# Patient Record
Sex: Female | Born: 1985 | Race: White | Hispanic: No | Marital: Married | Smoking: Former smoker
Health system: Southern US, Community
[De-identification: ages and names within clinical notes are randomized; demographics above are authoritative.]

## PROBLEM LIST (undated history)

## (undated) DIAGNOSIS — J0301 Acute recurrent streptococcal tonsillitis: Secondary | ICD-10-CM

## (undated) DIAGNOSIS — M1990 Unspecified osteoarthritis, unspecified site: Secondary | ICD-10-CM

## (undated) DIAGNOSIS — R102 Pelvic and perineal pain: Secondary | ICD-10-CM

## (undated) DIAGNOSIS — F419 Anxiety disorder, unspecified: Secondary | ICD-10-CM

## (undated) DIAGNOSIS — N200 Calculus of kidney: Secondary | ICD-10-CM

## (undated) HISTORY — DX: Calculus of kidney: N20.0

## (undated) HISTORY — DX: Anxiety disorder, unspecified: F41.9

## (undated) HISTORY — PX: WISDOM TOOTH EXTRACTION: SHX21

## (undated) HISTORY — DX: Acute recurrent streptococcal tonsillitis: J03.01

## (undated) HISTORY — DX: Unspecified osteoarthritis, unspecified site: M19.90

## (undated) HISTORY — DX: Pelvic and perineal pain: R10.2

---

## 2009-06-27 ENCOUNTER — Emergency Department (HOSPITAL_COMMUNITY): Admission: EM | Admit: 2009-06-27 | Discharge: 2009-06-27 | Payer: Self-pay | Admitting: Emergency Medicine

## 2009-08-28 ENCOUNTER — Emergency Department (HOSPITAL_COMMUNITY): Admission: EM | Admit: 2009-08-28 | Discharge: 2009-08-28 | Payer: Self-pay | Admitting: Family Medicine

## 2013-05-01 ENCOUNTER — Ambulatory Visit (INDEPENDENT_AMBULATORY_CARE_PROVIDER_SITE_OTHER): Payer: 59 | Admitting: Internal Medicine

## 2013-05-01 ENCOUNTER — Encounter: Payer: Self-pay | Admitting: Internal Medicine

## 2013-05-01 VITALS — BP 112/72 | HR 72 | Temp 99.0°F | Ht 66.0 in | Wt 146.0 lb

## 2013-05-01 DIAGNOSIS — Z0000 Encounter for general adult medical examination without abnormal findings: Secondary | ICD-10-CM

## 2013-05-01 DIAGNOSIS — N949 Unspecified condition associated with female genital organs and menstrual cycle: Secondary | ICD-10-CM

## 2013-05-01 DIAGNOSIS — R102 Pelvic and perineal pain unspecified side: Secondary | ICD-10-CM | POA: Insufficient documentation

## 2013-05-01 DIAGNOSIS — J020 Streptococcal pharyngitis: Secondary | ICD-10-CM | POA: Insufficient documentation

## 2013-05-01 MED ORDER — PENICILLIN V POTASSIUM 500 MG PO TABS: 500.0000 mg | ORAL_TABLET | Freq: Three times a day (TID) | ORAL | Status: DC

## 2013-05-01 MED ORDER — LEVONORGEST-ETH ESTRAD 91-DAY 0.15-0.03 &0.01 MG PO TABS: 1.0000 | ORAL_TABLET | Freq: Every day | ORAL | Status: DC

## 2013-05-01 MED ORDER — FLUCONAZOLE 150 MG PO TABS: 150.0000 mg | ORAL_TABLET | Freq: Every day | ORAL | Status: DC

## 2013-05-01 NOTE — Assessment & Plan Note (Addendum)
Symptoms and exam are consistent with recurrent strep pharyngitis. Will treat with penicillin. Patient reports frequent vaginal yeast infection after use of penicillin. Will treat with Diflucan as needed. Pt will call if symptoms are not improving.

## 2013-05-01 NOTE — Progress Notes (Signed)
Subjective:    Patient ID: Mackenzie Crawford, female    DOB: Nov 28, 1985, 27 y.o.   MRN: [id number]  HPI 27 year old female presents to establish care. She reports she is generally been healthy. She has had a long history of recurrent episodes of strep pharyngitis. Over the last couple of days she has developed sore throat, exudate, and tender lymph nodes in her neck. She denies any fever or chills. She denies nasal congestion, cough, headache. She has not taken any medication for this.  She also reports intermittent episodes of pelvic pain. Pelvic pain is described as intermittent shooting pain on both sides of her pelvis. It occurs right before her menses. It does not feel like menstrual cramps. She was evaluated by OB/GYN and reports having pelvic ultrasound which was normal. She was told her pain might be related to an ovarian cyst. She had been on oral contraceptives in the past but reports increased moodiness on this medication. She would like to try them again to see if any improvement in pelvic pain. She reports that her menstrual cycles are very heavy with large clots and heavy bleeding. Her periods are regular.  Outpatient Encounter Prescriptions as of 05/01/2013  Medication Sig Dispense Refill   No facility-administered encounter medications on file as of 05/01/2013.   BP 112/72  Pulse 72  Temp(Src) 99 F (37.2 C) (Oral)  Ht 5\' 6"  (1.676 m)  Wt 146 lb (66.225 kg)  BMI 23.58 kg/m2  SpO2 98%  LMP 04/26/2013  Review of Systems  Constitutional: Negative for fever, chills, appetite change, fatigue and unexpected weight change.  HENT: Positive for sore throat. Negative for ear pain, congestion, trouble swallowing, neck pain, voice change and sinus pressure.   Eyes: Negative for visual disturbance.  Respiratory: Negative for cough, shortness of breath, wheezing and stridor.   Cardiovascular: Negative for chest pain, palpitations and leg swelling.  Gastrointestinal: Negative for nausea,  vomiting, abdominal pain, diarrhea, constipation, blood in stool, abdominal distention and anal bleeding.  Genitourinary: Positive for menstrual problem and pelvic pain. Negative for dysuria, urgency, frequency, hematuria, flank pain, vaginal bleeding, vaginal discharge, genital sores and vaginal pain.  Musculoskeletal: Negative for myalgias, arthralgias and gait problem.  Skin: Negative for color change and rash.  Neurological: Negative for dizziness and headaches.  Hematological: Positive for adenopathy. Does not bruise/bleed easily.  Psychiatric/Behavioral: Negative for suicidal ideas, sleep disturbance and dysphoric mood. The patient is not nervous/anxious.        Objective:   Physical Exam  Constitutional: She is oriented to person, place, and time. She appears well-developed and well-nourished. No distress.  HENT:  Head: Normocephalic and atraumatic.  Right Ear: External ear normal.  Left Ear: External ear normal.  Nose: Nose normal.  Mouth/Throat: Mucous membranes are normal. Oropharyngeal exudate and posterior oropharyngeal erythema present. No tonsillar abscesses (tonsilar hypertrophy).  Eyes: Conjunctivae are normal. Pupils are equal, round, and reactive to light. Right eye exhibits no discharge. Left eye exhibits no discharge. No scleral icterus.  Neck: Normal range of motion. Neck supple. No tracheal deviation present. No thyromegaly present.  Cardiovascular: Normal rate, regular rhythm, normal heart sounds and intact distal pulses.  Exam reveals no gallop and no friction rub.   No murmur heard. Pulmonary/Chest: Effort normal and breath sounds normal. No accessory muscle usage. Not tachypneic. No respiratory distress. She has no decreased breath sounds. She has no wheezes. She has no rhonchi. She has no rales. She exhibits no tenderness.  Abdominal: Soft. Bowel sounds are normal. She  exhibits no distension and no mass. There is no tenderness. There is no rebound and no guarding.   Musculoskeletal: Normal range of motion. She exhibits no edema and no tenderness.  Lymphadenopathy:    She has cervical adenopathy (tender to palpation bilaterally).       Right cervical: Superficial cervical adenopathy present.       Left cervical: Superficial cervical adenopathy present.  Neurological: She is alert and oriented to person, place, and time. No cranial nerve deficit. She exhibits normal muscle tone. Coordination normal.  Skin: Skin is warm and dry. No rash noted. She is not diaphoretic. No erythema. No pallor.  Psychiatric: She has a normal mood and affect. Her behavior is normal. Judgment and thought content normal.          Assessment & Plan:   Outpatient Encounter Prescriptions as of 05/01/2013  Medication Sig Dispense Refill  . fluconazole (DIFLUCAN) 150 MG tablet Take 1 tablet (150 mg total) by mouth daily.  3 tablet  0  . Levonorgestrel-Ethinyl Estradiol (AMETHIA,CAMRESE) 0.15-0.03 &0.01 MG tablet Take 1 tablet by mouth daily.  1 Package  4  . penicillin v potassium (VEETID) 500 MG tablet Take 1 tablet (500 mg total) by mouth 3 (three) times daily.  30 tablet  0   No facility-administered encounter medications on file as of 05/01/2013.

## 2013-05-01 NOTE — Assessment & Plan Note (Signed)
Pelvic pain and heavy menstrual bleeding are concerning for endometriosis. Symptoms as described are not consistent with ovarian cyst. Will request records on previous pelvic ultrasound. Will start oral contraceptive. Patient will call if any recurrent pelvic pain. She will also call if persistent menorrhagia. Will check CBC with labs. Followup 4 weeks.

## 2013-05-05 ENCOUNTER — Other Ambulatory Visit (INDEPENDENT_AMBULATORY_CARE_PROVIDER_SITE_OTHER): Payer: 59

## 2013-05-05 ENCOUNTER — Encounter: Payer: Self-pay | Admitting: *Deleted

## 2013-05-05 DIAGNOSIS — Z0000 Encounter for general adult medical examination without abnormal findings: Secondary | ICD-10-CM

## 2013-05-05 LAB — CBC WITH DIFFERENTIAL/PLATELET
Eosinophils Relative: 5.1 % — ABNORMAL HIGH (ref 0.0–5.0)
HCT: 40.7 % (ref 36.0–46.0)
Hemoglobin: 13.6 g/dL (ref 12.0–15.0)
Lymphs Abs: 2.3 10*3/uL (ref 0.7–4.0)
MCV: 88.9 fl (ref 78.0–100.0)
Monocytes Absolute: 0.5 10*3/uL (ref 0.1–1.0)
Neutro Abs: 3.1 10*3/uL (ref 1.4–7.7)
Platelets: 213 10*3/uL (ref 150.0–400.0)
RDW: 13.8 % (ref 11.5–14.6)
WBC: 6.3 10*3/uL (ref 4.5–10.5)

## 2013-05-05 LAB — COMPREHENSIVE METABOLIC PANEL
Alkaline Phosphatase: 49 U/L (ref 39–117)
BUN: 16 mg/dL (ref 6–23)
Glucose, Bld: 86 mg/dL (ref 70–99)
Total Bilirubin: 0.7 mg/dL (ref 0.3–1.2)

## 2013-05-05 LAB — LIPID PANEL
HDL: 79.9 mg/dL (ref >39.00)
Triglycerides: 69 mg/dL (ref 0.0–149.0)

## 2013-05-05 LAB — TSH: TSH: 2.91 u[IU]/mL (ref 0.35–5.50)

## 2013-05-06 LAB — VITAMIN D 25 HYDROXY (VIT D DEFICIENCY, FRACTURES): Vit D, 25-Hydroxy: 70 ng/mL (ref 30–89)

## 2013-06-26 ENCOUNTER — Encounter: Payer: Self-pay | Admitting: Internal Medicine

## 2013-07-08 ENCOUNTER — Encounter: Payer: Self-pay | Admitting: Internal Medicine

## 2013-07-08 MED ORDER — ETONOGESTREL-ETHINYL ESTRADIOL 0.12-0.015 MG/24HR VA RING: VAGINAL_RING | VAGINAL | Status: DC

## 2013-08-28 ENCOUNTER — Other Ambulatory Visit: Payer: Self-pay

## 2013-09-03 ENCOUNTER — Other Ambulatory Visit (HOSPITAL_COMMUNITY)
Admission: RE | Admit: 2013-09-03 | Discharge: 2013-09-03 | Disposition: A | Discharge status: Home / Self Care | Payer: 59 | Source: Ambulatory Visit | Attending: Obstetrics and Gynecology | Admitting: Obstetrics and Gynecology

## 2013-09-03 ENCOUNTER — Other Ambulatory Visit: Payer: Self-pay | Admitting: Obstetrics and Gynecology

## 2013-09-03 DIAGNOSIS — Z01419 Encounter for gynecological examination (general) (routine) without abnormal findings: Secondary | ICD-10-CM | POA: Insufficient documentation

## 2013-09-03 DIAGNOSIS — Z113 Encounter for screening for infections with a predominantly sexual mode of transmission: Secondary | ICD-10-CM | POA: Insufficient documentation

## 2013-10-23 HISTORY — PX: TONSILLECTOMY AND ADENOIDECTOMY: SUR1326

## 2013-11-06 ENCOUNTER — Other Ambulatory Visit: Payer: Self-pay | Admitting: Otolaryngology

## 2014-06-19 ENCOUNTER — Encounter: Payer: Self-pay | Admitting: Internal Medicine

## 2014-06-19 ENCOUNTER — Ambulatory Visit (INDEPENDENT_AMBULATORY_CARE_PROVIDER_SITE_OTHER): Payer: 59 | Admitting: Internal Medicine

## 2014-06-19 VITALS — BP 122/66 | HR 72 | Temp 98.5°F | Ht 66.0 in | Wt 146.8 lb

## 2014-06-19 DIAGNOSIS — F410 Panic disorder [episodic paroxysmal anxiety] without agoraphobia: Secondary | ICD-10-CM

## 2014-06-19 MED ORDER — IBUPROFEN 800 MG PO TABS
800.0000 mg | ORAL_TABLET | Freq: Three times a day (TID) | ORAL | Status: DC | PRN
Start: 2014-06-19 — End: 2016-03-27

## 2014-06-19 MED ORDER — ALPRAZOLAM 0.25 MG PO TABS: 0.2500 mg | ORAL_TABLET | Freq: Three times a day (TID) | ORAL | Status: DC | PRN

## 2014-06-19 NOTE — Progress Notes (Signed)
   Subjective:    Patient ID: Mackenzie Crawford, female    DOB: 11-23-1985, 28 y.o.   MRN: [id number]  HPI 28YO female presents for acute visit.  Anxiety - Over last 1 month or so. Leads to shortness of breath at times with heart racing. No previous history of anxiety or panic. Her mother has history of panic attacks. She has never taken medications for this.  Review of Systems  Constitutional: Negative for fever, chills, appetite change, fatigue and unexpected weight change.  Eyes: Negative for visual disturbance.  Respiratory: Positive for shortness of breath.   Cardiovascular: Positive for chest pain and palpitations. Negative for leg swelling.  Gastrointestinal: Negative for abdominal pain.  Skin: Negative for color change and rash.  Hematological: Negative for adenopathy. Does not bruise/bleed easily.  Psychiatric/Behavioral: Negative for suicidal ideas, sleep disturbance and dysphoric mood. The patient is nervous/anxious.        Objective:    BP 122/66  Pulse 72  Temp(Src) 98.5 F (36.9 C) (Oral)  Ht 5\' 6"  (1.676 m)  Wt 146 lb 12 oz (66.565 kg)  BMI 23.70 kg/m2  SpO2 98%  LMP 06/03/2014 Physical Exam  Constitutional: She is oriented to person, place, and time. She appears well-developed and well-nourished. No distress.  HENT:  Head: Normocephalic and atraumatic.  Right Ear: External ear normal.  Left Ear: External ear normal.  Nose: Nose normal.  Mouth/Throat: Oropharynx is clear and moist. No oropharyngeal exudate.  Eyes: Conjunctivae are normal. Pupils are equal, round, and reactive to light. Right eye exhibits no discharge. Left eye exhibits no discharge. No scleral icterus.  Neck: Normal range of motion. Neck supple. No tracheal deviation present. No thyromegaly present.  Cardiovascular: Normal rate, regular rhythm, normal heart sounds and intact distal pulses.  Exam reveals no gallop and no friction rub.   No murmur heard. Pulmonary/Chest: Effort normal and breath  sounds normal. No accessory muscle usage. Not tachypneic. No respiratory distress. She has no decreased breath sounds. She has no wheezes. She has no rhonchi. She has no rales. She exhibits no tenderness.  Musculoskeletal: Normal range of motion. She exhibits no edema and no tenderness.  Lymphadenopathy:    She has no cervical adenopathy.  Neurological: She is alert and oriented to person, place, and time. No cranial nerve deficit. She exhibits normal muscle tone. Coordination normal.  Skin: Skin is warm and dry. No rash noted. She is not diaphoretic. No erythema. No pallor.  Psychiatric: Her behavior is normal. Judgment and thought content normal. Her mood appears anxious.          Assessment & Plan:   Problem List Items Addressed This Visit     Unprioritized   Panic disorder - Primary     Symptoms are most consistent with panic disorder. We discussed options including SSRIs and benzodiazepines. We discussed risks and benefits of both. She does not feel that she can take a daily medication, would rather take a prn med. Will start prn Alprazolam. She will stay home from work today.  Follow up by email this afternoon and next week. Follow up visit in 4 weeks.  Over 9min of which >50% spent in face-to-face contact with patient discussing plan of care     Relevant Medications      ALPRAZolam  Duanne Moron) tablet   Other Relevant Orders      EKG 12-Lead       Return in about 4 weeks (around 07/17/2014) for Recheck.

## 2014-06-19 NOTE — Patient Instructions (Addendum)
Email later today or tomorrow with update on medication.  Panic Attacks Panic attacks are sudden, short-livedsurges of severe anxiety, fear, or discomfort. They may occur for no reason when you are relaxed, when you are anxious, or when you are sleeping. Panic attacks may occur for a number of reasons:   Healthy people occasionally have panic attacks in extreme, life-threatening situations, such as war or natural disasters. Normal anxiety is a protective mechanism of the body that helps [place] react to danger (fight or flight response).  Panic attacks are often seen with anxiety disorders, such as panic disorder, social anxiety disorder, generalized anxiety disorder, and phobias. Anxiety disorders cause excessive or uncontrollable anxiety. They may interfere with your relationships or other life activities.  Panic attacks are sometimes seen with other mental illnesses, such as depression and posttraumatic stress disorder.  Certain medical conditions, prescription medicines, and drugs of abuse can cause panic attacks. SYMPTOMS  Panic attacks start suddenly, peak within 20 minutes, and are accompanied by four or more of the following symptoms:  Pounding heart or fast heart rate (palpitations).  Sweating.  Trembling or shaking.  Shortness of breath or feeling smothered.  Feeling choked.  Chest pain or discomfort.  Nausea or strange feeling in your stomach.  Dizziness, light-headedness, or feeling like you will faint.  Chills or hot flushes.  Numbness or tingling in your lips or hands and feet.  Feeling that things are not real or feeling that you are not yourself.  Fear of losing control or going crazy.  Fear of dying. Some of these symptoms can mimic serious medical conditions. For example, you may think you are having a heart attack. Although panic attacks can be very scary, they are not life threatening. DIAGNOSIS  Panic attacks are diagnosed through an assessment by your  health care provider. Your health care provider will ask questions about your symptoms, such as where and when they occurred. Your health care provider will also ask about your medical history and use of alcohol and drugs, including prescription medicines. Your health care provider may order blood tests or other studies to rule out a serious medical condition. Your health care provider may refer you to a mental health professional for further evaluation. TREATMENT   Most healthy people who have one or two panic attacks in an extreme, life-threatening situation will not require treatment.  The treatment for panic attacks associated with anxiety disorders or other mental illness typically involves counseling with a mental health professional, medicine, or a combination of both. Your health care provider will help determine what treatment is best for you.  Panic attacks due to physical illness usually go away with treatment of the illness. If prescription medicine is causing panic attacks, talk with your health care provider about stopping the medicine, decreasing the dose, or substituting another medicine.  Panic attacks due to alcohol or drug abuse go away with abstinence. Some adults need professional help in order to stop drinking or using drugs. HOME CARE INSTRUCTIONS   Take all medicines as directed by your health care provider.   Schedule and attend follow-up visits as directed by your health care provider. It is important to keep all your appointments. SEEK MEDICAL CARE IF:  You are not able to take your medicines as prescribed.  Your symptoms do not improve or get worse. SEEK IMMEDIATE MEDICAL CARE IF:   You experience panic attack symptoms that are different than your usual symptoms.  You have serious thoughts about hurting yourself or  others.  You are taking medicine for panic attacks and have a serious side effect. MAKE SURE YOU:  Understand these instructions.  Will watch  your condition.  Will get help right away if you are not doing well or get worse. Document Released: 10/09/2005 Document Revised: 10/14/2013 Document Reviewed: 05/23/2013 [facility] Patient Information 2015 [place], [place]. This information is not intended to replace advice given to you by your health care provider. Make sure you discuss any questions you have with your health care provider.

## 2014-06-19 NOTE — Progress Notes (Signed)
Pre visit review using our clinic review tool, if applicable. No additional management support is needed unless otherwise documented below in the visit note. 

## 2014-06-19 NOTE — Assessment & Plan Note (Signed)
Symptoms are most consistent with panic disorder. We discussed options including SSRIs and benzodiazepines. We discussed risks and benefits of both. She does not feel that she can take a daily medication, would rather take a prn med. Will start prn Alprazolam. She will stay home from work today.  Follow up by email this afternoon and next week. Follow up visit in 4 weeks.  Over 57min of which >50% spent in face-to-face contact with patient discussing plan of care

## 2014-06-22 ENCOUNTER — Encounter: Payer: Self-pay | Admitting: Internal Medicine

## 2014-06-22 MED ORDER — SERTRALINE HCL 25 MG PO TABS: 25.0000 mg | ORAL_TABLET | Freq: Every day | ORAL | Status: DC

## 2014-06-22 NOTE — Telephone Encounter (Signed)
Follow up.

## 2014-07-01 ENCOUNTER — Encounter: Payer: Self-pay | Admitting: Internal Medicine

## 2014-07-22 ENCOUNTER — Ambulatory Visit (INDEPENDENT_AMBULATORY_CARE_PROVIDER_SITE_OTHER): Payer: 59 | Admitting: Internal Medicine

## 2014-07-22 ENCOUNTER — Encounter: Payer: Self-pay | Admitting: Internal Medicine

## 2014-07-22 VITALS — BP 120/80 | HR 77 | Temp 98.4°F | Ht 66.0 in | Wt 143.8 lb

## 2014-07-22 DIAGNOSIS — F410 Panic disorder [episodic paroxysmal anxiety] without agoraphobia: Secondary | ICD-10-CM

## 2014-07-22 NOTE — Progress Notes (Signed)
Pre visit review using our clinic review tool, if applicable. No additional management support is needed unless otherwise documented below in the visit note. 

## 2014-07-22 NOTE — Progress Notes (Signed)
   Subjective:    Patient ID: Mackenzie Crawford, female    DOB: Mar 10, 1986, 28 y.o.   MRN: [id number]  HPI 28YO female presents for follow up.  Recently started on prn Alprazolam and daily Sertraline 25mg  for anxiety. No improvement noted with the Sertraline, however does note improvement with use of Alprazolam. Taking prn alprazolam during day for panic attacks and at bedtime to help with sleep. Notes improvement in sleep. No daytime drowsiness. No new concerns today.  Review of Systems  Constitutional: Negative for fever, chills, appetite change, fatigue and unexpected weight change.  Eyes: Negative for visual disturbance.  Respiratory: Negative for shortness of breath.   Cardiovascular: Negative for chest pain and leg swelling.  Gastrointestinal: Negative for abdominal pain.  Skin: Negative for color change and rash.  Hematological: Negative for adenopathy. Does not bruise/bleed easily.  Psychiatric/Behavioral: Negative for sleep disturbance and dysphoric mood. The patient is nervous/anxious.        Objective:    BP 120/80  Pulse 77  Temp(Src) 98.4 F (36.9 C) (Oral)  Ht 5\' 6"  (1.676 m)  Wt 143 lb 12 oz (65.205 kg)  BMI 23.21 kg/m2  SpO2 97%  LMP 06/29/2014 Physical Exam  Constitutional: She is oriented to person, place, and time. She appears well-developed and well-nourished. No distress.  HENT:  Head: Normocephalic and atraumatic.  Right Ear: External ear normal.  Left Ear: External ear normal.  Nose: Nose normal.  Mouth/Throat: Oropharynx is clear and moist. No oropharyngeal exudate.  Eyes: Conjunctivae are normal. Pupils are equal, round, and reactive to light. Right eye exhibits no discharge. Left eye exhibits no discharge. No scleral icterus.  Neck: Normal range of motion. Neck supple. No tracheal deviation present. No thyromegaly present.  Cardiovascular: Normal rate, regular rhythm, normal heart sounds and intact distal pulses.  Exam reveals no gallop and no friction  rub.   No murmur heard. Pulmonary/Chest: Effort normal and breath sounds normal. No accessory muscle usage. Not tachypneic. No respiratory distress. She has no decreased breath sounds. She has no wheezes. She has no rhonchi. She has no rales. She exhibits no tenderness.  Musculoskeletal: Normal range of motion. She exhibits no edema and no tenderness.  Lymphadenopathy:    She has no cervical adenopathy.  Neurological: She is alert and oriented to person, place, and time. No cranial nerve deficit. She exhibits normal muscle tone. Coordination normal.  Skin: Skin is warm and dry. No rash noted. She is not diaphoretic. No erythema. No pallor.  Psychiatric: She has a normal mood and affect. Her speech is normal and behavior is normal. Judgment and thought content normal. Her mood appears not anxious. She does not exhibit a depressed mood.          Assessment & Plan:   Problem List Items Addressed This Visit     Unprioritized   Panic disorder - Primary     Symptoms improved with prn Alprazolam. She prefers not to take a daily medication, so will stop Sertraline. Follow up in 87months or sooner as needed.        Return in about 3 months (around 10/21/2014) for Recheck.

## 2014-07-22 NOTE — Patient Instructions (Signed)
Continue Alprazolam as needed.  Follow up in 3 months or sooner as needed.

## 2014-07-22 NOTE — Assessment & Plan Note (Signed)
Symptoms improved with prn Alprazolam. She prefers not to take a daily medication, so will stop Sertraline. Follow up in 40months or sooner as needed.

## 2014-08-03 ENCOUNTER — Encounter: Payer: Self-pay | Admitting: Internal Medicine

## 2014-08-03 MED ORDER — ETONOGESTREL-ETHINYL ESTRADIOL 0.12-0.015 MG/24HR VA RING: VAGINAL_RING | VAGINAL | Status: DC

## 2014-09-02 ENCOUNTER — Other Ambulatory Visit: Payer: Self-pay | Admitting: Obstetrics and Gynecology

## 2014-09-02 ENCOUNTER — Other Ambulatory Visit (HOSPITAL_COMMUNITY)
Admission: RE | Admit: 2014-09-02 | Discharge: 2014-09-02 | Disposition: A | Discharge status: Home / Self Care | Payer: 59 | Source: Ambulatory Visit | Attending: Obstetrics and Gynecology | Admitting: Obstetrics and Gynecology

## 2014-09-02 DIAGNOSIS — Z01419 Encounter for gynecological examination (general) (routine) without abnormal findings: Secondary | ICD-10-CM | POA: Insufficient documentation

## 2014-09-03 LAB — CYTOLOGY - PAP

## 2014-09-14 ENCOUNTER — Encounter: Payer: Self-pay | Admitting: Internal Medicine

## 2014-10-07 ENCOUNTER — Ambulatory Visit: Payer: Self-pay | Admitting: Internal Medicine

## 2014-10-27 ENCOUNTER — Encounter: Payer: Self-pay | Admitting: Internal Medicine

## 2015-07-27 ENCOUNTER — Encounter: Payer: Self-pay | Admitting: Internal Medicine

## 2015-07-28 ENCOUNTER — Telehealth: Payer: Self-pay | Admitting: Internal Medicine

## 2015-07-28 NOTE — Telephone Encounter (Signed)
Good morning! Dr Gilford Rile. I received your message to schedule Ms Mooty, I do not see anything available to schedule. Can you please let me know where I can schedule her? Pt did not answer the phone I left a vm for her to call me. Thank You!

## 2015-07-28 NOTE — Telephone Encounter (Signed)
OK. Thank You! Pt scheduled.

## 2015-07-28 NOTE — Telephone Encounter (Signed)
Friday at 1pm for 32min

## 2015-07-30 ENCOUNTER — Ambulatory Visit: Payer: Self-pay | Admitting: Internal Medicine

## 2015-09-09 ENCOUNTER — Other Ambulatory Visit: Payer: Self-pay | Admitting: Nurse Practitioner

## 2015-09-09 ENCOUNTER — Other Ambulatory Visit (HOSPITAL_COMMUNITY)
Admission: RE | Admit: 2015-09-09 | Discharge: 2015-09-09 | Disposition: A | Discharge status: Home / Self Care | Payer: 59 | Source: Ambulatory Visit | Attending: Nurse Practitioner | Admitting: Nurse Practitioner

## 2015-09-09 DIAGNOSIS — Z113 Encounter for screening for infections with a predominantly sexual mode of transmission: Secondary | ICD-10-CM | POA: Insufficient documentation

## 2015-09-09 DIAGNOSIS — Z01419 Encounter for gynecological examination (general) (routine) without abnormal findings: Secondary | ICD-10-CM | POA: Insufficient documentation

## 2015-09-13 LAB — CYTOLOGY - PAP

## 2015-10-18 ENCOUNTER — Encounter: Payer: Self-pay | Admitting: Internal Medicine

## 2015-10-19 ENCOUNTER — Other Ambulatory Visit: Payer: Self-pay | Admitting: Internal Medicine

## 2015-10-19 NOTE — Telephone Encounter (Signed)
Please advise? The last time patient was seen was in September 2015.

## 2016-03-27 ENCOUNTER — Encounter: Payer: Self-pay | Admitting: Internal Medicine

## 2016-03-27 ENCOUNTER — Ambulatory Visit: Payer: Self-pay | Admitting: Internal Medicine

## 2016-03-27 ENCOUNTER — Ambulatory Visit (INDEPENDENT_AMBULATORY_CARE_PROVIDER_SITE_OTHER)
Admission: RE | Admit: 2016-03-27 | Discharge: 2016-03-27 | Disposition: A | Discharge status: Home / Self Care | Payer: Managed Care, Other (non HMO) | Source: Ambulatory Visit | Attending: Internal Medicine | Admitting: Internal Medicine

## 2016-03-27 ENCOUNTER — Ambulatory Visit (INDEPENDENT_AMBULATORY_CARE_PROVIDER_SITE_OTHER): Payer: Managed Care, Other (non HMO) | Admitting: Internal Medicine

## 2016-03-27 VITALS — BP 122/78 | HR 66 | Ht 65.5 in | Wt 150.4 lb

## 2016-03-27 DIAGNOSIS — Z0001 Encounter for general adult medical examination with abnormal findings: Secondary | ICD-10-CM

## 2016-03-27 DIAGNOSIS — M25552 Pain in left hip: Secondary | ICD-10-CM | POA: Insufficient documentation

## 2016-03-27 DIAGNOSIS — Z0000 Encounter for general adult medical examination without abnormal findings: Secondary | ICD-10-CM | POA: Diagnosis not present

## 2016-03-27 DIAGNOSIS — S43005S Unspecified dislocation of left shoulder joint, sequela: Secondary | ICD-10-CM | POA: Diagnosis not present

## 2016-03-27 DIAGNOSIS — S43006A Unspecified dislocation of unspecified shoulder joint, initial encounter: Secondary | ICD-10-CM | POA: Insufficient documentation

## 2016-03-27 DIAGNOSIS — Z3200 Encounter for pregnancy test, result unknown: Secondary | ICD-10-CM

## 2016-03-27 LAB — COMPREHENSIVE METABOLIC PANEL
ALBUMIN: 4.5 g/dL (ref 3.5–5.2)
ALK PHOS: 58 U/L (ref 39–117)
ALT: 16 U/L (ref 0–35)
AST: 22 U/L (ref 0–37)
BUN: 14 mg/dL (ref 6–23)
CALCIUM: 9.4 mg/dL (ref 8.4–10.5)
CO2: 28 mEq/L (ref 19–32)
CREATININE: 0.79 mg/dL (ref 0.40–1.20)
Chloride: 103 mEq/L (ref 96–112)
GFR: 92.16 mL/min (ref >60.00)
Glucose, Bld: 95 mg/dL (ref 70–99)
POTASSIUM: 4.3 meq/L (ref 3.5–5.1)
SODIUM: 138 meq/L (ref 135–145)
TOTAL PROTEIN: 7 g/dL (ref 6.0–8.3)
Total Bilirubin: 0.5 mg/dL (ref 0.2–1.2)

## 2016-03-27 LAB — VITAMIN D 25 HYDROXY (VIT D DEFICIENCY, FRACTURES): VITD: 50.16 ng/mL (ref 30.00–100.00)

## 2016-03-27 LAB — LIPID PANEL
CHOLESTEROL: 166 mg/dL (ref 0–200)
HDL: 78.6 mg/dL (ref >39.00)
LDL Cholesterol: 76 mg/dL (ref 0–99)
NonHDL: 86.97
TRIGLYCERIDES: 57 mg/dL (ref 0.0–149.0)
Total CHOL/HDL Ratio: 2
VLDL: 11.4 mg/dL (ref 0.0–40.0)

## 2016-03-27 LAB — CBC WITH DIFFERENTIAL/PLATELET
BASOS PCT: 0.7 % (ref 0.0–3.0)
Basophils Absolute: 0 10*3/uL (ref 0.0–0.1)
EOS PCT: 4.3 % (ref 0.0–5.0)
Eosinophils Absolute: 0.3 10*3/uL (ref 0.0–0.7)
HEMATOCRIT: 38.8 % (ref 36.0–46.0)
HEMOGLOBIN: 12.8 g/dL (ref 12.0–15.0)
LYMPHS PCT: 36.5 % (ref 12.0–46.0)
Lymphs Abs: 2.3 10*3/uL (ref 0.7–4.0)
MCHC: 33 g/dL (ref 30.0–36.0)
MCV: 85.6 fl (ref 78.0–100.0)
MONO ABS: 0.6 10*3/uL (ref 0.1–1.0)
Monocytes Relative: 8.9 % (ref 3.0–12.0)
Neutro Abs: 3.1 10*3/uL (ref 1.4–7.7)
Neutrophils Relative %: 49.6 % (ref 43.0–77.0)
Platelets: 239 10*3/uL (ref 150.0–400.0)
RBC: 4.53 Mil/uL (ref 3.87–5.11)
RDW: 14.1 % (ref 11.5–15.5)
WBC: 6.2 10*3/uL (ref 4.0–10.5)

## 2016-03-27 LAB — TSH: TSH: 1.29 u[IU]/mL (ref 0.35–4.50)

## 2016-03-27 LAB — POCT URINE PREGNANCY: Preg Test, Ur: NEGATIVE

## 2016-03-27 MED ORDER — IBUPROFEN 800 MG PO TABS: 800.0000 mg | ORAL_TABLET | Freq: Three times a day (TID) | ORAL | Status: DC | PRN

## 2016-03-27 NOTE — Assessment & Plan Note (Signed)
Intermittent dislocation of left shoulder. Will set up ortho evaluation.

## 2016-03-27 NOTE — Progress Notes (Signed)
Pre visit review using our clinic review tool, if applicable. No additional management support is needed unless otherwise documented below in the visit note. 

## 2016-03-27 NOTE — Assessment & Plan Note (Signed)
Posterior left hip pain after accident several years ago. Will get plain xray of left hip. Consider ortho evaluation if persistent pain.
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
## 2016-03-27 NOTE — Patient Instructions (Addendum)
Xray today at [facility].  We will set up ortho evaluation.  Health Maintenance, Female Adopting a healthy lifestyle and getting preventive care can go a long way to promote health and wellness. Talk with your health care provider about what schedule of regular examinations is right for you. This is a good chance for you to check in with your provider about disease prevention and staying healthy. In between checkups, there are plenty of things you can do on your own. Experts have done a lot of research about which lifestyle changes and preventive measures are most likely to keep you healthy. Ask your health care provider for more information. WEIGHT AND DIET  Eat a healthy diet  Be sure to include plenty of vegetables, fruits, low-fat dairy products, and lean protein.  Do not eat a lot of foods high in solid fats, added sugars, or salt.  Get regular exercise. This is one of the most important things you can do for your health.  Most adults should exercise for at least 150 minutes each week. The exercise should increase your heart rate and make you sweat (moderate-intensity exercise).  Most adults should also do strengthening exercises at least twice a week. This is in addition to the moderate-intensity exercise.  Maintain a healthy weight  Body mass index (BMI) is a measurement that can be used to identify possible weight problems. It estimates body fat based on height and weight. Your health care provider can help determine your BMI and help you achieve or maintain a healthy weight.  For females 30 years of age and older:   A BMI below 18.5 is considered underweight.  A BMI of 18.5 to 24.9 is normal.  A BMI of 25 to 29.9 is considered overweight.  A BMI of 30 and above is considered obese.  Watch levels of cholesterol and blood lipids  You should start having your blood tested for lipids and cholesterol at 28 years of age, then have this test every 5 years.  You may need to  have your cholesterol levels checked more often if:  Your lipid or cholesterol levels are high.  You are older than 28 years of age.  You are at high risk for heart disease.  CANCER SCREENING   Lung Cancer  Lung cancer screening is recommended for adults 49-31 years old who are at high risk for lung cancer because of a history of smoking.  A yearly low-dose CT scan of the lungs is recommended for people who:  Currently smoke.  Have quit within the past 15 years.  Have at least a 30-pack-year history of smoking. A pack year is smoking an average of one pack of cigarettes a day for 1 year.  Yearly screening should continue until it has been 15 years since you quit.  Yearly screening should stop if you develop a health problem that would prevent you from having lung cancer treatment.  Breast Cancer  Practice breast self-awareness. This means understanding how your breasts normally appear and feel.  It also means doing regular breast self-exams. Let your health care provider know about any changes, no matter how small.  If you are in your 20s or 30s, you should have a clinical breast exam (CBE) by a health care provider every 1-3 years as part of a regular health exam.  If you are 74 or older, have a CBE every year. Also consider having a breast X-ray (mammogram) every year.  If you have a family history of breast  cancer, talk to your health care provider about genetic screening.  If you are at high risk for breast cancer, talk to your health care provider about having an MRI and a mammogram every year.  Breast cancer gene (BRCA) assessment is recommended for women who have family members with BRCA-related cancers. BRCA-related cancers include:  Breast.  Ovarian.  Tubal.  Peritoneal cancers.  Results of the assessment will determine the need for genetic counseling and BRCA1 and BRCA2 testing. Cervical Cancer Your health care provider may recommend that you be screened  regularly for cancer of the pelvic organs (ovaries, uterus, and vagina). This screening involves a pelvic examination, including checking for microscopic changes to the surface of your cervix (Pap test). You may be encouraged to have this screening done every 3 years, beginning at age 62.  For women ages 67-65, health care providers may recommend pelvic exams and Pap testing every 3 years, or they may recommend the Pap and pelvic exam, combined with testing for human papilloma virus (HPV), every 5 years. Some types of HPV increase your risk of cervical cancer. Testing for HPV may also be done on women of any age with unclear Pap test results.  Other health care providers may not recommend any screening for nonpregnant women who are considered low risk for pelvic cancer and who do not have symptoms. Ask your health care provider if a screening pelvic exam is right for you.  If you have had past treatment for cervical cancer or a condition that could lead to cancer, you need Pap tests and screening for cancer for at least 20 years after your treatment. If Pap tests have been discontinued, your risk factors (such as having a new sexual partner) need to be reassessed to determine if screening should resume. Some women have medical problems that increase the chance of getting cervical cancer. In these cases, your health care provider may recommend more frequent screening and Pap tests. Colorectal Cancer  This type of cancer can be detected and often prevented.  Routine colorectal cancer screening usually begins at 28 years of age and continues through 28 years of age.  Your health care provider may recommend screening at an earlier age if you have risk factors for colon cancer.  Your health care provider may also recommend using home test kits to check for hidden blood in the stool.  A small camera at the end of a tube can be used to examine your colon directly (sigmoidoscopy or colonoscopy). This is  done to check for the earliest forms of colorectal cancer.  Routine screening usually begins at age 53.  Direct examination of the colon should be repeated every 5-10 years through 28 years of age. However, you may need to be screened more often if early forms of precancerous polyps or small growths are found. Skin Cancer  Check your skin from head to toe regularly.  Tell your health care provider about any new moles or changes in moles, especially if there is a change in a mole's shape or color.  Also tell your health care provider if you have a mole that is larger than the size of a pencil eraser.  Always use sunscreen. Apply sunscreen liberally and repeatedly throughout the day.  Protect yourself by wearing long sleeves, pants, a wide-brimmed hat, and sunglasses whenever you are outside. HEART DISEASE, DIABETES, AND HIGH BLOOD PRESSURE   High blood pressure causes heart disease and increases the risk of stroke. High blood pressure is more likely  to develop in:  People who have blood pressure in the high end of the normal range (130-139/85-89 mm Hg).  People who are overweight or obese.  People who are African American.  If you are 61-74 years of age, have your blood pressure checked every 3-5 years. If you are 31 years of age or older, have your blood pressure checked every year. You should have your blood pressure measured twice--once when you are at a hospital or clinic, and once when you are not at a hospital or clinic. Record the average of the two measurements. To check your blood pressure when you are not at a hospital or clinic, you can use:  An automated blood pressure machine at a pharmacy.  A home blood pressure monitor.  If you are between 57 years and 38 years old, ask your health care provider if you should take aspirin to prevent strokes.  Have regular diabetes screenings. This involves taking a blood sample to check your fasting blood sugar level.  If you are at  a normal weight and have a low risk for diabetes, have this test once every three years after 28 years of age.  If you are overweight and have a high risk for diabetes, consider being tested at a younger age or more often. PREVENTING INFECTION  Hepatitis B  If you have a higher risk for hepatitis B, you should be screened for this virus. You are considered at high risk for hepatitis B if:  You were born in a country where hepatitis B is common. Ask your health care provider which countries are considered high risk.  Your parents were born in a high-risk country, and you have not been immunized against hepatitis B (hepatitis B vaccine).  You have HIV or AIDS.  You use needles to inject street drugs.  You live with someone who has hepatitis B.  You have had sex with someone who has hepatitis B.  You get hemodialysis treatment.  You take certain medicines for conditions, including cancer, organ transplantation, and autoimmune conditions. Hepatitis C  Blood testing is recommended for:  Everyone born from [birth year] through 1963.  Anyone with known risk factors for hepatitis C. Sexually transmitted infections (STIs)  You should be screened for sexually transmitted infections (STIs) including gonorrhea and chlamydia if:  You are sexually active and are younger than 28 years of age.  You are older than 28 years of age and your health care provider tells you that you are at risk for this type of infection.  Your sexual activity has changed since you were last screened and you are at an increased risk for chlamydia or gonorrhea. Ask your health care provider if you are at risk.  If you do not have HIV, but are at risk, it may be recommended that you take a prescription medicine daily to prevent HIV infection. This is called pre-exposure prophylaxis (PrEP). You are considered at risk if:  You are sexually active and do not regularly use condoms or know the HIV status of your  partner(s).  You take drugs by injection.  You are sexually active with a partner who has HIV. Talk with your health care provider about whether you are at high risk of being infected with HIV. If you choose to begin PrEP, you should first be tested for HIV. You should then be tested every 3 months for as long as you are taking PrEP.  PREGNANCY   If you are premenopausal and you may become  pregnant, ask your health care provider about preconception counseling.  If you may become pregnant, take 400 to 800 micrograms (mcg) of folic acid every day.  If you want to prevent pregnancy, talk to your health care provider about birth control (contraception). OSTEOPOROSIS AND MENOPAUSE   Osteoporosis is a disease in which the bones lose minerals and strength with aging. This can result in serious bone fractures. Your risk for osteoporosis can be identified using a bone density scan.  If you are 27 years of age or older, or if you are at risk for osteoporosis and fractures, ask your health care provider if you should be screened.  Ask your health care provider whether you should take a calcium or vitamin D supplement to lower your risk for osteoporosis.  Menopause may have certain physical symptoms and risks.  Hormone replacement therapy may reduce some of these symptoms and risks. Talk to your health care provider about whether hormone replacement therapy is right for you.  HOME CARE INSTRUCTIONS   Schedule regular health, dental, and eye exams.  Stay current with your immunizations.   Do not use any tobacco products including cigarettes, chewing tobacco, or electronic cigarettes.  If you are pregnant, do not drink alcohol.  If you are breastfeeding, limit how much and how often you drink alcohol.  Limit alcohol intake to no more than 1 drink per day for nonpregnant women. One drink equals 12 ounces of beer, 5 ounces of wine, or 1 ounces of hard liquor.  Do not use street drugs.  Do  not share needles.  Ask your health care provider for help if you need support or information about quitting drugs.  Tell your health care provider if you often feel depressed.  Tell your health care provider if you have ever been abused or do not feel safe at home.   This information is not intended to replace advice given to you by your health care provider. Make sure you discuss any questions you have with your health care provider.   Document Released: 04/24/2011 Document Revised: 10/30/2014 Document Reviewed: 09/10/2013 Elsevier Interactive Patient Education [employer].

## 2016-03-27 NOTE — Assessment & Plan Note (Signed)
General medical exam normal today. Breast and pelvic exam deferred as recently completed by OB. PAP reviewed and was normal. Labs today. Encouraged healthy diet and exercise.

## 2016-06-28 ENCOUNTER — Ambulatory Visit: Payer: Managed Care, Other (non HMO) | Admitting: Internal Medicine

## 2016-11-16 ENCOUNTER — Ambulatory Visit (INDEPENDENT_AMBULATORY_CARE_PROVIDER_SITE_OTHER): Payer: Managed Care, Other (non HMO) | Admitting: Family Medicine

## 2016-11-16 ENCOUNTER — Encounter: Payer: Self-pay | Admitting: Family Medicine

## 2016-11-16 VITALS — BP 129/81 | HR 73 | Temp 98.2°F | Wt 150.6 lb

## 2016-11-16 DIAGNOSIS — D229 Melanocytic nevi, unspecified: Secondary | ICD-10-CM | POA: Insufficient documentation

## 2016-11-16 DIAGNOSIS — F410 Panic disorder [episodic paroxysmal anxiety] without agoraphobia: Secondary | ICD-10-CM | POA: Diagnosis not present

## 2016-11-16 MED ORDER — CITALOPRAM HYDROBROMIDE 20 MG PO TABS: 20.0000 mg | ORAL_TABLET | Freq: Every day | ORAL | 3 refills | Status: DC

## 2016-11-16 NOTE — Assessment & Plan Note (Signed)
Establish from, worsening. Discussed medication options as well as seeing a counselor/psychologist.  Starting Celexa. Follow-up in 6 weeks.

## 2016-11-16 NOTE — Progress Notes (Signed)
Subjective:  Patient ID: Mackenzie Crawford, female    DOB: Sep 26, 1986  Age: 31 y.o. MRN: [id number]  CC: Anxiety, mole  HPI:  31 year old female with a history of anxiety/panic disorder presents with complaints of worsening anxiety. She also has a mole that she wants examined.  Anxiety  Patient states that her anxiety has been worsening as of late.  She states that she has a lot of her plate.  She is working full-time and going to school and is also planning a wedding.  She states that she is overwhelmed. This leads to increasing anxiety and inability to focus.  She has previously been on Zoloft and had side effects per her report. She currently takes Xanax as needed but states that this is not ideal as it makes her sedated.  She like to discuss treatment options today.  Mole  Patient reports a mole on her left hand (palm - ulnar side).  She states that it has been enlarging over the past 2 years.  No color change.  She is concerned that this may be cancer and wants it examined today.  Social Hx   Social History   Social History  . Marital status: Single    Spouse name: N/A  . Number of children: N/A  . Years of education: N/A   Social History Main Topics  . Smoking status: Former Smoker    Quit date: 11/01/2009  . Smokeless tobacco: Never Used  . Alcohol use 1.0 oz/week    2 drink(s) per week  . Drug use: Yes  . Sexual activity: Not Currently   Other Topics Concern  . None   Social History Narrative   Lives in [place].   From [place].      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   Psychiatric/Behavioral: The patient is nervous/anxious.    Objective:  BP 129/81   Pulse 73   Temp 98.2 F (36.8 C) (Oral)   Wt 150 lb 9.6 oz (68.3 kg)   SpO2 100%   BMI 24.68 kg/m   BP/Weight 11/16/2016 03/27/2016 [id number]  Systolic  BP [id number]  Diastolic BP 81 78 80  Wt. (Lbs) 150.6 150.4 143.75  BMI 24.68 24.64 23.21    Physical Exam  Constitutional: She is oriented to person, place, and time. She appears well-developed. No distress.  Pulmonary/Chest: Effort normal.  Neurological: She is alert and oriented to person, place, and time.  Skin:  Left hand - palmar aspect; ulnar aspect just below the 5th MCP; small brown macule. No color change. Normal border. Symmetrical.  Psychiatric:  Very anxious and perseverative.   Vitals reviewed.  Lab Results  Component Value Date   WBC 6.2 03/27/2016   HGB 12.8 03/27/2016   HCT 38.8 03/27/2016   PLT 239.0 03/27/2016   GLUCOSE 95 03/27/2016   CHOL 166 03/27/2016   TRIG 57.0 03/27/2016   HDL 78.60 03/27/2016   LDLCALC 76 03/27/2016   ALT 16 03/27/2016   AST 22 03/27/2016   NA 138 03/27/2016   K 4.3 03/27/2016   CL 103 03/27/2016   CREATININE 0.79 03/27/2016   BUN 14 03/27/2016   CO2 28 03/27/2016   TSH 1.29 03/27/2016    Assessment & Plan:   Problem List Items Addressed  This Visit    Panic disorder - Primary    Establish from, worsening. Discussed medication options as well as seeing a counselor/psychologist.  Starting Celexa. Follow-up in 6 weeks.      Relevant Medications   citalopram (CELEXA) 20 MG tablet   Benign nevus    New problem. Exam consistent with a benign nevus. Reassurance provided.         Meds ordered this encounter  Medications  . citalopram (CELEXA) 20 MG tablet    Sig: Take 1 tablet (20 mg total) by mouth daily.    Dispense:  30 tablet    Refill:  3    Follow-up: Return in about 6 weeks (around 12/28/2016).  [place]

## 2016-11-16 NOTE — Progress Notes (Signed)
Pre visit review using our clinic review tool, if applicable. No additional management support is needed unless otherwise documented below in the visit note. 

## 2016-11-16 NOTE — Patient Instructions (Signed)
Start the celexa.  Consider seeing a counselor.  Follow up in 6 weeks.  Take care  Dr. Lacinda Axon

## 2016-11-16 NOTE — Assessment & Plan Note (Signed)
New problem. Exam consistent with a benign nevus. Reassurance provided.

## 2017-01-03 ENCOUNTER — Ambulatory Visit: Payer: Managed Care, Other (non HMO) | Admitting: Family Medicine
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
## 2017-12-14 ENCOUNTER — Encounter: Payer: Self-pay | Admitting: Internal Medicine

## 2017-12-14 ENCOUNTER — Ambulatory Visit: Payer: Managed Care, Other (non HMO) | Admitting: Internal Medicine

## 2017-12-14 VITALS — BP 120/70 | HR 69 | Temp 98.3°F

## 2017-12-14 DIAGNOSIS — H6690 Otitis media, unspecified, unspecified ear: Secondary | ICD-10-CM

## 2017-12-14 DIAGNOSIS — J029 Acute pharyngitis, unspecified: Secondary | ICD-10-CM

## 2017-12-14 MED ORDER — CIPROFLOXACIN-DEXAMETHASONE 0.3-0.1 % OT SUSP: 4.0000 [drp] | Freq: Two times a day (BID) | OTIC | 0 refills | Status: DC

## 2017-12-14 MED ORDER — FLUCONAZOLE 150 MG PO TABS: 150.0000 mg | ORAL_TABLET | Freq: Once | ORAL | 0 refills | Status: AC

## 2017-12-14 MED ORDER — LEVOFLOXACIN 750 MG PO TABS: 750.0000 mg | ORAL_TABLET | Freq: Every day | ORAL | 0 refills | Status: DC

## 2017-12-14 MED ORDER — IBUPROFEN 800 MG PO TABS: 800.0000 mg | ORAL_TABLET | Freq: Three times a day (TID) | ORAL | 3 refills | Status: DC | PRN

## 2017-12-14 NOTE — Patient Instructions (Signed)
Try Ibuprofen, heat and warm rice pads to throat/neck  F/u as scheduled   Otitis Media, Adult Otitis media occurs when there is inflammation and fluid in the middle ear. Your middle ear is a part of the ear that contains bones for hearing as well as air that helps send sounds to your brain. What are the causes? This condition is caused by a blockage in the eustachian tube. This tube drains fluid from the ear to the back of the nose (nasopharynx). A blockage in this tube can be caused by an object or by swelling (edema) in the tube. Problems that can cause a blockage include:  A cold or other upper respiratory infection.  Allergies.  An irritant, such as tobacco smoke.  Enlarged adenoids. The adenoids are areas of soft tissue located high in the back of the throat, behind the nose and the roof of the mouth.  A mass in the nasopharynx.  Damage to the ear caused by pressure changes (barotrauma).  What are the signs or symptoms? Symptoms of this condition include:  Ear pain.  A fever.  Decreased hearing.  A headache.  Tiredness (lethargy).  Fluid leaking from the ear.  Ringing in the ear.  How is this diagnosed? This condition is diagnosed with a physical exam. During the exam your health care provider will use an instrument called an otoscope to look into your ear and check for redness, swelling, and fluid. He or she will also ask about your symptoms. Your health care provider may also order tests, such as:  A test to check the movement of the eardrum (pneumatic otoscopy). This test is done by squeezing a small amount of air into the ear.  A test that changes air pressure in the middle ear to check how well the eardrum moves and whether the eustachian tube is working (tympanogram).  How is this treated? This condition usually goes away on its own within 3-5 days. But if the condition is caused by a bacteria infection and does not go away own its own, or keeps coming back,  your health care provider may:  Prescribe antibiotic medicines to treat the infection.  Prescribe or recommend medicines to control pain.  Follow these instructions at home:  Take over-the-counter and prescription medicines only as told by your health care provider.  If you were prescribed an antibiotic medicine, take it as told by your health care provider. Do not stop taking the antibiotic even if you start to feel better.  Keep all follow-up visits as told by your health care provider. This is important. Contact a health care provider if:  You have bleeding from your nose.  There is a lump on your neck.  You are not getting better in 5 days.  You feel worse instead of better. Get help right away if:  You have severe pain that is not controlled with medicine.  You have swelling, redness, or pain around your ear.  You have stiffness in your neck.  A part of your face is paralyzed.  The bone behind your ear (mastoid) is tender when you touch it.  You develop a severe headache. Summary  Otitis media is redness, soreness, and swelling of the middle ear.  This condition usually goes away on its own within 3-5 days.  If the problem does not go away in 3-5 days, your health care provider may prescribe or recommend medicines to treat your symptoms.  If you were prescribed an antibiotic medicine, take it as  told by your health care provider. This information is not intended to replace advice given to you by your health care provider. Make sure you discuss any questions you have with your health care provider. Document Released: 07/14/2004 Document Revised: 09/29/2016 Document Reviewed: 09/29/2016 Elsevier Interactive Patient Education  [employer].

## 2017-12-14 NOTE — Progress Notes (Signed)
Pre visit review using our clinic review tool, if applicable. No additional management support is needed unless otherwise documented below in the visit note. 

## 2017-12-17 ENCOUNTER — Encounter: Payer: Self-pay | Admitting: Internal Medicine

## 2017-12-17 NOTE — Progress Notes (Signed)
Chief Complaint  Patient presents with  . Sore Throat  . Ear Pain   C/o sore throat and right ear pain. [facility] and [facility]nisone. But recently w/in last 3-4 days had sore throat and ear pain. She did have her tonsils removed 4-5 years ago in [place]. Denies fever. Nothing tried this time. Did not get flu shot declines.    Review of Systems  Constitutional: Negative for fever.  HENT: Positive for ear pain and sore throat.   Eyes: Negative for blurred vision.  Respiratory: Negative for shortness of breath.   Cardiovascular: Negative for chest pain.  Gastrointestinal: Negative for abdominal pain.  Skin: Negative for rash.   Past Medical History:  Diagnosis Date  . 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 Kidney stones   . Pelvic pain   . Recurrent streptococcal tonsillitis    Past Surgical History:  Procedure Laterality Date  . TONSILLECTOMY AND ADENOIDECTOMY  2015  . WISDOM TOOTH EXTRACTION     Family History  Problem Relation Age of Onset  . Kidney Stones Father   . Kidney Stones Brother   . Cancer Maternal Grandmother        pancreatic  . Heart disease Maternal Grandfather 43  . Arthritis Paternal Grandmother   . Diabetes Paternal Grandmother    Social History   Socioeconomic History  . Marital status: Single    Spouse name: Not on file  . Number of children: Not on file  . Years of education: Not on file  . Highest education level: Not on file  Social Needs  . Financial resource strain: Not on file  . Food insecurity - worry: Not on file  . Food insecurity - inability: Not on file  . Transportation needs - medical: Not on file  . Transportation needs - non-medical: Not on file  Occupational History  . Not on file  Tobacco Use  . Smoking status: Former Smoker    Last attempt to quit: 11/01/2009    Years since quitting: 8.1  . Smokeless tobacco: Never Used  Substance and Sexual Activity  . Alcohol use: Yes   Alcohol/week: 1.0 oz    Types: 2 drink(s) per week  . Drug use: Yes  . Sexual activity: Not Currently  Other Topics Concern  . Not on file  Social History Narrative   Lives in [place].   From [place].      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 ALPRAZolam (XANAX) 0.25 MG tablet Take 0.25 mg by mouth at bedtime as needed for anxiety.  . [DISCONTINUED] citalopram (CELEXA) 20 MG tablet Take 1 tablet (20 mg total) by mouth daily.   No Known Allergies No results found for this or any previous visit (from the past 2160 hour(s)). Objective  There is no height or weight on file to calculate BMI. Wt Readings from Last 3 Encounters:  11/16/16 150 lb 9.6 oz (68.3 kg)  03/27/16 150 lb 6.4 oz (68.2 kg)  07/22/14 143 lb 12 oz (65.2 kg)   Temp Readings from Last 3 Encounters:  12/14/17 98.3 F (36.8 C) (Oral)  11/16/16 98.2 F (36.8 C) (Oral)  07/22/14 98.4 F (36.9 C) (Oral)   BP Readings from Last 3 Encounters:  12/14/17 120/70  11/16/16 129/81  03/27/16 122/78   Pulse Readings from Last 3 Encounters:  12/14/17 69  11/16/16 73  03/27/16 66   O2 sat room air 99% Physical Exam  Constitutional: She is oriented to person, place, and time and well-developed, well-nourished, and in no distress. Vital signs are normal.  HENT:  Head: Normocephalic and atraumatic.  Right Ear: Hearing and external ear normal.  Mouth/Throat: Mucous membranes are normal. Posterior oropharyngeal erythema present.  Pain with otoscope right ear,  Redness in left ear   Eyes: Conjunctivae are normal. Pupils are equal, round, and reactive to light.  Cardiovascular: Normal rate, regular rhythm and normal heart sounds.  Pulmonary/Chest: Effort normal and breath sounds normal.  Neurological: She is alert and oriented to person, place, and time. Gait normal. Gait normal.  Skin:  Skin is warm, dry and intact.  Psychiatric: Mood, memory, affect and judgment normal.  Nursing note and vitals reviewed.   Assessment   1. Sore throat and ear pain with redness could be OM causing sore throat  Plan  1.  Rx levaquin qd x 1 week with diflucan ciprodex ear drops  If not better at f/u with PCP refer to ENT  Prn Ibuprofen, heat to neck likely reactive lyphadenodenopathy   Of note declines flu shot  Provider: Dr. Olivia Mackie McLean-Scocuzza-Internal Medicine

## 2017-12-21 ENCOUNTER — Ambulatory Visit: Payer: Managed Care, Other (non HMO) | Admitting: Family

## 2017-12-21 ENCOUNTER — Encounter: Payer: Self-pay | Admitting: Family

## 2017-12-21 VITALS — BP 110/82 | HR 99 | Temp 98.4°F | Resp 16 | Wt 153.5 lb

## 2017-12-21 DIAGNOSIS — F410 Panic disorder [episodic paroxysmal anxiety] without agoraphobia: Secondary | ICD-10-CM | POA: Diagnosis not present

## 2017-12-21 DIAGNOSIS — H6690 Otitis media, unspecified, unspecified ear: Secondary | ICD-10-CM

## 2017-12-21 DIAGNOSIS — N946 Dysmenorrhea, unspecified: Secondary | ICD-10-CM

## 2017-12-21 LAB — COMPREHENSIVE METABOLIC PANEL
ALBUMIN: 4.3 g/dL (ref 3.5–5.2)
ALT: 13 U/L (ref 0–35)
AST: 19 U/L (ref 0–37)
Alkaline Phosphatase: 47 U/L (ref 39–117)
BUN: 17 mg/dL (ref 6–23)
CHLORIDE: 102 meq/L (ref 96–112)
CO2: 30 meq/L (ref 19–32)
Calcium: 10 mg/dL (ref 8.4–10.5)
Creatinine, Ser: 0.86 mg/dL (ref 0.40–1.20)
GFR: 82.55 mL/min (ref >60.00)
Glucose, Bld: 86 mg/dL (ref 70–99)
POTASSIUM: 4.2 meq/L (ref 3.5–5.1)
SODIUM: 139 meq/L (ref 135–145)
Total Bilirubin: 0.5 mg/dL (ref 0.2–1.2)
Total Protein: 7.8 g/dL (ref 6.0–8.3)

## 2017-12-21 LAB — LIPID PANEL
Cholesterol: 181 mg/dL (ref 0–200)
HDL: 80.3 mg/dL (ref >39.00)
LDL CALC: 91 mg/dL (ref 0–99)
NONHDL: 101.1
Total CHOL/HDL Ratio: 2
Triglycerides: 53 mg/dL (ref 0.0–149.0)
VLDL: 10.6 mg/dL (ref 0.0–40.0)

## 2017-12-21 LAB — CBC WITH DIFFERENTIAL/PLATELET
BASOS PCT: 0.5 % (ref 0.0–3.0)
Basophils Absolute: 0 10*3/uL (ref 0.0–0.1)
EOS ABS: 0.1 10*3/uL (ref 0.0–0.7)
Eosinophils Relative: 1.7 % (ref 0.0–5.0)
HCT: 41.1 % (ref 36.0–46.0)
HEMOGLOBIN: 13.9 g/dL (ref 12.0–15.0)
LYMPHS ABS: 2.9 10*3/uL (ref 0.7–4.0)
Lymphocytes Relative: 33.5 % (ref 12.0–46.0)
MCHC: 33.8 g/dL (ref 30.0–36.0)
MCV: 87.1 fl (ref 78.0–100.0)
MONO ABS: 0.5 10*3/uL (ref 0.1–1.0)
Monocytes Relative: 5.9 % (ref 3.0–12.0)
NEUTROS ABS: 5 10*3/uL (ref 1.4–7.7)
NEUTROS PCT: 58.4 % (ref 43.0–77.0)
PLATELETS: 285 10*3/uL (ref 150.0–400.0)
RBC: 4.71 Mil/uL (ref 3.87–5.11)
RDW: 13.4 % (ref 11.5–15.5)
WBC: 8.5 10*3/uL (ref 4.0–10.5)

## 2017-12-21 LAB — TSH: TSH: 1.18 u[IU]/mL (ref 0.35–4.50)

## 2017-12-21 LAB — HEMOGLOBIN A1C: HEMOGLOBIN A1C: 5.3 % (ref 4.6–6.5)

## 2017-12-21 MED ORDER — IBUPROFEN 800 MG PO TABS: 800.0000 mg | ORAL_TABLET | Freq: Three times a day (TID) | ORAL | 3 refills | Status: DC | PRN

## 2017-12-21 NOTE — Assessment & Plan Note (Signed)
Improving.  Advised Flonase for any residual inflammation.  Long discussion regarding suspected reactive lymphadenopathy, patient understands to stay vigilant and she will let me know if not completely return to normal size in the next month to 6 weeks.

## 2017-12-21 NOTE — Patient Instructions (Addendum)
When you become pregnant, as discussed, do not take ibuprofen or xanax due to potential harm to baby.   GOOD luck and pleasure meeting you!  Labs

## 2017-12-21 NOTE — Assessment & Plan Note (Signed)
Doing very well. Not currently using. Advised not to use if became pregnant.

## 2017-12-21 NOTE — Progress Notes (Signed)
Subjective:    Patient ID: Mackenzie Crawford, female    DOB: [birth year], 32 y.o.   MRN: [id number]  CC: Mackenzie Crawford is a 32 y.o. female who presents today for follow up.   HPI: Here to establish care.  Trying to conceive for 3 months. Taking PNV. Non smoker.   Notes she has regular periods.  Occasionally she has heavy cramping with periods and she will take ibuprofen 800 mg.  She does not have it is often.  Last pap 2016 with GYN in [place].   Takes xanax prn for anxiety. Occasional glass of wine is helpful. Xanax has expired.  She tends to use situationally however has not needed in some time. Declines refill for today.   Right ear pain, is better. Finished levaquin, ciprodex. Lymphnodes have improved. No fever, chills, cough, congestion.        HISTORY:  Past Medical History:  Diagnosis Date  . 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 Kidney stones   . Pelvic pain   . Recurrent streptococcal tonsillitis    Past Surgical History:  Procedure Laterality Date  . TONSILLECTOMY AND ADENOIDECTOMY  2015  . WISDOM TOOTH EXTRACTION     Family History  Problem Relation Age of Onset  . Kidney Stones Father   . Kidney Stones Brother   . Cancer Maternal Grandmother        pancreatic  . Heart disease Maternal Grandfather 62  . Arthritis Paternal Grandmother   . Diabetes Paternal Grandmother     Allergies: Patient has no known allergies. Current Outpatient Medications on File Prior to Visit  Medication Sig Dispense Refill  . ALPRAZolam (XANAX) 0.25 MG tablet Take 0.25 mg by mouth at bedtime as needed for anxiety.    . ciprofloxacin-dexamethasone (CIPRODEX) OTIC suspension Place 4 drops into both ears 2 (two) times daily. X 5-7 days 7.5 mL 0   No current facility-administered medications on file prior to visit.     Social History   Tobacco Use  . Smoking status: Former Smoker    Last attempt to quit: 11/01/2009    Years since quitting: 8.1  . Smokeless tobacco: Never Used    Substance Use Topics  . Alcohol use: Yes    Alcohol/week: 1.0 oz    Types: 2 drink(s) per week  . Drug use: Yes    Review of Systems  Constitutional: Negative for chills and fever.  HENT: Negative for congestion, ear discharge, ear pain (resolved), sinus pressure and sinus pain.   Respiratory: Negative for cough.   Cardiovascular: Negative for chest pain and palpitations.  Gastrointestinal: Negative for nausea and vomiting.  Genitourinary: Negative for dysuria and pelvic pain.      Objective:    BP 110/82 (BP Location: Left Arm, Patient Position: Sitting, Cuff Size: Normal)   Pulse 99   Temp 98.4 F (36.9 C) (Oral)   Resp 16   Wt 153 lb 8 oz (69.6 kg)   LMP 12/07/2017   SpO2 99%   BMI 25.16 kg/m  BP Readings from Last 3 Encounters:  12/21/17 110/82  12/14/17 120/70  11/16/16 129/81   Wt Readings from Last 3 Encounters:  12/21/17 153 lb 8 oz (69.6 kg)  11/16/16 150 lb 9.6 oz (68.3 kg)  03/27/16 150 lb 6.4 oz (68.2 kg)    Physical Exam  Constitutional: She appears well-developed and well-nourished.  HENT:  Head: Normocephalic and atraumatic.  Right Ear: Hearing, tympanic membrane, external ear and ear canal normal.  No drainage, swelling or tenderness. No foreign bodies. Tympanic membrane is not erythematous and not bulging. No middle ear effusion. No decreased hearing is noted.  Left Ear: Hearing, tympanic membrane, external ear and ear canal normal. No drainage, swelling or tenderness. No foreign bodies. Tympanic membrane is not erythematous and not bulging.  No middle ear effusion. No decreased hearing is noted.  Nose: Nose normal. No rhinorrhea. Right sinus exhibits no maxillary sinus tenderness and no frontal sinus tenderness. Left sinus exhibits no maxillary sinus tenderness and no frontal sinus tenderness.  Mouth/Throat: Uvula is midline, oropharynx is clear and moist and mucous membranes are normal. No oropharyngeal exudate, posterior oropharyngeal edema,  posterior oropharyngeal erythema or tonsillar abscesses.  Eyes: Conjunctivae are normal.  Cardiovascular: Regular rhythm, normal heart sounds and normal pulses.  Pulmonary/Chest: Effort normal and breath sounds normal. She has no wheezes. She has no rhonchi. She has no rales.  Lymphadenopathy:       Head (right side): No submental, no submandibular, no tonsillar, no preauricular, no posterior auricular and no occipital adenopathy present.       Head (left side): No submental, no submandibular, no tonsillar, no preauricular, no posterior auricular and no occipital adenopathy present.    She has no cervical adenopathy.  Neurological: She is alert.  Skin: Skin is warm and dry.  Psychiatric: She has a normal mood and affect. Her speech is normal and behavior is normal. Thought content normal.  Vitals reviewed.      Assessment & Plan:   Problem List Items Addressed This Visit      Nervous and Auditory   Acute otitis media    Improving.  Advised Flonase for any residual inflammation.  Long discussion regarding suspected reactive lymphadenopathy, patient understands to stay vigilant and she will let me know if not completely return to normal size in the next month to 6 weeks.        Genitourinary   Dysmenorrhea    Ibuprofen PRN. Education on NOT to use NSAIDs during pregnancy.       Relevant Medications   ibuprofen (ADVIL,MOTRIN) 800 MG tablet     Other   Panic disorder - Primary    Doing very well. Not currently using. Advised not to use if became pregnant.       Relevant Orders   CBC with Differential/Platelet   Comprehensive metabolic panel   Hemoglobin A1c   Lipid panel   TSH   HIV antibody       I have discontinued Mackenzie Crawford's levofloxacin. I am also having her maintain her ciprofloxacin-dexamethasone, ALPRAZolam, and ibuprofen.   Meds ordered this encounter  Medications  . ibuprofen (ADVIL,MOTRIN) 800 MG tablet    Sig: Take 1 tablet (800 mg total) by mouth  every 8 (eight) hours as needed.    Dispense:  90 tablet    Refill:  3    Order Specific Question:   Supervising Provider    Answer:   Crecencio Mc [[id number]]    Return precautions given.   Risks, benefits, and alternatives of the medications and treatment plan prescribed today were discussed, and patient expressed understanding.   Education regarding symptom management and diagnosis given to patient on AVS.  Continue to follow with Burnard Hawthorne, FNP for routine health maintenance.   Tonye Pearson and I agreed with plan.   Mable Paris, FNP

## 2017-12-21 NOTE — Assessment & Plan Note (Addendum)
Ibuprofen PRN. Education on NOT to use NSAIDs during pregnancy.  Of note, to continue using prenatal vitamins.  Screening labs ordered today.  Patient will follow-up for Pap smear.

## 2017-12-22 LAB — HIV ANTIBODY (ROUTINE TESTING W REFLEX): HIV 1&2 Ab, 4th Generation: NONREACTIVE

## 2018-01-02 ENCOUNTER — Encounter: Payer: Self-pay | Admitting: Family

## 2018-02-19 ENCOUNTER — Encounter: Payer: Self-pay | Admitting: Family

## 2018-02-20 ENCOUNTER — Other Ambulatory Visit (HOSPITAL_COMMUNITY)
Admission: RE | Admit: 2018-02-20 | Discharge: 2018-02-20 | Disposition: A | Discharge status: Home / Self Care | Payer: Managed Care, Other (non HMO) | Source: Ambulatory Visit | Attending: Family | Admitting: Family

## 2018-02-20 ENCOUNTER — Ambulatory Visit: Payer: Managed Care, Other (non HMO) | Admitting: Family

## 2018-02-20 ENCOUNTER — Encounter: Payer: Self-pay | Admitting: Family

## 2018-02-20 VITALS — BP 128/84 | HR 72 | Temp 98.3°F | Wt 153.4 lb

## 2018-02-20 DIAGNOSIS — Z202 Contact with and (suspected) exposure to infections with a predominantly sexual mode of transmission: Secondary | ICD-10-CM

## 2018-02-20 DIAGNOSIS — J40 Bronchitis, not specified as acute or chronic: Secondary | ICD-10-CM

## 2018-02-20 DIAGNOSIS — Z23 Encounter for immunization: Secondary | ICD-10-CM | POA: Diagnosis not present

## 2018-02-20 LAB — POCT URINE PREGNANCY: PREG TEST UR: NEGATIVE

## 2018-02-20 MED ORDER — DOXYCYCLINE HYCLATE 100 MG PO TABS: 100.0000 mg | ORAL_TABLET | Freq: Two times a day (BID) | ORAL | 0 refills | Status: DC

## 2018-02-20 MED ORDER — HYDROCODONE-HOMATROPINE 5-1.5 MG/5ML PO SYRP: 5.0000 mL | ORAL_SOLUTION | Freq: Every evening | ORAL | 0 refills | Status: DC | PRN

## 2018-02-20 MED ORDER — AZITHROMYCIN 1 G PO PACK: 1.0000 g | PACK | Freq: Once | ORAL | 0 refills | Status: AC

## 2018-02-20 MED ORDER — CEFTRIAXONE SODIUM 500 MG IJ SOLR
500.0000 mg | Freq: Once | INTRAMUSCULAR | Status: AC
  Administered 2018-02-20: 500 mg via INTRAMUSCULAR

## 2018-02-20 MED ORDER — CEFTRIAXONE SODIUM 250 MG IJ SOLR: 250.0000 mg | Freq: Once | INTRAMUSCULAR | Status: DC

## 2018-02-20 NOTE — Assessment & Plan Note (Addendum)
Pending urine, serum studies.  Long discussion with patient about potential long-standing gonococcal infection, and risk of PID.  Patient has an appointment with her OB next month for her Pap , we agreed to defer pelvic exam; however we discussed the importance of having a pelvic exam possibly ultrasound.  Patient verbalized understanding of this.  Her preference today was to go ahead and be treated empirically for gonorrhea.  Based on guidelines will treat with ceftriaxone 250 IM, 1 g azithromycin.  To cover for PID (C. trachomatis ) , will also treat with doxycycline.  Long discussion regarding probiotics and risk of diarrheal infections with this regimen.  Patient verbalized understanding; she was agreement with all. will follow.

## 2018-02-20 NOTE — Assessment & Plan Note (Signed)
Well-appearing, no acute respiratory distress rapid strep and strep culture negative per patient.  Cough is primary symptom.  Suspect treatment with ceftriaxone, azithromycin, doxycycline today will be effective coverage.  Hycodan cough syrup at bedtime.  Patient let me know if not better.

## 2018-02-20 NOTE — Patient Instructions (Addendum)
Empirically treated you with 250mg  IM ceftriaxone and also 1g azthromycin once.   Treating you for pelvic inflammatory disease with doxycycline as discussed - PROBIOTICS are so important as all the antibiotics likely will offset your normal GI Flora  Important for you to keep your appointment with you OB in June.  Let me know if cough is not better  Please take cough medication at night only as needed. As we discussed, I do not recommend dosing throughout the day as coughing is a protective mechanism . It also helps to break up thick mucous.  Do not take cough suppressants with alcohol as can lead to trouble breathing. Advise caution if taking cough suppressant and operating machinery ( i.e driving a car) as you may feel very tired.

## 2018-02-20 NOTE — Progress Notes (Signed)
Subjective:    Patient ID: Mackenzie Crawford, female    DOB: 07/14/1986, 32 y.o.   MRN: [id number]  CC: Mackenzie Crawford is a 32 y.o. female who presents today for an acute visit.    HPI: Concerns for STD exposure  Husband was never tested for STDs. Tested positive for gonorrhea. Has noticed some yellow discharge. Normal menstrual cramping. Endorses pelvic pain with intercourse  Follows with OB, Dr Landry Mellow. Seeing them April 04 2018 for pap.   One week ago had sore throat. [facility] to [facility], Negative strep x 2 including rapid strep and strep culture. Has beenon allergies. Had been exposed to 62 yo nephew. Started on lidocaine, magic mouthwash, benadryl. Endorses cough, hoarseness, left side of throat is sore. Painful swallowing has improved. Mostly cough bothering her, worse at night. No fever, wheezing, sob.  Started on robitussin with some relief.   Non smoker.   LMP 02/09/18  Currently trying to conceive.    HISTORY:  Past Medical History:  Diagnosis Date  . 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 Kidney stones   . Pelvic pain   . Recurrent streptococcal tonsillitis    Past Surgical History:  Procedure Laterality Date  . TONSILLECTOMY AND ADENOIDECTOMY  2015  . WISDOM TOOTH EXTRACTION     Family History  Problem Relation Age of Onset  . Kidney Stones Father   . Kidney Stones Brother   . Cancer Maternal Grandmother        pancreatic  . Heart disease Maternal Grandfather 28  . Arthritis Paternal Grandmother   . Diabetes Paternal Grandmother     Allergies: Patient has no known allergies. Current Outpatient Medications on File Prior to Visit  Medication Sig Dispense Refill  . ALPRAZolam (XANAX) 0.25 MG tablet Take 0.25 mg by mouth at bedtime as needed for anxiety.    . ciprofloxacin-dexamethasone (CIPRODEX) OTIC suspension Place 4 drops into both ears 2 (two) times daily. X 5-7 days 7.5 mL 0  . ibuprofen (ADVIL,MOTRIN) 800 MG tablet Take 1 tablet (800 mg total) by  mouth every 8 (eight) hours as needed. 90 tablet 3   No current facility-administered medications on file prior to visit.     Social History   Tobacco Use  . Smoking status: Former Smoker    Last attempt to quit: 11/01/2009    Years since quitting: 8.3  . Smokeless tobacco: Never Used  Substance Use Topics  . Alcohol use: Yes    Alcohol/week: 1.0 oz    Types: 2 drink(s) per week  . Drug use: Yes    Review of Systems  Constitutional: Negative for chills and fever.  HENT: Positive for congestion, sore throat and trouble swallowing. Negative for ear pain and sinus pain.   Respiratory: Positive for cough. Negative for shortness of breath and wheezing.   Cardiovascular: Negative for chest pain and palpitations.  Gastrointestinal: Negative for nausea and vomiting.  Genitourinary: Positive for pelvic pain and vaginal discharge. Negative for dyspareunia, dysuria, vaginal bleeding and vaginal pain.      Objective:    BP 128/84 (BP Location: Left Arm, Patient Position: Sitting, Cuff Size: Normal)   Pulse 72   Temp 98.3 F (36.8 C) (Oral)   Wt 153 lb 6 oz (69.6 kg)   SpO2 98%   BMI 25.13 kg/m    Physical Exam  Constitutional: She appears well-developed and well-nourished.  HENT:  Head: Normocephalic and atraumatic.  Right Ear: Hearing, tympanic membrane, external ear  and ear canal normal. No drainage, swelling or tenderness. No foreign bodies. Tympanic membrane is not erythematous and not bulging. No middle ear effusion. No decreased hearing is noted.  Left Ear: Hearing, tympanic membrane, external ear and ear canal normal. No drainage, swelling or tenderness. No foreign bodies. Tympanic membrane is not erythematous and not bulging.  No middle ear effusion. No decreased hearing is noted.  Nose: Nose normal. No rhinorrhea. Right sinus exhibits no maxillary sinus tenderness and no frontal sinus tenderness. Left sinus exhibits no maxillary sinus tenderness and no frontal sinus  tenderness.  Mouth/Throat: Uvula is midline, oropharynx is clear and moist and mucous membranes are normal. No oropharyngeal exudate, posterior oropharyngeal edema, posterior oropharyngeal erythema or tonsillar abscesses.  Eyes: Conjunctivae are normal.  Cardiovascular: Regular rhythm, normal heart sounds and normal pulses.  Pulmonary/Chest: Effort normal and breath sounds normal. She has no wheezes. She has no rhonchi. She has no rales.  Lymphadenopathy:       Head (right side): No submental, no submandibular, no tonsillar, no preauricular, no posterior auricular and no occipital adenopathy present.       Head (left side): No submental, no submandibular, no tonsillar, no preauricular, no posterior auricular and no occipital adenopathy present.    She has no cervical adenopathy.  Neurological: She is alert.  Skin: Skin is warm and dry.  Psychiatric: She has a normal mood and affect. Her speech is normal and behavior is normal. Thought content normal.  Vitals reviewed.      Assessment & Plan:   Problem List Items Addressed This Visit      Respiratory   Bronchitis    Well-appearing, no acute respiratory distress rapid strep and strep culture negative per patient.  Cough is primary symptom.  Suspect treatment with ceftriaxone, azithromycin, doxycycline today will be effective coverage.  Hycodan cough syrup at bedtime.  Patient let me know if not better.      Relevant Medications   doxycycline (VIBRA-TABS) 100 MG tablet   HYDROcodone-homatropine (HYCODAN) 5-1.5 MG/5ML syrup     Other   STD exposure - Primary    Pending urine, serum studies.  Long discussion with patient about potential long-standing gonococcal infection, and risk of PID.  Patient has an appointment with her OB next month for her Pap , we agreed to defer pelvic exam; however we discussed the importance of having a pelvic exam possibly ultrasound.  Patient verbalized understanding of this.  Her preference today was to go  ahead and be treated empirically for gonorrhea.  Based on guidelines will treat with ceftriaxone 250 IM, 1 g azithromycin.  To cover for PID (C. trachomatis ) , will also treat with doxycycline.  Long discussion regarding probiotics and risk of diarrheal infections with this regimen.  Patient verbalized understanding; she was agreement with all. will follow.      Relevant Medications   azithromycin (ZITHROMAX) 1 g powder   doxycycline (VIBRA-TABS) 100 MG tablet   HYDROcodone-homatropine (HYCODAN) 5-1.5 MG/5ML syrup   cefTRIAXone (ROCEPHIN) injection 500 mg (Completed) (Start on 02/20/2018 12:30 PM)   Other Relevant Orders   Urine cytology ancillary only   RPR   Acute Hep Panel & Hep B Surface Ab   POCT urine pregnancy (Completed)        I am having Tonye Pearson start on azithromycin, doxycycline, and HYDROcodone-homatropine. I am also having her maintain her ciprofloxacin-dexamethasone, ALPRAZolam, and ibuprofen. We administered cefTRIAXone.   Meds ordered this encounter  Medications  . azithromycin (ZITHROMAX) 1  g powder    Sig: Take 1 packet (1 g total) by mouth once for 1 dose.    Dispense:  1 packet    Refill:  0    Order Specific Question:   Supervising Provider    Answer:   Derrel Nip, TERESA L [[id number]]  . doxycycline (VIBRA-TABS) 100 MG tablet    Sig: Take 1 tablet (100 mg total) by mouth 2 (two) times daily.    Dispense:  10 tablet    Refill:  0    Order Specific Question:   Supervising Provider    Answer:   Deborra Medina L [[id number]]  . HYDROcodone-homatropine (HYCODAN) 5-1.5 MG/5ML syrup    Sig: Take 5 mLs by mouth at bedtime as needed for cough.    Dispense:  30 mL    Refill:  0    Order Specific Question:   Supervising Provider    Answer:   Derrel Nip, TERESA L [[id number]]  . DISCONTD: cefTRIAXone (ROCEPHIN) injection 250 mg  . cefTRIAXone (ROCEPHIN) injection 500 mg    Return precautions given.   Risks, benefits, and alternatives of the medications and treatment plan prescribed  today were discussed, and patient expressed understanding.   Education regarding symptom management and diagnosis given to patient on AVS.  Continue to follow with Burnard Hawthorne, FNP for routine health maintenance.   Tonye Pearson and I agreed with plan.   Mable Paris, FNP

## 2018-02-21 LAB — ACUTE HEP PANEL AND HEP B SURFACE AB
HEP A IGM: NONREACTIVE
HEPATITIS C ANTIBODY REFILL: NONREACTIVE
Hep B C IgM: NONREACTIVE
Hepatitis B Surface Ag: NONREACTIVE
SIGNAL TO CUT-OFF: 0.01 (ref <1.00)

## 2018-02-21 LAB — URINE CYTOLOGY ANCILLARY ONLY
CHLAMYDIA, DNA PROBE: NEGATIVE
Neisseria Gonorrhea: NEGATIVE
Trichomonas: NEGATIVE

## 2018-02-21 LAB — RPR: RPR: NONREACTIVE

## 2018-02-22 ENCOUNTER — Encounter: Payer: Self-pay | Admitting: Family

## 2018-02-22 LAB — REFLEX TIQ

## 2018-02-23 LAB — URINE CYTOLOGY ANCILLARY ONLY
Bacterial vaginitis: NEGATIVE
CANDIDA VAGINITIS: NEGATIVE

## 2018-03-04 NOTE — Telephone Encounter (Signed)
Spoke with patient Mackenzie Crawford and scheduled

## 2018-04-04 ENCOUNTER — Other Ambulatory Visit: Payer: Self-pay | Admitting: Obstetrics and Gynecology

## 2018-04-04 ENCOUNTER — Other Ambulatory Visit (HOSPITAL_COMMUNITY)
Admission: RE | Admit: 2018-04-04 | Discharge: 2018-04-04 | Disposition: A | Discharge status: Home / Self Care | Payer: Managed Care, Other (non HMO) | Source: Ambulatory Visit | Attending: Obstetrics and Gynecology | Admitting: Obstetrics and Gynecology

## 2018-04-04 DIAGNOSIS — Z01419 Encounter for gynecological examination (general) (routine) without abnormal findings: Secondary | ICD-10-CM | POA: Insufficient documentation

## 2018-04-05 LAB — CYTOLOGY - PAP: Diagnosis: NEGATIVE

## 2018-09-10 LAB — OB RESULTS CONSOLE HEPATITIS B SURFACE ANTIGEN: Hepatitis B Surface Ag: NEGATIVE

## 2018-09-10 LAB — OB RESULTS CONSOLE ANTIBODY SCREEN: Antibody Screen: NEGATIVE

## 2018-09-10 LAB — OB RESULTS CONSOLE ABO/RH: RH Type: NEGATIVE

## 2018-09-10 LAB — OB RESULTS CONSOLE HGB/HCT, BLOOD
HCT: 37 (ref 29–41)
Hemoglobin: 12.8

## 2018-09-10 LAB — OB RESULTS CONSOLE RUBELLA ANTIBODY, IGM: RUBELLA: IMMUNE

## 2018-09-10 LAB — OB RESULTS CONSOLE RPR: RPR: NONREACTIVE

## 2018-09-10 LAB — HIV ANTIBODY (ROUTINE TESTING W REFLEX): HIV Screen 4th Generation wRfx: NONREACTIVE

## 2018-09-10 LAB — OB RESULTS CONSOLE PLATELET COUNT: Platelets: 285

## 2018-10-23 NOTE — L&D Delivery Note (Signed)
Delivery Note   Mackenzie Crawford is a 33 y.o. G1P1001 at [redacted]w[redacted]d Estimated Date of Delivery: 05/03/19  PRE-OPERATIVE DIAGNOSIS:  1) [redacted]w[redacted]d pregnancy.   POST-OPERATIVE DIAGNOSIS:  1) [redacted]w[redacted]d pregnancy s/p Vaginal, Spontaneous   Delivery Type: Vaginal, Spontaneous    Delivery Anesthesia: Epidural   Labor Complications:  Post dates    ESTIMATED BLOOD LOSS: 425 ml    FINDINGS:   1) female infant, Apgar scores of 9   at 1 minute and 9   at 5 minutes and a birthweight of   ounces.    2) Nuchal cord:No  SPECIMENS:   PLACENTA:   Appearance: Intact , 3 vessel, cord blood sample collected    Removal: Spontaneous      Disposition:  Held per protocol then discarded   DISPOSITION:  Infant to left in stable condition in the delivery room, with L&D personnel and mother,  NARRATIVE SUMMARY: Labor course:  Ms. [name] is a G1P1001 at [redacted]w[redacted]d who presented for induction of labor.  She progressed well in labor with pitocin.  She received the appropriate epidural anesthesia and proceeded to complete dilation. She evidenced good maternal expulsive effort during the second stage. Baby delivered LOT, shoulders delivered with ease. She went on to deliver a viable infant. The placenta delivered without problems and was noted to be complete. A perineal and vaginal examination was performed. Lacerations:  1st degree. Laceration was repaired with 3-0 Vicryl Rapide suture.  The patient tolerated this well. Vaginal vault count correct.   Philip Aspen, CNM  05/09/2019 12:07 PM

## 2018-10-28 ENCOUNTER — Encounter: Payer: Self-pay | Admitting: Certified Nurse Midwife

## 2018-10-29 ENCOUNTER — Encounter: Payer: Self-pay | Admitting: Certified Nurse Midwife

## 2018-10-29 ENCOUNTER — Ambulatory Visit (INDEPENDENT_AMBULATORY_CARE_PROVIDER_SITE_OTHER): Payer: Commercial Managed Care - PPO | Admitting: Certified Nurse Midwife

## 2018-10-29 VITALS — BP 119/76 | HR 69 | Wt 162.5 lb

## 2018-10-29 DIAGNOSIS — Z3401 Encounter for supervision of normal first pregnancy, first trimester: Secondary | ICD-10-CM | POA: Diagnosis not present

## 2018-10-29 DIAGNOSIS — Z3A13 13 weeks gestation of pregnancy: Secondary | ICD-10-CM

## 2018-10-29 DIAGNOSIS — Z3402 Encounter for supervision of normal first pregnancy, second trimester: Secondary | ICD-10-CM

## 2018-10-29 LAB — POCT URINALYSIS DIPSTICK OB
Bilirubin, UA: NEGATIVE
Blood, UA: NEGATIVE
GLUCOSE, UA: NEGATIVE
Ketones, UA: NEGATIVE
LEUKOCYTES UA: NEGATIVE
NITRITE UA: NEGATIVE
PROTEIN: NEGATIVE
Spec Grav, UA: 1.02 (ref 1.010–1.025)
Urobilinogen, UA: 0.2 E.U./dL
pH, UA: 7 (ref 5.0–8.0)

## 2018-10-29 NOTE — Patient Instructions (Addendum)
Commonly Asked Questions During Pregnancy  Cats: A parasite can be excreted in cat feces.  To avoid exposure you need to have another person empty the little box.  If you must empty the litter box you will need to wear gloves.  Wash your hands after handling your cat.  This parasite can also be found in raw or undercooked meat so this should also be avoided.  Colds, Sore Throats, Flu: Please check your medication sheet to see what you can take for symptoms.  If your symptoms are unrelieved by these medications please call the office.  Dental Work: Most any dental work Investment banker, corporate recommends is permitted.  X-rays should only be taken during the first trimester if absolutely necessary.  Your abdomen should be shielded with a lead apron during all x-rays.  Please notify your provider prior to receiving any x-rays.  Novocaine is fine; gas is not recommended.  If your dentist requires a note from [place] prior to dental work please call the office and we will provide one for you.  Exercise: Exercise is an important part of staying healthy during your pregnancy.  You may continue most exercises you were accustomed to prior to pregnancy.  Later in your pregnancy you will most likely notice you have difficulty with activities requiring balance like riding a bicycle.  It is important that you listen to your body and avoid activities that put you at a higher risk of falling.  Adequate rest and staying well hydrated are a must!  If you have questions about the safety of specific activities ask your provider.    Exposure to Children with illness: Try to avoid obvious exposure; report any symptoms to [place] when noted,  If you have chicken pos, red measles or mumps, you should be immune to these diseases.   Please do not take any vaccines while pregnant unless you have checked with your OB provider.  Fetal Movement: After 28 weeks we recommend you do "kick counts" twice daily.  Lie or sit down in a calm quiet environment  and count your baby movements "kicks".  You should feel your baby at least 10 times per hour.  If you have not felt 10 kicks within the first hour get up, walk around and have something sweet to eat or drink then repeat for an additional hour.  If count remains less than 10 per hour notify your provider.  Fumigating: Follow your pest control agent's advice as to how long to stay out of your home.  Ventilate the area well before re-entering.  Hemorrhoids:   Most over-the-counter preparations can be used during pregnancy.  Check your medication to see what is safe to use.  It is important to use a stool softener or fiber in your diet and to drink lots of liquids.  If hemorrhoids seem to be getting worse please call the office.   Hot Tubs:  Hot tubs Jacuzzis and saunas are not recommended while pregnant.  These increase your internal body temperature and should be avoided.  Intercourse:  Sexual intercourse is safe during pregnancy as long as you are comfortable, unless otherwise advised by your provider.  Spotting may occur after intercourse; report any bright red bleeding that is heavier than spotting.  Labor:  If you know that you are in labor, please go to the hospital.  If you are unsure, please call the office and let us help you decide what to do.  Lifting, straining, etc:  If your job requires  heavy lifting or straining please check with your provider for any limitations.  Generally, you should not lift items heavier than that you can lift simply with your hands and arms (no back muscles)  Painting:  Paint fumes do not harm your pregnancy, but may make you ill and should be avoided if possible.  Latex or water based paints have less odor than oils.  Use adequate ventilation while painting.  Permanents & Hair Color:  Chemicals in hair dyes are not recommended as they cause increase hair dryness which can increase hair loss during pregnancy.  " Highlighting" and permanents are allowed.  Dye may be  absorbed differently and permanents may not hold as well during pregnancy.  Sunbathing:  Use a sunscreen, as skin burns easily during pregnancy.  Drink plenty of fluids; avoid over heating.  Tanning Beds:  Because their possible side effects are still unknown, tanning beds are not recommended.  Ultrasound Scans:  Routine ultrasounds are performed at approximately 20 weeks.  You will be able to see your baby's general anatomy an if you would like to know the gender this can usually be determined as well.  If it is questionable when you conceived you may also receive an ultrasound early in your pregnancy for dating purposes.  Otherwise ultrasound exams are not routinely performed unless there is a medical necessity.  Although you can request a scan we ask that you pay for it when conducted because insurance does not cover " patient request" scans.  Work: If your pregnancy proceeds without complications you may work until your due date, unless your physician or employer advises otherwise.  Round Ligament Pain/Pelvic Discomfort:  Sharp, shooting pains not associated with bleeding are fairly common, usually occurring in the second trimester of pregnancy.  They tend to be worse when standing up or when you remain standing for long periods of time.  These are the result of pressure of certain pelvic ligaments called "round ligaments".  Rest, Tylenol and heat seem to be the most effective relief.  As the womb and fetus grow, they rise out of the pelvis and the discomfort improves.  Please notify the office if your pain seems different than that described.  It may represent a more serious condition.    How a Baby Grows During Pregnancy  Pregnancy begins when a female's sperm enters a female's egg (fertilization). Fertilization usually happens in one of the tubes (fallopian tubes) that connect the ovaries to the womb (uterus). The fertilized egg moves down the fallopian tube to the uterus. Once it reaches the  uterus, it implants into the lining of the uterus and begins to grow. For the first 10 weeks, the fertilized egg is called an embryo. After 10 weeks, it is called a fetus. As the fetus continues to grow, it receives oxygen and nutrients through tissue (placenta) that grows to support the developing baby. The placenta is the life support system for the baby. It provides oxygen and nutrition and removes waste. Learning as much as you can about your pregnancy and how your baby is developing can help you enjoy the experience. It can also make you aware of when there might be a problem and when to ask questions. How long does a typical pregnancy last? A pregnancy usually lasts 280 days, or about 40 weeks. Pregnancy is divided into three periods of growth, also called trimesters:  First trimester: 0-12 weeks.  Second trimester: 13-27 weeks.  Third trimester: 28-40 weeks. The day when your baby  is ready to be born (full term) is your estimated date of delivery. How does my baby develop month by month? First month  The fertilized egg attaches to the inside of the uterus.  Some cells will form the placenta. Others will form the fetus.  The arms, legs, brain, spinal cord, lungs, and heart begin to develop.  At the end of the first month, the heart begins to beat. Second month  The bones, inner ear, eyelids, hands, and feet form.  The genitals develop.  By the end of 8 weeks, all major organs are developing. Third month  All of the internal organs are forming.  Teeth develop below the gums.  Bones and muscles begin to grow. The spine can flex.  The skin is transparent.  Fingernails and toenails begin to form.  Arms and legs continue to grow longer, and hands and feet develop.  The fetus is about 3 inches (7.6 cm) long. Fourth month  The placenta is completely formed.  The external sex organs, neck, outer ear, eyebrows, eyelids, and fingernails are formed.  The fetus can hear,  swallow, and move its arms and legs.  The kidneys begin to produce urine.  The skin is covered with a white, waxy coating (vernix) and very fine hair (lanugo). Fifth month  The fetus moves around more and can be felt for the first time (quickening).  The fetus starts to sleep and wake up and may begin to suck its finger.  The nails grow to the end of the fingers.  The organ in the digestive system that makes bile (gallbladder) functions and helps to digest nutrients.  If your baby is a girl, eggs are present in her ovaries. If your baby is a boy, testicles start to move down into his scrotum. Sixth month  The lungs are formed.  The eyes open. The brain continues to develop.  Your baby has fingerprints and toe prints. Your baby's hair grows thicker.  At the end of the second trimester, the fetus is about 9 inches (22.9 cm) long. Seventh month  The fetus kicks and stretches.  The eyes are developed enough to sense changes in light.  The hands can make a grasping motion.  The fetus responds to sound. Eighth month  All organs and body systems are fully developed and functioning.  Bones harden, and taste buds develop. The fetus may hiccup.  Certain areas of the brain are still developing. The skull remains soft. Ninth month  The fetus gains about  lb (0.23 kg) each week.  The lungs are fully developed.  Patterns of sleep develop.  The fetus's head typically moves into a head-down position (vertex) in the uterus to prepare for birth.  The fetus weighs 6-9 lb (2.72-4.08 kg) and is 19-20 inches (48.26-50.8 cm) long. What can I do to have a healthy pregnancy and help my baby develop? General instructions  Take prenatal vitamins as directed by your health care provider. These include vitamins such as folic acid, iron, calcium, and vitamin D. They are important for healthy development.  Take medicines only as directed by your health care provider. Read labels and ask a  pharmacist or your health care provider whether over-the-counter medicines, supplements, and prescription drugs are safe to take during pregnancy.  Keep all follow-up visits as directed by your health care provider. This is important. Follow-up visits include prenatal care and screening tests. How do I know if my baby is developing well? At each prenatal visit, your health  care provider will do several different tests to check on your health and keep track of your baby's development. These include:  Fundal height and position. ? Your health care provider will measure your growing belly from your pubic bone to the top of the uterus using a tape measure. ? Your health care provider will also feel your belly to determine your baby's position.  Heartbeat. ? An ultrasound in the first trimester can confirm pregnancy and show a heartbeat, depending on how far along you are. ? Your health care provider will check your baby's heart rate at every prenatal visit.  Second trimester ultrasound. ? This ultrasound checks your baby's development. It also may show your baby's gender. What should I do if I have concerns about my baby's development? Always talk with your health care provider about any concerns that you may have about your pregnancy and your baby. Summary  A pregnancy usually lasts 280 days, or about 40 weeks. Pregnancy is divided into three periods of growth, also called trimesters.  Your health care provider will monitor your baby's growth and development throughout your pregnancy.  Follow your health care provider's recommendations about taking prenatal vitamins and medicines during your pregnancy.  Talk with your health care provider if you have any concerns about your pregnancy or your developing baby. This information is not intended to replace advice given to you by your health care provider. Make sure you discuss any questions you have with your health care provider. Document  Released: 03/27/2008 Document Revised: 08/22/2017 Document Reviewed: 08/22/2017 Elsevier Interactive Patient Education  2019 [place] of Pregnancy  The second trimester is from week 14 through week 27 (month 4 through 6). This is often the time in pregnancy that you feel your best. Often times, morning sickness has lessened or quit. You may have more energy, and you may get hungry more often. Your unborn baby is growing rapidly. At the end of the sixth month, he or she is about 9 inches long and weighs about 1 pounds. You will likely feel the baby move between 18 and 20 weeks of pregnancy. Follow these instructions at home: Medicines  Take over-the-counter and prescription medicines only as told by your doctor. Some medicines are safe and some medicines are not safe during pregnancy.  Take a prenatal vitamin that contains at least 600 micrograms (mcg) of folic acid.  If you have trouble pooping (constipation), take medicine that will make your stool soft (stool softener) if your doctor approves. Eating and drinking   Eat regular, healthy meals.  Avoid raw meat and uncooked cheese.  If you get low calcium from the food you eat, talk to your doctor about taking a daily calcium supplement.  Avoid foods that are high in fat and sugars, such as fried and sweet foods.  If you feel sick to your stomach (nauseous) or throw up (vomit): ? Eat 4 or 5 small meals a day instead of 3 large meals. ? Try eating a few soda crackers. ? Drink liquids between meals instead of during meals.  To prevent constipation: ? Eat foods that are high in fiber, like fresh fruits and vegetables, whole grains, and beans. ? Drink enough fluids to keep your pee (urine) clear or pale yellow. Activity  Exercise only as told by your doctor. Stop exercising if you start to have cramps.  Do not exercise if it is too hot, too humid, or if you are in a place of great height (high  altitude).  Avoid heavy lifting.  Wear low-heeled shoes. Sit and stand up straight.  You can continue to have sex unless your doctor tells you not to. Relieving pain and discomfort  Wear a good support bra if your breasts are tender.  Take warm water baths (sitz baths) to soothe pain or discomfort caused by hemorrhoids. Use hemorrhoid cream if your doctor approves.  Rest with your legs raised if you have leg cramps or low back pain.  If you develop puffy, bulging veins (varicose veins) in your legs: ? Wear support hose or compression stockings as told by your doctor. ? Raise (elevate) your feet for 15 minutes, 3-4 times a day. ? Limit salt in your food. Prenatal care  Write down your questions. Take them to your prenatal visits.  Keep all your prenatal visits as told by your doctor. This is important. Safety  Wear your seat belt when driving.  Make a list of emergency phone numbers, including numbers for family, friends, the hospital, and police and fire departments. General instructions  Ask your doctor about the right foods to eat or for help finding a counselor, if you need these services.  Ask your doctor about local prenatal classes. Begin classes before month 6 of your pregnancy.  Do not use hot tubs, steam rooms, or saunas.  Do not douche or use tampons or scented sanitary pads.  Do not cross your legs for long periods of time.  Visit your dentist if you have not done so. Use a soft toothbrush to brush your teeth. Floss gently.  Avoid all smoking, herbs, and alcohol. Avoid drugs that are not approved by your doctor.  Do not use any products that contain nicotine or tobacco, such as cigarettes and e-cigarettes. If you need help quitting, ask your doctor.  Avoid cat litter boxes and soil used by cats. These carry germs that can cause birth defects in the baby and can cause a loss of your baby (miscarriage) or stillbirth. Contact a doctor if:  You have mild  cramps or pressure in your lower belly.  You have pain when you pee (urinate).  You have bad smelling fluid coming from your vagina.  You continue to feel sick to your stomach (nauseous), throw up (vomit), or have watery poop (diarrhea).  You have a nagging pain in your belly area.  You feel dizzy. Get help right away if:  You have a fever.  You are leaking fluid from your vagina.  You have spotting or bleeding from your vagina.  You have severe belly cramping or pain.  You lose or gain weight rapidly.  You have trouble catching your breath and have chest pain.  You notice sudden or extreme puffiness (swelling) of your face, hands, ankles, feet, or legs.  You have not felt the baby move in over an hour.  You have severe headaches that do not go away when you take medicine.  You have trouble seeing. Summary  The second trimester is from week 14 through week 27 (months 4 through 6). This is often the time in pregnancy that you feel your best.  To take care of yourself and your unborn baby, you will need to eat healthy meals, take medicines only if your doctor tells you to do so, and do activities that are safe for you and your baby.  Call your doctor if you get sick or if you notice anything unusual about your pregnancy. Also, call your doctor if you need help with the  right food to eat, or if you want to know what activities are safe for you. This information is not intended to replace advice given to you by your health care provider. Make sure you discuss any questions you have with your health care provider. Document Released: 01/03/2010 Document Revised: 11/14/2016 Document Reviewed: 11/14/2016 Elsevier Interactive Patient Education  2019 [employer].  Common Medications Safe in Pregnancy  Acne:      Constipation:  Benzoyl Peroxide     Colace  Clindamycin      Dulcolax Suppository  Topica Erythromycin     Fibercon  Salicylic  Acid      Metamucil         Miralax AVOID:        Senakot   Accutane    Cough:  Retin-A       Cough Drops  Tetracycline      Phenergan w/ Codeine if Rx  Minocycline      Robitussin (Plain & DM)  Antibiotics:     Crabs/Lice:  Ceclor       RID  Cephalosporins    AVOID:  E-Mycins      Kwell  Keflex  Macrobid/Macrodantin   Diarrhea:  Penicillin      Kao-Pectate  Zithromax      Imodium AD         PUSH FLUIDS AVOID:       Cipro     Fever:  Tetracycline      Tylenol (Regular or Extra  Minocycline       Strength)  Levaquin      Extra Strength-Do not          Exceed 8 tabs/24 hrs Caffeine:        <268m/day (equiv. To 1 cup of coffee or  approx. 3 12 oz sodas)         Gas: Cold/Hayfever:       Gas-X  Benadryl      Mylicon  Claritin       Phazyme  **Claritin-D        Chlor-Trimeton    Headaches:  Dimetapp      ASA-Free Excedrin  Drixoral-Non-Drowsy     Cold Compress  Mucinex (Guaifenasin)     Tylenol (Regular or Extra  Sudafed/Sudafed-12 Hour     Strength)  **Sudafed PE Pseudoephedrine   Tylenol Cold & Sinus     Vicks Vapor Rub  Zyrtec  **AVOID if Problems With Blood Pressure         Heartburn: Avoid lying down for at least 1 hour after meals  Aciphex      Maalox     Rash:  Milk of Magnesia     Benadryl    Mylanta       1% Hydrocortisone Cream  Pepcid  Pepcid Complete   Sleep Aids:  Prevacid      Ambien   Prilosec       Benadryl  Rolaids       Chamomile Tea  Tums (Limit 4/day)     Unisom  Zantac       Tylenol PM         Warm milk-add vanilla or  Hemorrhoids:       Sugar for taste  Anusol/Anusol H.C.  (RX: Analapram 2.5%)  Sugar Substitutes:  Hydrocortisone OTC     Ok in moderation  Preparation H      Tucks        Vaseline lotion applied to tissue with wiping  Herpes:     Throat:  Acyclovir      Oragel  Famvir  Valtrex     Vaccines:         Flu Shot Leg Cramps:       *Gardasil  Benadryl      Hepatitis A         Hepatitis B Nasal  Spray:       Pneumovax  Saline Nasal Spray     Polio Booster         Tetanus Nausea:       Tuberculosis test or PPD  Vitamin B6 25 mg TID   AVOID:    Dramamine      *Gardasil  Emetrol       Live Poliovirus  Ginger Root 250 mg QID    MMR (measles, mumps &  High Complex Carbs @ Bedtime    rebella)  Sea Bands-Accupressure    Varicella (Chickenpox)  Unisom 1/2 tab TID     *No known complications           If received before Pain:         Known pregnancy;   Darvocet       Resume series after  Lortab        Delivery  Percocet    Yeast:   Tramadol      Femstat  Tylenol 3      Gyne-lotrimin  Ultram       Monistat  Vicodin           MISC:         All Sunscreens           Hair Coloring/highlights          Insect Repellant's          (Including DEET)         Mystic Tans WHAT OB PATIENTS CAN EXPECT   Confirmation of pregnancy and ultrasound ordered if medically indicated-[redacted] weeks gestation  New OB (NOB) intake with nurse and New OB (NOB) labs- [redacted] weeks gestation  New OB (NOB) physical examination with provider- 11/[redacted] weeks gestation  Flu vaccine-[redacted] weeks gestation  Anatomy scan-[redacted] weeks gestation  Glucose tolerance test, blood work to test for anemia, T-dap vaccine-[redacted] weeks gestation  Vaginal swabs/cultures-STD/Group B strep-[redacted] weeks gestation  Appointments every 4 weeks until 28 weeks  Every 2 weeks from 28 weeks until 36 weeks  Weekly visits from 36 weeks until delivery   Eating Plan for Pregnant Women While you are pregnant, your body requires additional nutrition to help support your growing baby. You also have a higher need for some vitamins and minerals, such as folic acid, calcium, iron, and vitamin D. Eating a healthy, well-balanced diet is very important for your health and your baby's health. Your need for extra calories varies for the three 61-monthsegments of your pregnancy (trimesters). For most women, it is recommended to consume:  150 extra calories a day during  the first trimester.  300 extra calories a day during the second trimester.  300 extra calories a day during the third trimester. What are tips for following this plan?   Do not try to lose weight or go on a diet during pregnancy.  Limit your overall intake of foods that have "empty calories." These are foods that have little nutritional value, such as sweets, desserts, candies, and sugar-sweetened beverages.  Eat a variety of foods (especially fruits and vegetables) to get a full range of vitamins and minerals.  Take a prenatal vitamin to help meet your additional vitamin and mineral needs during pregnancy, specifically for folic acid, iron, calcium, and vitamin D.  Remember to stay active. Ask your health care provider what types of exercise and activities are safe for you.  Practice good food safety and cleanliness. Wash your hands before you eat and after you prepare raw meat. Wash all fruits and vegetables well before peeling or eating. Taking these actions can help to prevent food-borne illnesses that can be very dangerous to your baby, such as listeriosis. Ask your health care provider for more information about listeriosis. What does 150 extra calories look like? Healthy options that provide 150 extra calories each day could be any of the following:  6-8 oz (170-230 g) of plain low-fat yogurt with  cup of berries.  1 apple with 2 teaspoons (11 g) of peanut butter.  Cut-up vegetables with  cup (60 g) of hummus.  8 oz (230 mL) or 1 cup of low-fat chocolate milk.  1 stick of string cheese with 1 medium orange.  1 peanut butter and jelly sandwich that is made with one slice of whole-wheat bread and 1 tsp (5 g) of peanut butter. For 300 extra calories, you could eat two of those healthy options each day. What is a healthy amount of weight to gain? The right amount of weight gain for you is based on your BMI before you became pregnant. If your BMI:  Was less than 18  (underweight), you should gain 28-40 lb (13-18 kg).  Was 18-24.9 (normal), you should gain 25-35 lb (11-16 kg).  Was 25-29.9 (overweight), you should gain 15-25 lb (7-11 kg).  Was 30 or greater (obese), you should gain 11-20 lb (5-9 kg). What if I am having twins or multiples? Generally, if you are carrying twins or multiples:  You may need to eat 300-600 extra calories a day.  The recommended range for total weight gain is 25-54 lb (11-25 kg), depending on your BMI before pregnancy.  Talk with your health care provider to find out about nutritional needs, weight gain, and exercise that is right for you. What foods can I eat?  Grains All grains. Choose whole grains, such as whole-wheat bread, oatmeal, or brown rice. Vegetables All vegetables. Eat a variety of colors and types of vegetables. Remember to wash your vegetables well before peeling or eating. Fruits All fruits. Eat a variety of colors and types of fruit. Remember to wash your fruits well before peeling or eating. Meats and other protein foods Lean meats, including chicken, [place], fish, and lean cuts of beef, veal, or pork. If you eat fish or seafood, choose options that are higher in omega-3 fatty acids and lower in mercury, such as salmon, herring, mussels, trout, sardines, pollock, shrimp, crab, and lobster. Tofu. Tempeh. Beans. Eggs. Peanut butter and other nut butters. Make sure that all meats, poultry, and eggs are cooked to food-safe temperatures or "well-done." Two or more servings of fish are recommended each week in order to get the most benefits from omega-3 fatty acids that are found in seafood. Choose fish that are lower in mercury. You can find more information online:  GuamGaming.ch Dairy Pasteurized milk and milk alternatives (such as almond milk). Pasteurized yogurt and pasteurized cheese. Cottage cheese. Sour cream. Beverages Water. Juices that contain 100% fruit juice or vegetable juice. Caffeine-free teas  and decaffeinated coffee. Drinks that contain caffeine are okay to drink, but it is better to avoid caffeine. Keep your total  caffeine intake to less than 200 mg each day (which is 12 oz or 355 mL of coffee, tea, or soda) or the limit as told by your health care provider. Fats and oils Fats and oils are okay to include in moderation. Sweets and desserts Sweets and desserts are okay to include in moderation. Seasoning and other foods All pasteurized condiments. The items listed above may not be a complete list of recommended foods and beverages. Contact your dietitian for more options. What foods are not recommended? Vegetables Raw (unpasteurized) vegetable juices. Fruits Unpasteurized fruit juices. Meats and other protein foods Lunch meats, bologna, hot dogs, or other deli meats. (If you must eat those meats, reheat them until they are steaming hot.) Refrigerated pat, meat spreads from a meat counter, smoked seafood that is found in the refrigerated section of a store. Raw or undercooked meats, poultry, and eggs. Raw fish, such as sushi or sashimi. Fish that have high mercury content, such as tilefish, shark, swordfish, and king mackerel. To learn more about mercury in fish, talk with your health care provider or look for online resources, such as:  GuamGaming.ch Dairy Raw (unpasteurized) milk and any foods that have raw milk in them. Soft cheeses, such as feta, queso blanco, queso fresco, Brie, Camembert cheeses, blue-veined cheeses, and Panela cheese (unless it is made with pasteurized milk, which must be stated on the label). Beverages Alcohol. Sugar-sweetened beverages, such as sodas, teas, or energy drinks. Seasoning and other foods Homemade fermented foods and drinks, such as pickles, sauerkraut, or kombucha drinks. (Store-bought pasteurized versions of these are okay.) Salads that are made in a store or deli, such as ham salad, chicken salad, egg salad, tuna salad, and seafood  salad. The items listed above may not be a complete list of foods and beverages to avoid. Contact your dietitian for more information. Where to find more information To calculate the number of calories you need based on your height, weight, and activity level, you can use an online calculator such as:  MobileTransition.ch To calculate how much weight you should gain during pregnancy, you can use an online pregnancy weight gain calculator such as:  StreamingFood.com.cy Summary  While you are pregnant, your body requires additional nutrition to help support your growing baby.  Eat a variety of foods, especially fruits and vegetables to get a full range of vitamins and minerals.  Practice good food safety and cleanliness. Wash your hands before you eat and after you prepare raw meat. Wash all fruits and vegetables well before peeling or eating. Taking these actions can help to prevent food-borne illnesses, such as listeriosis, that can be very dangerous to your baby.  Do not eat raw meat or fish. Do not eat fish that have high mercury content, such as tilefish, shark, swordfish, and king mackerel. Do not eat unpasteurized (raw) dairy.  Take a prenatal vitamin to help meet your additional vitamin and mineral needs during pregnancy, specifically for folic acid, iron, calcium, and vitamin D. This information is not intended to replace advice given to you by your health care provider. Make sure you discuss any questions you have with your health care provider. Document Released: 07/24/2014 Document Revised: 07/06/2017 Document Reviewed: 07/06/2017 Elsevier Interactive Patient Education  2019 [employer].  Back Pain in Pregnancy Back pain during pregnancy is common. Back pain may be caused by several factors that are related to changes during your pregnancy. Follow these instructions at home: Managing pain, stiffness, and swelling  If  directed, for sudden (acute) back pain, put ice on the painful area. ? Put ice in a plastic bag. ? Place a towel between your skin and the bag. ? Leave the ice on for 20 minutes, 2-3 times per day.  If directed, apply heat to the affected area before you exercise. Use the heat source that your health care provider recommends, such as a moist heat pack or a heating pad. ? Place a towel between your skin and the heat source. ? Leave the heat on for 20-30 minutes. ? Remove the heat if your skin turns bright red. This is especially important if you are unable to feel pain, heat, or cold. You may have a greater risk of getting burned.  If directed, massage the affected area. Activity  Exercise as told by your health care provider. Gentle exercise is the best way to prevent or manage back pain.  Listen to your body when lifting. If lifting hurts, ask for help or bend your knees. This uses your leg muscles instead of your back muscles.  Squat down when picking up something from the floor. Do not bend over.  Only use bed rest for short periods as told by your health care provider. Bed rest should only be used for the most severe episodes of back pain. Standing, sitting, and lying down  Do not stand in one place for long periods of time.  Use good posture when sitting. Make sure your head rests over your shoulders and is not hanging forward. Use a pillow on your lower back if necessary.  Try sleeping on your side, preferably the left side, with a pregnancy support pillow or 1-2 regular pillows between your legs. ? If you have back pain after a night's rest, your bed may be too soft. ? A firm mattress may provide more support for your back during pregnancy. General instructions  Do not wear high heels.  Eat a healthy diet. Try to gain weight within your health care provider's recommendations.  Use a maternity girdle, elastic sling, or back brace as told by your health care provider.  Take  over-the-counter and prescription medicines only as told by your health care provider.  Work with a physical therapist or massage therapist to find ways to manage back pain. Acupuncture or massage therapy may be helpful.  Keep all follow-up visits as told by your health care provider. This is important. Contact a health care provider if:  Your back pain interferes with your daily activities.  You have increasing pain in other parts of your body. Get help right away if:  You develop numbness, tingling, weakness, or problems with the use of your arms or legs.  You develop severe back pain that is not controlled with medicine.  You have a change in bowel or bladder control.  You develop shortness of breath, dizziness, or you faint.  You develop nausea, vomiting, or sweating.  You have back pain that is a rhythmic, cramping pain similar to labor pains. Labor pain is usually 1-2 minutes apart, lasts for about 1 minute, and involves a bearing down feeling or pressure in your pelvis.  You have back pain and your water breaks or you have vaginal bleeding.  You have back pain or numbness that travels down your leg.  Your back pain developed after you fell.  You develop pain on one side of your back.  You see blood in your urine.  You develop skin blisters in the area of your  back pain. Summary  Back pain may be caused by several factors that are related to changes during your pregnancy.  Follow instructions as told by your health care provider for managing pain, stiffness, and swelling.  Exercise as told by your health care provider. Gentle exercise is the best way to prevent or manage back pain.  Take over-the-counter and prescription medicines only as told by your health care provider.  Keep all follow-up visits as told by your health care provider. This is important. This information is not intended to replace advice given to you by your health care provider. Make sure you  discuss any questions you have with your health care provider. Document Released: 01/17/2006 Document Revised: 03/27/2018 Document Reviewed: 03/27/2018 Elsevier Interactive Patient Education  2019 [employer].  Round Ligament Pain  The round ligament is a cord of muscle and tissue that helps support the uterus. It can become a source of pain during pregnancy if it becomes stretched or twisted as the baby grows. The pain usually begins in the second trimester (13-28 weeks) of pregnancy, and it can come and go until the baby is delivered. It is not a serious problem, and it does not cause harm to the baby. Round ligament pain is usually a short, sharp, and pinching pain, but it can also be a dull, lingering, and aching pain. The pain is felt in the lower side of the abdomen or in the groin. It usually starts deep in the groin and moves up to the outside of the hip area. The pain may occur when you:  Suddenly change position, such as quickly going from a sitting to standing position.  Roll over in bed.  Cough or sneeze.  Do physical activity. Follow these instructions at home:   Watch your condition for any changes.  When the pain starts, relax. Then try any of these methods to help with the pain: ? Sitting down. ? Flexing your knees up to your abdomen. ? Lying on your side with one pillow under your abdomen and another pillow between your legs. ? Sitting in a warm bath for 15-20 minutes or until the pain goes away.  Take over-the-counter and prescription medicines only as told by your health care provider.  Move slowly when you sit down or stand up.  Avoid long walks if they cause pain.  Stop or reduce your physical activities if they cause pain.  Keep all follow-up visits as told by your health care provider. This is important. Contact a health care provider if:  Your pain does not go away with treatment.  You feel pain in your back that you did not have before.  Your  medicine is not helping. Get help right away if:  You have a fever or chills.  You develop uterine contractions.  You have vaginal bleeding.  You have nausea or vomiting.  You have diarrhea.  You have pain when you urinate. Summary  Round ligament pain is felt in the lower abdomen or groin. It is usually a short, sharp, and pinching pain. It can also be a dull, lingering, and aching pain.  This pain usually begins in the second trimester (13-28 weeks). It occurs because the uterus is stretching with the growing baby, and it is not harmful to the baby.  You may notice the pain when you suddenly change position, when you cough or sneeze, or during physical activity.  Relaxing, flexing your knees to your abdomen, lying on one side, or taking a warm  bath may help to get rid of the pain.  Get help from your health care provider if the pain does not go away or if you have vaginal bleeding, nausea, vomiting, diarrhea, or painful urination. This information is not intended to replace advice given to you by your health care provider. Make sure you discuss any questions you have with your health care provider. Document Released: 07/18/2008 Document Revised: 03/27/2018 Document Reviewed: 03/27/2018 Elsevier Interactive Patient Education  2019 [employer].

## 2018-10-29 NOTE — Progress Notes (Signed)
Mackenzie Crawford presents for [place] nurse interview visit.  Pregnancy confirmation done at [facility].  G-1.  P-0.  LMP 07/27/2018.  EDD 05/03/2019. Dating scan not done.  Pregnancy education material explained and given. 1 cat in the home, husband changes litter box.  NOB labs not ordered.  TSH/HgA1C not ordered.  Body mass index is 26.63 kg/m.  Sickle cell lab not ordered. HIV Lab drawn at previous OBGYN.  PNV encouraged.  Genetic screening options discussed.  Genetic testing: Unsure.  FMLA consent signed.  Financial policy reviewed.

## 2018-10-30 ENCOUNTER — Other Ambulatory Visit: Payer: Self-pay | Admitting: Obstetrics and Gynecology

## 2018-10-30 DIAGNOSIS — Z3201 Encounter for pregnancy test, result positive: Secondary | ICD-10-CM

## 2018-10-30 LAB — VARICELLA ZOSTER ANTIBODY, IGG: Varicella zoster IgG: <135 index — ABNORMAL LOW (ref >165)

## 2018-10-31 ENCOUNTER — Ambulatory Visit (INDEPENDENT_AMBULATORY_CARE_PROVIDER_SITE_OTHER): Payer: Managed Care, Other (non HMO)

## 2018-10-31 DIAGNOSIS — Z3687 Encounter for antenatal screening for uncertain dates: Secondary | ICD-10-CM | POA: Diagnosis not present

## 2018-10-31 DIAGNOSIS — Z3201 Encounter for pregnancy test, result positive: Secondary | ICD-10-CM

## 2018-11-01 DIAGNOSIS — Z3402 Encounter for supervision of normal first pregnancy, second trimester: Secondary | ICD-10-CM | POA: Insufficient documentation

## 2018-11-01 NOTE — Progress Notes (Signed)
NEW OB HISTORY AND PHYSICAL  SUBJECTIVE:       Mackenzie Crawford is a 33 y.o. G1P0 female, Patient's last menstrual period was 07/27/2018., Estimated Date of Delivery: 05/03/19, [redacted]w[redacted]d, presents today for establishment of Prenatal Care.  Pregnancy was confirmed at [facility] in [place]. Endorses breast tenderness, constipation and insomnia.   Denies difficulty breathing or respiratory distress, chest pain, abdominal pain, vaginal bleeding, dysuria, and leg pain or swelling.   Desires dating/viability ultrasound and genetic screening.    Gynecologic History  Patient's last menstrual period was 07/27/2018.   Contraception: none   Last Pap: 2019. Results were: Negative  Obstetric History  OB History  Gravida Para Term Preterm AB Living  1            SAB TAB Ectopic Multiple Live Births               # Outcome Date GA Lbr Len/2nd Weight Sex Delivery Anes PTL Lv  1 Current             Past Medical History:  Diagnosis Date  . 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 Kidney stones   . Pelvic pain   . Recurrent streptococcal tonsillitis     Past Surgical History:  Procedure Laterality Date  . TONSILLECTOMY AND ADENOIDECTOMY  2015  . WISDOM TOOTH EXTRACTION      Current Outpatient Medications on File Prior to Visit  Medication Sig Dispense Refill  . Prenat-FeFmCb-DSS-FA-DHA w/o A (CITRANATAL HARMONY) 27-1-260 MG CAPS Take by mouth daily.     No current facility-administered medications on file prior to visit.     No Known Allergies  Social History   Socioeconomic History  . Marital status: Single    Spouse name: Not on file  . Number of children: Not on file  . Years of education: Not on file  . Highest education level: Not on file  Occupational History  . Not on file  Social Needs  . Financial resource strain: Not on file  . Food insecurity:    Worry: Not on file    Inability: Not on file  . Transportation needs:    Medical: Not on file     Non-medical: Not on file  Tobacco Use  . Smoking status: Former Smoker    Last attempt to quit: 11/01/2009    Years since quitting: 9.0  . Smokeless tobacco: Never Used  Substance and Sexual Activity  . Alcohol use: Yes    Alcohol/week: 2.0 standard drinks    Types: 2 drink(s) per week  . Drug use: Yes  . Sexual activity: Not Currently  Lifestyle  . Physical activity:    Days per week: Not on file    Minutes per session: Not on file  . Stress: Not on file  Relationships  . Social connections:    Talks on phone: Not on file    Gets together: Not on file    Attends religious service: Not on file    Active member of club or organization: Not on file    Attends meetings of clubs or organizations: Not on file    Relationship status: Not on file  . Intimate partner violence:    Fear of current or ex partner: Not on file    Emotionally abused: Not on file    Physically abused: Not on file    Forced sexual activity: Not on file  Other Topics Concern  . Not on file  Social  History Narrative   Lives in [place].   From [place].      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 Kidney Stones Father   . Kidney Stones Brother   . Cancer Maternal Grandmother        pancreatic  . Heart disease Maternal Grandfather 41  . Arthritis Paternal Grandmother   . Diabetes Paternal Grandmother     The following portions of the patient's history were reviewed and updated as appropriate: allergies, current medications, past OB history, past medical history, past surgical history, past family history, past social history, and problem list.  Review of Systems  ROS negative except as noted above. Information obtained from patient and spouse.   OBJECTIVE:  BP 119/76   Pulse 69   Wt 162 lb 8 oz (73.7 kg)   LMP 07/27/2018   BMI 26.63 kg/m    Initial Physical Exam (New OB)  GENERAL APPEARANCE: alert, well appearing, in no apparent distress  HEAD: normocephalic, atraumatic  MOUTH: mucous membranes moist, pharynx normal without lesions  THYROID: no thyromegaly or masses present  BREASTS: no masses noted, no significant tenderness, no palpable axillary nodes, no skin changes  LUNGS: clear to auscultation, no wheezes, rales or rhonchi, symmetric air entry  HEART: regular rate and rhythm, no murmurs  ABDOMEN: soft, nontender, nondistended, no abnormal masses, no epigastric pain and FHT present  EXTREMITIES: no redness or tenderness in the calves or thighs, no edema  SKIN: normal coloration and turgor, no rashes  LYMPH NODES: no adenopathy palpable  NEUROLOGIC: alert, oriented, normal speech, no focal findings or movement disorder noted  PELVIC EXAM EXTERNAL GENITALIA: normal appearing vulva with no masses, tenderness or lesions VAGINA: no abnormal discharge or lesions CERVIX: no lesions or cervical motion tenderness UTERUS: gravid ADNEXA: no masses palpable and nontender OB EXAM PELVIMETRY: appears adequate  ASSESSMENT: Normal pregnancy Rh negative-will need Rhogam at 28 wks Desires genetic screening-Panorama ordered today Desires dating/viability ultrasound-will add on  PLAN: Prenatal care New OB counseling: The patient has been given an overview regarding routine prenatal care. Recommendations regarding diet, weight gain, and exercise in pregnancy were given. Prenatal testing, optional genetic testing, and ultrasound use in pregnancy were reviewed.  Benefits of Breast Feeding were discussed. The patient is encouraged to consider nursing her baby post partum. Reviewed red flag symptoms and when to call.  RTC x 4 weeks for ROB or sooner if needed.  See orders.    Diona Fanti, CNM [facility], [facility]

## 2018-11-26 ENCOUNTER — Ambulatory Visit (INDEPENDENT_AMBULATORY_CARE_PROVIDER_SITE_OTHER): Payer: Commercial Managed Care - PPO | Admitting: Certified Nurse Midwife

## 2018-11-26 VITALS — BP 128/69 | HR 79 | Wt 166.1 lb

## 2018-11-26 DIAGNOSIS — Z3401 Encounter for supervision of normal first pregnancy, first trimester: Secondary | ICD-10-CM

## 2018-11-26 LAB — POCT URINALYSIS DIPSTICK OB
Bilirubin, UA: NEGATIVE
Blood, UA: NEGATIVE
Glucose, UA: NEGATIVE
KETONES UA: NEGATIVE
Leukocytes, UA: NEGATIVE
NITRITE UA: NEGATIVE
PH UA: 6.5 (ref 5.0–8.0)
POC,PROTEIN,UA: NEGATIVE
Spec Grav, UA: 1.015 (ref 1.010–1.025)
UROBILINOGEN UA: 0.2 U/dL

## 2018-11-26 NOTE — Patient Instructions (Signed)

## 2018-11-26 NOTE — Addendum Note (Signed)
Addended by: Raliegh Ip on: 11/26/2018 10:11 AM   Modules accepted: Orders

## 2018-11-26 NOTE — Progress Notes (Signed)
ROB doing well. Pt complains of back and sciatic pain . She has been using heat, tylenol , and doing stretches. Recommend use of ice. She request referral to chiropractor. Order placed. Discussed use of Belly band, pt states she just bought one. Reviewed round ligament pain. She is feeling some fluttering. Anatomy u/s at next appointment in 3 wks.   Philip Aspen, CNM

## 2018-11-29 LAB — GC/CHLAMYDIA PROBE AMP
Chlamydia trachomatis, NAA: NEGATIVE
Neisseria gonorrhoeae by PCR: NEGATIVE

## 2018-12-18 ENCOUNTER — Encounter: Payer: Self-pay | Admitting: Obstetrics and Gynecology

## 2018-12-18 ENCOUNTER — Ambulatory Visit (INDEPENDENT_AMBULATORY_CARE_PROVIDER_SITE_OTHER): Payer: Commercial Managed Care - PPO | Admitting: Obstetrics and Gynecology

## 2018-12-18 ENCOUNTER — Ambulatory Visit (INDEPENDENT_AMBULATORY_CARE_PROVIDER_SITE_OTHER): Payer: Commercial Managed Care - PPO

## 2018-12-18 VITALS — BP 122/74 | HR 88 | Wt 173.0 lb

## 2018-12-18 DIAGNOSIS — Z3401 Encounter for supervision of normal first pregnancy, first trimester: Secondary | ICD-10-CM

## 2018-12-18 DIAGNOSIS — Z3A20 20 weeks gestation of pregnancy: Secondary | ICD-10-CM

## 2018-12-18 DIAGNOSIS — Z3492 Encounter for supervision of normal pregnancy, unspecified, second trimester: Secondary | ICD-10-CM

## 2018-12-18 DIAGNOSIS — Z363 Encounter for antenatal screening for malformations: Secondary | ICD-10-CM

## 2018-12-18 LAB — POCT URINALYSIS DIPSTICK OB
Bilirubin, UA: NEGATIVE
Blood, UA: NEGATIVE
Glucose, UA: NEGATIVE
Ketones, UA: NEGATIVE
Leukocytes, UA: NEGATIVE
NITRITE UA: NEGATIVE
PROTEIN: NEGATIVE
Spec Grav, UA: 1.01 (ref 1.010–1.025)
Urobilinogen, UA: 0.2 E.U./dL
pH, UA: 6 (ref 5.0–8.0)

## 2018-12-18 NOTE — Progress Notes (Signed)
ROB and anatomy scan- Reviewed u/s findings below:  Indications:Anatomy Ultrasound Findings:  Singleton intrauterine pregnancy is visualized with FHR at 143 BPM. Biometrics give an (U/S) Gestational age of [redacted]w[redacted]d and an (U/S) EDD of 04/29/19; this correlates with the clinically established Estimated Date of Delivery: 05/03/19  Fetal presentation is Cephalic.  EFW: wnl. Placenta: is Rt lateral , the posterior extension is longer than the anterior one. . Grade: 0 AFI: subjectively normal.  Anatomic survey is incomplete for 4CH,RVOT, N/Lips d/t fetal position  and normal; Gender - female.    Right Ovary is normal in appearance. Left Ovary is normal appearance. Survey of the adnexa demonstrates no adnexal masses. There is no free peritoneal fluid in the cul de sac.  Impression: 1. [redacted]w[redacted]d Viable Singleton Intrauterine pregnancy by U/S. 2. (U/S) EDD is consistent with Clinically established Estimated Date of Delivery: 05/03/19 . 3. Normal Anatomy Scan, incomplete for 4CH,RVOT, N/Lips d/t fetal position                         Has enrolled in CBC and ICC, will look into breast feeding class.

## 2018-12-18 NOTE — Progress Notes (Signed)
ROB- anatomy scan done today, having some headaches taking tylenol doesn't help

## 2018-12-19 ENCOUNTER — Encounter: Payer: Commercial Managed Care - PPO | Admitting: Obstetrics and Gynecology

## 2018-12-19 ENCOUNTER — Other Ambulatory Visit: Payer: Commercial Managed Care - PPO

## 2018-12-24 ENCOUNTER — Ambulatory Visit (INDEPENDENT_AMBULATORY_CARE_PROVIDER_SITE_OTHER): Payer: Commercial Managed Care - PPO | Admitting: Certified Nurse Midwife

## 2018-12-24 VITALS — BP 124/74 | HR 72 | Wt 174.2 lb

## 2018-12-24 DIAGNOSIS — Z3492 Encounter for supervision of normal pregnancy, unspecified, second trimester: Secondary | ICD-10-CM

## 2018-12-24 LAB — POCT URINALYSIS DIPSTICK OB
BILIRUBIN UA: NEGATIVE
Blood, UA: NEGATIVE
Glucose, UA: NEGATIVE
Ketones, UA: NEGATIVE
Leukocytes, UA: NEGATIVE
Nitrite, UA: NEGATIVE
POC,PROTEIN,UA: NEGATIVE
Spec Grav, UA: <=1.005 — AB (ref 1.010–1.025)
Urobilinogen, UA: 0.2 E.U./dL
pH, UA: 5 (ref 5.0–8.0)

## 2018-12-24 NOTE — Progress Notes (Signed)
Pt presents today for problem visit. States she had some bleeding this morning. Small amount of brownish bleeding. Pt admits to recent intercourse. Reassurance given. Speculum exam shows closed cervix , no polyps present, no active bleeding. Pt feels good movement. Denies contractions. U/s ( from yesterday) results reviewed with pt. She is to follow up for completion.   Philip Aspen, CNM

## 2018-12-24 NOTE — Patient Instructions (Signed)
Vaginal Bleeding During Pregnancy, Second Trimester ° °A small amount of bleeding (spotting) from the vagina is relatively common during pregnancy. It usually stops on its own. Various things can cause spotting during pregnancy. Sometimes the bleeding is normal and is not a sign of a problem in the pregnancy. However, bleeding can also be a sign of something serious. Be sure to tell your health care provider about any vaginal bleeding right away. °Some possible causes of vaginal bleeding during the second trimester include: °· Infection, inflammation, or growths (polyps) on the cervix. °· A condition in which the placenta partially or completely covers the opening of the cervix inside the uterus (placenta previa). °· The placenta separating from the uterus (placenta abruption). °· Early (preterm) labor. °· The cervix opening and thinning before pregnancy is at term and before labor starts (cervical insufficiency). °· A mass of tissue developing in the uterus due to an egg being fertilized incorrectly (molar pregnancy). °Follow these instructions at home: °Activity °· Follow instructions from your health care provider about limiting your activity. Ask what activities are safe for you. °· If needed, make plans for someone to help with your regular activities. °· Do not exercise or do activities that take a lot of effort unless your health care provider approves. °· Do not lift anything that is heavier than 10 lb (4.5 kg), or the limit that your health care provider tells you, until he or she says that it is safe. °· Do not have sex or orgasms until your health care provider says that this is safe. °Medicines °· Take over-the-counter and prescription medicines only as told by your health care provider. °· Do not take aspirin because it can cause bleeding. °General instructions °· Pay attention to any changes in your symptoms. °· Write down how many pads you use each day, how often you change pads, and how soaked  (saturated) they are. °· Do not use tampons or douche. °· If you pass any tissue from your vagina, save the tissue so you can show it to your health care provider. °· Keep all follow-up visits as told by your health care provider. This is important. °Contact a health care provider if: °· You have vaginal bleeding during any time of your pregnancy. °· You have cramps or labor pains. °· You have a fever that does not get better when you take medicines. °Get help right away if: °· You have severe cramps in your back or abdomen. °· You have contractions. °· You have chills. °· You pass large clots or a large amount of tissue from your vagina. °· Your bleeding increases. °· You feel light-headed or weak, or you faint. °· You are leaking fluid or have a gush of fluid from your vagina. °Summary °· Various things can cause bleeding or spotting in pregnancy. °· Be sure to tell your health care provider about any vaginal bleeding right away. °· Follow instructions from your health care provider about limiting your activity. Ask what activities are safe for you. °This information is not intended to replace advice given to you by your health care provider. Make sure you discuss any questions you have with your health care provider. °Document Released: 07/19/2005 Document Revised: 01/11/2017 Document Reviewed: 01/11/2017 °Elsevier Interactive Patient Education © 2019 Elsevier Inc. ° °

## 2019-01-15 ENCOUNTER — Other Ambulatory Visit: Payer: Commercial Managed Care - PPO

## 2019-01-15 ENCOUNTER — Telehealth: Payer: Self-pay

## 2019-01-15 NOTE — Telephone Encounter (Signed)
Coronavirus (COVID-19) Are you at risk?  Are you at risk for the Coronavirus (COVID-19)?  To be considered HIGH RISK for Coronavirus (COVID-19), you have to meet the following criteria:  . Traveled to [place], [place], [place], [place] or [place]; or in the [place] to [place], [place], [place], or [place]; and have fever, cough, and shortness of breath within the last 2 weeks of travel OR . Been in close contact with a person diagnosed with COVID-19 within the last 2 weeks and have fever, cough, and shortness of breath . IF YOU DO NOT MEET THESE CRITERIA, YOU ARE CONSIDERED LOW RISK FOR COVID-19.  What to do if you are HIGH RISK for COVID-19?  . If you are having a medical emergency, call 911. . Seek medical care right away. Before you go to a doctor's office, urgent care or emergency department, call ahead and tell them about your recent travel, contact with someone diagnosed with COVID-19, and your symptoms. You should receive instructions from your physician's office regarding next steps of care.  . When you arrive at healthcare provider, tell the healthcare staff immediately you have returned from visiting [place], [place], [place], [place] or [place]; or traveled in the [place] to [place], [place], [place], or [place]; in the last two weeks or you have been in close contact with a person diagnosed with COVID-19 in the last 2 weeks.   . Tell the health care staff about your symptoms: fever, cough and shortness of breath. . After you have been seen by a medical provider, you will be either: o Tested for (COVID-19) and discharged home on quarantine except to seek medical care if symptoms worsen, and asked to  - Stay home and avoid contact with others until you get your results (4-5 days)  - Avoid travel on public transportation if possible (such as bus, train, or airplane) or o Sent to the Emergency Department by EMS for evaluation, COVID-19 testing, and possible  admission depending on your condition and test results.  What to do if you are LOW RISK for COVID-19?  Reduce your risk of any infection by using the same precautions used for avoiding the common cold or flu:  . Wash your hands often with soap and warm water for at least 20 seconds.  If soap and water are not readily available, use an alcohol-based hand sanitizer with at least 60% alcohol.  . If coughing or sneezing, cover your mouth and nose by coughing or sneezing into the elbow areas of your shirt or coat, into a tissue or into your sleeve (not your hands). . Avoid shaking hands with others and consider head nods or verbal greetings only. . Avoid touching your eyes, nose, or mouth with unwashed hands.  . Avoid close contact with people who are sick. . Avoid places or events with large numbers of people in one location, like concerts or sporting events. . Carefully consider travel plans you have or are making. . If you are planning any travel outside or inside the [place], visit the CDC's Travelers' Health webpage for the latest health notices. . If you have some symptoms but not all symptoms, continue to monitor at home and seek medical attention if your symptoms worsen. . If you are having a medical emergency, call 911.   ADDITIONAL HEALTHCARE OPTIONS FOR PATIENTS  [facility] / e-Visit: [email or web address]         [facility]: [phone]  [place]   [facility]: [phone]                   [facility]: [phone]   Prescreened for covid/flu- neg. cm 

## 2019-01-16 ENCOUNTER — Ambulatory Visit (INDEPENDENT_AMBULATORY_CARE_PROVIDER_SITE_OTHER): Payer: Commercial Managed Care - PPO | Admitting: Obstetrics and Gynecology

## 2019-01-16 ENCOUNTER — Other Ambulatory Visit: Payer: Self-pay

## 2019-01-16 ENCOUNTER — Ambulatory Visit (INDEPENDENT_AMBULATORY_CARE_PROVIDER_SITE_OTHER): Payer: Commercial Managed Care - PPO

## 2019-01-16 VITALS — BP 128/73 | HR 78 | Wt 178.1 lb

## 2019-01-16 DIAGNOSIS — Z3492 Encounter for supervision of normal pregnancy, unspecified, second trimester: Secondary | ICD-10-CM

## 2019-01-16 DIAGNOSIS — Z3402 Encounter for supervision of normal first pregnancy, second trimester: Secondary | ICD-10-CM

## 2019-01-16 DIAGNOSIS — Z362 Encounter for other antenatal screening follow-up: Secondary | ICD-10-CM | POA: Diagnosis not present

## 2019-01-16 NOTE — Patient Instructions (Signed)
FREQUENTLY ASKED QUESTIONS FOR OBSTETRICS/PEDIATRICS    Q: Why are visitor restrictions different for maternity care areas?  [place] is restricting visitors for the duration of the patient's hospitalization. The birth of a child involves the mother, considered the patient, and a birthing partner. These are unprecedented times and we are making the exception to allow a birthing partner to be a part of the patient unit. No other guests will be allowed in our [place] at [facility] and at [facility].   Q: Are credentialed doulas allowed to support their existing patients?  We acknowledge the value these doula partnerships offer our care teams and many birthing families in our communities. Each laboring mother is allowed one birthing partner of the patient's choosing for her entire hospitalization.   Q: Are visitor restrictions different for hospitalized children?  Pediatric patients (infants and children under 1 years of age), such as those in the Children's Unit, Pediatric ICU and NICU, will be allowed two visitors (parents or legal guardians)   Q: Are pregnant women at an increased risk for COVID-19?  The [employer] of Obstetricians and Gynecologists (ACOG) is monitoring closely the coronavirus pandemic. With the limited information available, data does not indicate pregnant women are at an increased risk. However, pregnant women are known to be at greater risk for respiratory infections like flu. With that in mind, expectant mothers are considered an at-risk population for COVID-19, according to ACOG.   Q: Are newborns at an increased risk for COVID-19?  A limited sample of COVID-19 data with newborns indicates the virus is not transferred to the infant during pregnancy. However, postpartum separation is recommended by the Centers for  Disease Control (CDC). As a result [facility] recommends and strongly encourages temporary separation of moms and babies who test positive for COVID-19 or are awaiting results to rule out COVID-19 based on CDC guidelines.   Q: If you have a suspected case of COVID-19, is the NICU couplet care room an option?  No. If either patient is considered at-risk for having COVID-19, the [place] [facility] will not use the NICU couplet care rooms for that family.   Q: [place] is urging that elective procedures be postponed. What is considered elective for women's and children's service line?  NOT ELECTIVE: Obstetric procedures, even those with an element of choice on timing, are not considered elective. Circumcisions are considered elective procedures, however, these do not deplete blood products and other resources, which is the spirit in which the COVID-19 postponement of elective procedures was intended. Therefore, circumcisions will be allowed.   ELECTIVE: Postpartum tubal ligations are considered elective and should be postponed. Q&A for Obstetricians, Gynecologists and Pediatricians  Published January 10, 2019   [facility] supports as much as possible the medical care  team working with the patient's individual needs to address timing during these unprecedented times. We seek the support of our medical care team in preserving needed resources throughout our crisis response to COVID-19.   Q: How does COVID-19 impact breastfeeding?  Breastmilk is safe for your baby - even if the mother has tested positive for COVID-19. If a COVID-19+ mother decides to breastfeed while inpatient and after discharge, we suggest proper protective equipment be worn and hand hygiene be performed before and after feeding the infant. The new mother also has the option to pump her milk and have a healthy family member feed the baby to protect the baby from getting the virus.   Q: Should we urge  patients to avoid baby showers and large gatherings?  Yes. As has been recommended for all citizens in our communities, gatherings of 10 or more should be avoided - pregnant or not. Seek creative options for "hosting" baby showers through electronic means that honor the request for social distancing during this time of heightened awareness.   Q: Should patients miss their prenatal appointments?  No. Prenatal visits are NOT elective. While we want to limit contact and exposure, prenatal care is vital right now. Contact your physician's office if you have concerns about your visits. We are limiting outpatient office visits to the patient and one guest in order to reduce the potential for exposure.   Q: What if a pregnant woman feels sick? Should she miss her prenatal visit then?  A pregnant woman experiencing coronavirus-like symptoms (i.e., cough, fever, difficulty breathing, shortness of breath, gastrointestinal issues) should contact her pregnancy care provider by phone. Her medical professional can best determine whether she should use a video visit or possibly go to a collection site to be tested for COVID-19. Contacting her primary care provider or her pregnancy care provider is her first step.   Q: What can I do about childbirth education? All the classes are cancelled.  The [facility] [place] will offer online learning to support mothers on their journey. We currently offer Understanding Childbirth, Understanding Breastfeeding and Understanding Newborn Care as an online class. Please visit our website, [email or web address], to register for an online class.   Q: How can I keep from getting COVID-19? Q&A for Obstetricians, Gynecologists and Pediatricians  Published January 10, 2019   Together, we can reduce the risk of exposure to the virus and help you and your family remain healthy and safe. One of the best ways to protect yourself is to wash your hands frequently using soap and  water. Also, you should avoid touching your eyes, nose and mouth with unwashed hands, avoid physical contact with others and practice social distancing.   Q: How are employees being informed about what to do?  [place] [facility] leaders receive a daily COVID-19 update and share relevant information with their teams. This is a time when health care professionals are called on to lead within our community. We appreciate our staff's engagement with our COVID-19 updates and encourage them to share best practices on reducing the spread of the virus with our patients and community. We are prepared to provide the exceptional COVID-19 care and coordination our community needs, expects and deserves.   Q: Who's in charge of this issue at [facility]?  The leadership structure and process established to address COVID-19 includes Chief Physician Executive Phoebe Sharps, MD; Infection Prevention Medical Director Carlyle Basques, MD; and Infection Prevention Interim Director Hubert Azure, MSN, RN, CIC, CSPDT. A team  of [place] experts reflecting a broad spectrum of our workforce is meeting daily to evaluate new information we receive about COVID-19 and to adapt policies and practices accordingly.                         Published January 10, 2019    

## 2019-01-16 NOTE — Progress Notes (Signed)
ROB and follow up u/s: Reviewed u/s below and discussed covid-19 recommendations and restrictions.   Date of Service: 01/16/2019   Indications: Anatomy follow up ultrasound Findings:  Singleton intrauterine pregnancy is visualized with FHR at 146 BPM.  Fetal presentation is Breech.  Placenta: posterior. Grade: 0 AFI: subjectively normal.  Anatomic survey is complete.   There is no free peritoneal fluid in the cul de sac.  Impression: 1. [redacted]w[redacted]d Viable Singleton Intrauterine pregnancy previously established criteria. 2. Normal Anatomy Scan is now complete

## 2019-01-28 ENCOUNTER — Encounter: Payer: Self-pay | Admitting: Certified Nurse Midwife

## 2019-02-13 ENCOUNTER — Telehealth: Payer: Self-pay | Admitting: *Deleted

## 2019-02-13 NOTE — Telephone Encounter (Signed)
Coronavirus (COVID-19) Are you at risk?  Are you at risk for the Coronavirus (COVID-19)?  To be considered HIGH RISK for Coronavirus (COVID-19), you have to meet the following criteria:  . Traveled to [place], [place], [place], [place] or [place]; or in the [place] to [place], [place], [place], or [place]; and have fever, cough, and shortness of breath within the last 2 weeks of travel OR . Been in close contact with a person diagnosed with COVID-19 within the last 2 weeks and have fever, cough, and shortness of breath . IF YOU DO NOT MEET THESE CRITERIA, YOU ARE CONSIDERED LOW RISK FOR COVID-19.  What to do if you are HIGH RISK for COVID-19?  . If you are having a medical emergency, call 911. . Seek medical care right away. Before you go to a doctor's office, urgent care or emergency department, call ahead and tell them about your recent travel, contact with someone diagnosed with COVID-19, and your symptoms. You should receive instructions from your physician's office regarding next steps of care.  . When you arrive at healthcare provider, tell the healthcare staff immediately you have returned from visiting [place], [place], [place], [place] or [place]; or traveled in the [place] to [place], [place], [place], or [place]; in the last two weeks or you have been in close contact with a person diagnosed with COVID-19 in the last 2 weeks.   . Tell the health care staff about your symptoms: fever, cough and shortness of breath. . After you have been seen by a medical provider, you will be either: o Tested for (COVID-19) and discharged home on quarantine except to seek medical care if symptoms worsen, and asked to  - Stay home and avoid contact with others until you get your results (4-5 days)  - Avoid travel on public transportation if possible (such as bus, train, or airplane) or o Sent to the Emergency Department by EMS for evaluation, COVID-19 testing, and possible  admission depending on your condition and test results.  What to do if you are LOW RISK for COVID-19?  Reduce your risk of any infection by using the same precautions used for avoiding the common cold or flu:  . Wash your hands often with soap and warm water for at least 20 seconds.  If soap and water are not readily available, use an alcohol-based hand sanitizer with at least 60% alcohol.  . If coughing or sneezing, cover your mouth and nose by coughing or sneezing into the elbow areas of your shirt or coat, into a tissue or into your sleeve (not your hands). . Avoid shaking hands with others and consider head nods or verbal greetings only. . Avoid touching your eyes, nose, or mouth with unwashed hands.  . Avoid close contact with people who are sick. . Avoid places or events with large numbers of people in one location, like concerts or sporting events. . Carefully consider travel plans you have or are making. . If you are planning any travel outside or inside the [place], visit the CDC's Travelers' Health webpage for the latest health notices. . If you have some symptoms but not all symptoms, continue to monitor at home and seek medical attention if your symptoms worsen. . If you are having a medical emergency, call 911.   ADDITIONAL HEALTHCARE OPTIONS FOR PATIENTS  [facility] / e-Visit: [email or web address]         [facility]: [phone]  [place]   [facility]: [phone]                   [facility]: [phone]   Spoke with pt denies any sx.  Jamill Wetmore, CMA 

## 2019-02-14 ENCOUNTER — Ambulatory Visit (INDEPENDENT_AMBULATORY_CARE_PROVIDER_SITE_OTHER): Payer: Commercial Managed Care - PPO | Admitting: Certified Nurse Midwife

## 2019-02-14 ENCOUNTER — Other Ambulatory Visit (INDEPENDENT_AMBULATORY_CARE_PROVIDER_SITE_OTHER): Payer: Commercial Managed Care - PPO

## 2019-02-14 ENCOUNTER — Other Ambulatory Visit: Payer: Self-pay

## 2019-02-14 ENCOUNTER — Encounter: Payer: Commercial Managed Care - PPO | Admitting: Certified Nurse Midwife

## 2019-02-14 VITALS — BP 139/55 | HR 82 | Wt 179.4 lb

## 2019-02-14 DIAGNOSIS — Z6791 Unspecified blood type, Rh negative: Secondary | ICD-10-CM

## 2019-02-14 DIAGNOSIS — Z131 Encounter for screening for diabetes mellitus: Secondary | ICD-10-CM

## 2019-02-14 DIAGNOSIS — O26893 Other specified pregnancy related conditions, third trimester: Secondary | ICD-10-CM

## 2019-02-14 DIAGNOSIS — Z113 Encounter for screening for infections with a predominantly sexual mode of transmission: Secondary | ICD-10-CM

## 2019-02-14 DIAGNOSIS — Z130 Encounter for screening for diseases of the blood and blood-forming organs and certain disorders involving the immune mechanism: Secondary | ICD-10-CM

## 2019-02-14 DIAGNOSIS — Z23 Encounter for immunization: Secondary | ICD-10-CM | POA: Diagnosis not present

## 2019-02-14 DIAGNOSIS — Z3493 Encounter for supervision of normal pregnancy, unspecified, third trimester: Secondary | ICD-10-CM

## 2019-02-14 LAB — POCT URINALYSIS DIPSTICK OB
Bilirubin, UA: NEGATIVE
Blood, UA: NEGATIVE
Glucose, UA: NEGATIVE
Ketones, UA: NEGATIVE
Leukocytes, UA: NEGATIVE
Nitrite, UA: NEGATIVE
POC,PROTEIN,UA: NEGATIVE
Spec Grav, UA: 1.02 (ref 1.010–1.025)
Urobilinogen, UA: 0.2 E.U./dL
pH, UA: 6 (ref 5.0–8.0)

## 2019-02-14 MED ORDER — RHO D IMMUNE GLOBULIN 1500 UNITS IM SOSY
1500.0000 [IU] | PREFILLED_SYRINGE | Freq: Once | INTRAMUSCULAR | Status: AC
  Administered 2019-02-14: 1500 [IU] via INTRAMUSCULAR

## 2019-02-14 MED ORDER — TETANUS-DIPHTH-ACELL PERTUSSIS 5-2.5-18.5 LF-MCG/0.5 IM SUSP
0.5000 mL | Freq: Once | INTRAMUSCULAR | Status: AC
  Administered 2019-02-14: 0.5 mL via INTRAMUSCULAR

## 2019-02-14 NOTE — Progress Notes (Signed)
ROB-Doing well overall. Disappointed unable to have baby shower and feels like she is missing out on some of the fun parts of first pregnancy. Emotional support provided. 28 week labs today. TDaP and Rhogam administered. Blood transfusion consent reviewed and signed. Third trimester handouts given. Completed breastfeeding and newborn essential courses. Desires epidural and WOULD LIKE TO MEET MD ON CALL WHEN ADMITTED FOR LABOR. Discussed COVID restrictions and precautions. Encouraged twice daily kick counts. Anticipatory guidance regarding course of prenatal care. Reviewed red flag symptoms and when to call. RTC x 4 weeks for ROB or sooner if needed.

## 2019-02-14 NOTE — Patient Instructions (Addendum)
Vaginal Delivery  Vaginal delivery means that you give birth by pushing your baby out of your birth canal (vagina). A team of health care providers will help you before, during, and after vaginal delivery. Birth experiences are unique for every woman and every pregnancy, and birth experiences vary depending on where you choose to give birth. What happens when I arrive at the birth center or hospital? Once you are in labor and have been admitted into the hospital or birth center, your health care provider may:  Review your pregnancy history and any concerns that you have.  Insert an IV into one of your veins. This may be used to give you fluids and medicines.  Check your blood pressure, pulse, temperature, and heart rate (vital signs).  Check whether your bag of water (amniotic sac) has broken (ruptured).  Talk with you about your birth plan and discuss pain control options. Monitoring Your health care provider may monitor your contractions (uterine monitoring) and your baby's heart rate (fetal monitoring). You may need to be monitored:  Often, but not continuously (intermittently).  All the time or for long periods at a time (continuously). Continuous monitoring may be needed if: ? You are taking certain medicines, such as medicine to relieve pain or make your contractions stronger. ? You have pregnancy or labor complications. Monitoring may be done by:  Placing a special stethoscope or a handheld monitoring device on your abdomen to check your baby's heartbeat and to check for contractions.  Placing monitors on your abdomen (external monitors) to record your baby's heartbeat and the frequency and length of contractions.  Placing monitors inside your uterus through your vagina (internal monitors) to record your baby's heartbeat and the frequency, length, and strength of your contractions. Depending on the type of monitor, it may remain in your uterus or on your baby's head until birth.   Telemetry. This is a type of continuous monitoring that can be done with external or internal monitors. Instead of having to stay in bed, you are able to move around during telemetry. Physical exam Your health care provider may perform frequent physical exams. This may include:  Checking how and where your baby is positioned in your uterus.  Checking your cervix to determine: ? Whether it is thinning out (effacing). ? Whether it is opening up (dilating). What happens during labor and delivery?  Normal labor and delivery is divided into the following three stages: Stage 1  This is the longest stage of labor.  This stage can last for hours or days.  Throughout this stage, you will feel contractions. Contractions generally feel mild, infrequent, and irregular at first. They get stronger, more frequent (about every 2-3 minutes), and more regular as you move through this stage.  This stage ends when your cervix is completely dilated to 4 inches (10 cm) and completely effaced. Stage 2  This stage starts once your cervix is completely effaced and dilated and lasts until the delivery of your baby.  This stage may last from 20 minutes to 2 hours.  This is the stage where you will feel an urge to push your baby out of your vagina.  You may feel stretching and burning pain, especially when the widest part of your baby's head passes through the vaginal opening (crowning).  Once your baby is delivered, the umbilical cord will be clamped and cut. This usually occurs after waiting a period of 1-2 minutes after delivery.  Your baby will be placed on your bare chest (  skin-to-skin contact) in an upright position and covered with a warm blanket. Watch your baby for feeding cues, like rooting or sucking, and help the baby to your breast for his or her first feeding. Stage 3  This stage starts immediately after the birth of your baby and ends after you deliver the placenta.  This stage may take  anywhere from 5 to 30 minutes.  After your baby has been delivered, you will feel contractions as your body expels the placenta and your uterus contracts to control bleeding. What can I expect after labor and delivery?  After labor is over, you and your baby will be monitored closely until you are ready to go home to ensure that you are both healthy. Your health care team will teach you how to care for yourself and your baby.  You and your baby will stay in the same room (rooming in) during your hospital stay. This will encourage early bonding and successful breastfeeding.  You may continue to receive fluids and medicines through an IV.  Your uterus will be checked and massaged regularly (fundal massage).  You will have some soreness and pain in your abdomen, vagina, and the area of skin between your vaginal opening and your anus (perineum).  If an incision was made near your vagina (episiotomy) or if you had some vaginal tearing during delivery, cold compresses may be placed on your episiotomy or your tear. This helps to reduce pain and swelling.  You may be given a squirt bottle to use instead of wiping when you go to the bathroom. To use the squirt bottle, follow these steps: ? Before you urinate, fill the squirt bottle with warm water. Do not use hot water. ? After you urinate, while you are sitting on the toilet, use the squirt bottle to rinse the area around your urethra and vaginal opening. This rinses away any urine and blood. ? Fill the squirt bottle with clean water every time you use the bathroom.  It is normal to have vaginal bleeding after delivery. Wear a sanitary pad for vaginal bleeding and discharge. Summary  Vaginal delivery means that you will give birth by pushing your baby out of your birth canal (vagina).  Your health care provider may monitor your contractions (uterine monitoring) and your baby's heart rate (fetal monitoring).  Your health care provider may perform  a physical exam.  Normal labor and delivery is divided into three stages.  After labor is over, you and your baby will be monitored closely until you are ready to go home. This information is not intended to replace advice given to you by your health care provider. Make sure you discuss any questions you have with your health care provider. Document Released: 07/18/2008 Document Revised: 11/13/2017 Document Reviewed: 11/13/2017 Elsevier Interactive Patient Education  2019 [employer]. Breastfeeding  Choosing to breastfeed is one of the best decisions you can make for yourself and your baby. A change in hormones during pregnancy causes your breasts to make breast milk in your milk-producing glands. Hormones prevent breast milk from being released before your baby is born. They also prompt milk flow after birth. Once breastfeeding has begun, thoughts of your baby, as well as his or her sucking or crying, can stimulate the release of milk from your milk-producing glands. Benefits of breastfeeding Research shows that breastfeeding offers many health benefits for infants and mothers. It also offers a cost-free and convenient way to feed your baby. For your baby  Your first milk (  colostrum) helps your baby's digestive system to function better.  Special cells in your milk (antibodies) help your baby to fight off infections.  Breastfed babies are less likely to develop asthma, allergies, obesity, or type 2 diabetes. They are also at lower risk for sudden infant death syndrome (SIDS).  Nutrients in breast milk are better able to meet your baby's needs compared to infant formula.  Breast milk improves your baby's brain development. For you  Breastfeeding helps to create a very special bond between you and your baby.  Breastfeeding is convenient. Breast milk costs nothing and is always available at the correct temperature.  Breastfeeding helps to burn calories. It helps you to lose the weight  that you gained during pregnancy.  Breastfeeding makes your uterus return faster to its size before pregnancy. It also slows bleeding (lochia) after you give birth.  Breastfeeding helps to lower your risk of developing type 2 diabetes, osteoporosis, rheumatoid arthritis, cardiovascular disease, and breast, ovarian, uterine, and endometrial cancer later in life. Breastfeeding basics Starting breastfeeding  Find a comfortable place to sit or lie down, with your neck and back well-supported.  Place a pillow or a rolled-up blanket under your baby to bring him or her to the level of your breast (if you are seated). Nursing pillows are specially designed to help support your arms and your baby while you breastfeed.  Make sure that your baby's tummy (abdomen) is facing your abdomen.  Gently massage your breast. With your fingertips, massage from the outer edges of your breast inward toward the nipple. This encourages milk flow. If your milk flows slowly, you may need to continue this action during the feeding.  Support your breast with 4 fingers underneath and your thumb above your nipple (make the letter "C" with your hand). Make sure your fingers are well away from your nipple and your baby's mouth.  Stroke your baby's lips gently with your finger or nipple.  When your baby's mouth is open wide enough, quickly bring your baby to your breast, placing your entire nipple and as much of the areola as possible into your baby's mouth. The areola is the colored area around your nipple. ? More areola should be visible above your baby's upper lip than below the lower lip. ? Your baby's lips should be opened and extended outward (flanged) to ensure an adequate, comfortable latch. ? Your baby's tongue should be between his or her lower gum and your breast.  Make sure that your baby's mouth is correctly positioned around your nipple (latched). Your baby's lips should create a seal on your breast and be  turned out (everted).  It is common for your baby to suck about 2-3 minutes in order to start the flow of breast milk. Latching Teaching your baby how to latch onto your breast properly is very important. An improper latch can cause nipple pain, decreased milk supply, and poor weight gain in your baby. Also, if your baby is not latched onto your nipple properly, he or she may swallow some air during feeding. This can make your baby fussy. Burping your baby when you switch breasts during the feeding can help to get rid of the air. However, teaching your baby to latch on properly is still the best way to prevent fussiness from swallowing air while breastfeeding. Signs that your baby has successfully latched onto your nipple  Silent tugging or silent sucking, without causing you pain. Infant's lips should be extended outward (flanged).  Swallowing heard between  every 3-4 sucks once your milk has started to flow (after your let-down milk reflex occurs).  Muscle movement above and in front of his or her ears while sucking. Signs that your baby has not successfully latched onto your nipple  Sucking sounds or smacking sounds from your baby while breastfeeding.  Nipple pain. If you think your baby has not latched on correctly, slip your finger into the corner of your baby's mouth to break the suction and place it between your baby's gums. Attempt to start breastfeeding again. Signs of successful breastfeeding Signs from your baby  Your baby will gradually decrease the number of sucks or will completely stop sucking.  Your baby will fall asleep.  Your baby's body will relax.  Your baby will retain a small amount of milk in his or her mouth.  Your baby will let go of your breast by himself or herself. Signs from you  Breasts that have increased in firmness, weight, and size 1-3 hours after feeding.  Breasts that are softer immediately after breastfeeding.  Increased milk volume, as well as  a change in milk consistency and color by the fifth day of breastfeeding.  Nipples that are not sore, cracked, or bleeding. Signs that your baby is getting enough milk  Wetting at least 1-2 diapers during the first 24 hours after birth.  Wetting at least 5-6 diapers every 24 hours for the first week after birth. The urine should be clear or pale yellow by the age of 5 days.  Wetting 6-8 diapers every 24 hours as your baby continues to grow and develop.  At least 3 stools in a 24-hour period by the age of 5 days. The stool should be soft and yellow.  At least 3 stools in a 24-hour period by the age of 7 days. The stool should be seedy and yellow.  No loss of weight greater than 10% of birth weight during the first 3 days of life.  Average weight gain of 4-7 oz (113-198 g) per week after the age of 4 days.  Consistent daily weight gain by the age of 5 days, without weight loss after the age of 2 weeks. After a feeding, your baby may spit up a small amount of milk. This is normal. Breastfeeding frequency and duration Frequent feeding will help you make more milk and can prevent sore nipples and extremely full breasts (breast engorgement). Breastfeed when you feel the need to reduce the fullness of your breasts or when your baby shows signs of hunger. This is called "breastfeeding on demand." Signs that your baby is hungry include:  Increased alertness, activity, or restlessness.  Movement of the head from side to side.  Opening of the mouth when the corner of the mouth or cheek is stroked (rooting).  Increased sucking sounds, smacking lips, cooing, sighing, or squeaking.  Hand-to-mouth movements and sucking on fingers or hands.  Fussing or crying. Avoid introducing a pacifier to your baby in the first 4-6 weeks after your baby is born. After this time, you may choose to use a pacifier. Research has shown that pacifier use during the first year of a baby's life decreases the risk of  sudden infant death syndrome (SIDS). Allow your baby to feed on each breast as long as he or she wants. When your baby unlatches or falls asleep while feeding from the first breast, offer the second breast. Because newborns are often sleepy in the first few weeks of life, you may need to awaken  your baby to get him or her to feed. Breastfeeding times will vary from baby to baby. However, the following rules can serve as a guide to help you make sure that your baby is properly fed:  Newborns (babies 40 weeks of age or younger) may breastfeed every 1-3 hours.  Newborns should not go without breastfeeding for longer than 3 hours during the day or 5 hours during the night.  You should breastfeed your baby a minimum of 8 times in a 24-hour period. Breast milk pumping     Pumping and storing breast milk allows you to make sure that your baby is exclusively fed your breast milk, even at times when you are unable to breastfeed. This is especially important if you go back to work while you are still breastfeeding, or if you are not able to be present during feedings. Your lactation consultant can help you find a method of pumping that works best for you and give you guidelines about how long it is safe to store breast milk. Caring for your breasts while you breastfeed Nipples can become dry, cracked, and sore while breastfeeding. The following recommendations can help keep your breasts moisturized and healthy:  Avoid using soap on your nipples.  Wear a supportive bra designed especially for nursing. Avoid wearing underwire-style bras or extremely tight bras (sports bras).  Air-dry your nipples for 3-4 minutes after each feeding.  Use only cotton bra pads to absorb leaked breast milk. Leaking of breast milk between feedings is normal.  Use lanolin on your nipples after breastfeeding. Lanolin helps to maintain your skin's normal moisture barrier. Pure lanolin is not harmful (not toxic) to your baby. You  may also hand express a few drops of breast milk and gently massage that milk into your nipples and allow the milk to air-dry. In the first few weeks after giving birth, some women experience breast engorgement. Engorgement can make your breasts feel heavy, warm, and tender to the touch. Engorgement peaks within 3-5 days after you give birth. The following recommendations can help to ease engorgement:  Completely empty your breasts while breastfeeding or pumping. You may want to start by applying warm, moist heat (in the shower or with warm, water-soaked hand towels) just before feeding or pumping. This increases circulation and helps the milk flow. If your baby does not completely empty your breasts while breastfeeding, pump any extra milk after he or she is finished.  Apply ice packs to your breasts immediately after breastfeeding or pumping, unless this is too uncomfortable for you. To do this: ? Put ice in a plastic bag. ? Place a towel between your skin and the bag. ? Leave the ice on for 20 minutes, 2-3 times a day.  Make sure that your baby is latched on and positioned properly while breastfeeding. If engorgement persists after 48 hours of following these recommendations, contact your health care provider or a Science writer. Overall health care recommendations while breastfeeding  Eat 3 healthy meals and 3 snacks every day. Well-nourished mothers who are breastfeeding need an additional 450-500 calories a day. You can meet this requirement by increasing the amount of a balanced diet that you eat.  Drink enough water to keep your urine pale yellow or clear.  Rest often, relax, and continue to take your prenatal vitamins to prevent fatigue, stress, and low vitamin and mineral levels in your body (nutrient deficiencies).  Do not use any products that contain nicotine or tobacco, such as cigarettes and e-cigarettes.  Your baby may be harmed by chemicals from cigarettes that pass into  breast milk and exposure to secondhand smoke. If you need help quitting, ask your health care provider.  Avoid alcohol.  Do not use illegal drugs or marijuana.  Talk with your health care provider before taking any medicines. These include over-the-counter and prescription medicines as well as vitamins and herbal supplements. Some medicines that may be harmful to your baby can pass through breast milk.  It is possible to become pregnant while breastfeeding. If birth control is desired, ask your health care provider about options that will be safe while breastfeeding your baby. Where to find more information: [employer]: www.llli.org Contact a health care provider if:  You feel like you want to stop breastfeeding or have become frustrated with breastfeeding.  Your nipples are cracked or bleeding.  Your breasts are red, tender, or warm.  You have: ? Painful breasts or nipples. ? A swollen area on either breast. ? A fever or chills. ? Nausea or vomiting. ? Drainage other than breast milk from your nipples.  Your breasts do not become full before feedings by the fifth day after you give birth.  You feel sad and depressed.  Your baby is: ? Too sleepy to eat well. ? Having trouble sleeping. ? More than 29 week old and wetting fewer than 6 diapers in a 24-hour period. ? Not gaining weight by 39 days of age.  Your baby has fewer than 3 stools in a 24-hour period.  Your baby's skin or the white parts of his or her eyes become yellow. Get help right away if:  Your baby is overly tired (lethargic) and does not want to wake up and feed.  Your baby develops an unexplained fever. Summary  Breastfeeding offers many health benefits for infant and mothers.  Try to breastfeed your infant when he or she shows early signs of hunger.  Gently tickle or stroke your baby's lips with your finger or nipple to allow the baby to open his or her mouth. Bring the baby to your  breast. Make sure that much of the areola is in your baby's mouth. Offer one side and burp the baby before you offer the other side.  Talk with your health care provider or lactation consultant if you have questions or you face problems as you breastfeed. This information is not intended to replace advice given to you by your health care provider. Make sure you discuss any questions you have with your health care provider. Document Released: 10/09/2005 Document Revised: 11/10/2016 Document Reviewed: 11/10/2016 Elsevier Interactive Patient Education  2019 [employer] Pain Relief During Labor and Delivery Many things can cause pain during labor and delivery, including:  Pressure on bones and ligaments due to the baby moving through the pelvis.  Stretching of tissues due to the baby moving through the birth canal.  Muscle tension due to anxiety or nervousness.  The uterus tightening (contracting) and relaxing to help move the baby. There are many ways to deal with the pain of labor and delivery. They include:  Taking prenatal classes. Taking these classes helps you know what to expect during your baby's birth. What you learn will increase your confidence and decrease your anxiety.  Practicing relaxation techniques or doing relaxing activities, such as: ? Focused breathing. ? Meditation. ? Visualization. ? Aroma therapy. ? Listening to your favorite music. ? Hypnosis.  Taking a warm shower or bath (hydrotherapy). This may: ? Provide comfort and  relaxation. ? Lessen your perception of pain. ? Decrease the amount of pain medicine needed. ? Decrease the length of labor.  Getting a massage or counterpressure on your back.  Applying warm packs or ice packs.  Changing positions often, moving around, or using a birthing ball.  Getting: ? Pain medicine through an IV or injection into a muscle. ? Pain medicine inserted into your spinal column. ? Injections of sterile water just under  the skin on your lower back (intradermal injections). ? Laughing gas (nitrous oxide). Discuss your pain control options with your health care provider during your prenatal visits. Explore the options offered by your hospital or birth center. What kinds of medicine are available? There are two kinds of medicines that can be used to relieve pain during labor and delivery:  Analgesics. These medicines decrease pain without causing you to lose feeling or the ability to move your muscles.  Anesthetics. These medicines block feeling in the body and can decrease your ability to move freely. Both of these kinds of medicine can cause minor side effects, such as nausea, trouble concentrating, and sleepiness. They can also decrease the baby's heart rate before birth and affect the baby's breathing rate after birth. For this reason, health care providers are careful about when and how much medicine is given. What are specific medicines and procedures that provide pain relief? Local Anesthetics Local anesthetics are used to numb a small area of the body. They may be used along with another kind of anesthetic or used to numb the nerves of the vagina, cervix, and perineum during the second stage of labor. General Anesthetics General anesthetics cause you to lose consciousness so you do not feel pain. They are usually only used for an emergency cesarean delivery. General anesthetics are given through an IV tube and a mask. Pudendal Block A pudendal block is a form of local anesthetic. It may be used to relieve the pain associated with pushing or stretching of the perineum at the time of delivery or to further numb the perineum. A pudendal block is done by injecting numbing medicine through the vaginal wall into a nerve in the pelvis. Epidural Analgesia Epidural analgesia is given through a flexible IV catheter that is inserted into the lower back. Numbing medicine is delivered continuously to the area near your  spinal column nerves (epidural space). After having this type of analgesia, you may be able to move your legs but you most likely will not be able to walk. Depending on the amount of medicine given, you may lose all feeling in the lower half of your body, or you may retain some level of sensation, including the urge to push. Epidural analgesia can be used to provide pain relief for a vaginal birth. Spinal Block A spinal block is similar to epidural analgesia, but the medicine is injected into the spinal fluid instead of the epidural space. A spinal block is only given once. It starts to relieve pain quickly, but the pain relief lasts only 1-6 hours. Spinal blocks can be used for cesarean deliveries. Combined Spinal-Epidural (CSE) Block A CSE block combines the effects of a spinal block and epidural analgesia. The spinal block works quickly to block all pain. The epidural analgesia provides continuous pain relief, even after the effects of the spinal block have worn off. This information is not intended to replace advice given to you by your health care provider. Make sure you discuss any questions you have with your health care provider. Document  Released: 01/25/2009 Document Revised: 03/17/2016 Document Reviewed: 03/01/2016 Elsevier Interactive Patient Education  2019 [employer]. Fetal Movement Counts Patient Name: ________________________________________________ Patient Due Date: ____________________ What is a fetal movement count?  A fetal movement count is the number of times that you feel your baby move during a certain amount of time. This may also be called a fetal kick count. A fetal movement count is recommended for every pregnant woman. You may be asked to start counting fetal movements as early as week 28 of your pregnancy. Pay attention to when your baby is most active. You may notice your baby's sleep and wake cycles. You may also notice things that make your baby move more. You should  do a fetal movement count:  When your baby is normally most active.  At the same time each day. A good time to count movements is while you are resting, after having something to eat and drink. How do I count fetal movements? 1. Find a quiet, comfortable area. Sit, or lie down on your side. 2. Write down the date, the start time and stop time, and the number of movements that you felt between those two times. Take this information with you to your health care visits. 3. For 2 hours, count kicks, flutters, swishes, rolls, and jabs. You should feel at least 10 movements during 2 hours. 4. You may stop counting after you have felt 10 movements. 5. If you do not feel 10 movements in 2 hours, have something to eat and drink. Then, keep resting and counting for 1 hour. If you feel at least 4 movements during that hour, you may stop counting. Contact a health care provider if:  You feel fewer than 4 movements in 2 hours.  Your baby is not moving like he or she usually does. Date: ____________ Start time: ____________ Stop time: ____________ Movements: ____________ Date: ____________ Start time: ____________ Stop time: ____________ Movements: ____________ Date: ____________ Start time: ____________ Stop time: ____________ Movements: ____________ Date: ____________ Start time: ____________ Stop time: ____________ Movements: ____________ Date: ____________ Start time: ____________ Stop time: ____________ Movements: ____________ Date: ____________ Start time: ____________ Stop time: ____________ Movements: ____________ Date: ____________ Start time: ____________ Stop time: ____________ Movements: ____________ Date: ____________ Start time: ____________ Stop time: ____________ Movements: ____________ Date: ____________ Start time: ____________ Stop time: ____________ Movements: ____________ This information is not intended to replace advice given to you by your health care provider. Make sure you  discuss any questions you have with your health care provider. Document Released: 11/08/2006 Document Revised: 06/07/2016 Document Reviewed: 11/18/2015 Elsevier Interactive Patient Education  2019 [place] of Pregnancy  The third trimester is from week 28 through week 40 (months 7 through 9). This trimester is when your unborn baby (fetus) is growing very fast. At the end of the ninth month, the unborn baby is about 20 inches in length. It weighs about 6-10 pounds. Follow these instructions at home: Medicines  Take over-the-counter and prescription medicines only as told by your doctor. Some medicines are safe and some medicines are not safe during pregnancy.  Take a prenatal vitamin that contains at least 600 micrograms (mcg) of folic acid.  If you have trouble pooping (constipation), take medicine that will make your stool soft (stool softener) if your doctor approves. Eating and drinking   Eat regular, healthy meals.  Avoid raw meat and uncooked cheese.  If you get low calcium from the food you eat, talk to your doctor about taking a  daily calcium supplement.  Eat four or five small meals rather than three large meals a day.  Avoid foods that are high in fat and sugars, such as fried and sweet foods.  To prevent constipation: ? Eat foods that are high in fiber, like fresh fruits and vegetables, whole grains, and beans. ? Drink enough fluids to keep your pee (urine) clear or pale yellow. Activity  Exercise only as told by your doctor. Stop exercising if you start to have cramps.  Avoid heavy lifting, wear low heels, and sit up straight.  Do not exercise if it is too hot, too humid, or if you are in a place of great height (high altitude).  You may continue to have sex unless your doctor tells you not to. Relieving pain and discomfort  Wear a good support bra if your breasts are tender.  Take frequent breaks and rest with your legs raised if you have  leg cramps or low back pain.  Take warm water baths (sitz baths) to soothe pain or discomfort caused by hemorrhoids. Use hemorrhoid cream if your doctor approves.  If you develop puffy, bulging veins (varicose veins) in your legs: ? Wear support hose or compression stockings as told by your doctor. ? Raise (elevate) your feet for 15 minutes, 3-4 times a day. ? Limit salt in your food. Safety  Wear your seat belt when driving.  Make a list of emergency phone numbers, including numbers for family, friends, the hospital, and police and fire departments. Preparing for your baby's arrival To prepare for the arrival of your baby:  Take prenatal classes.  Practice driving to the hospital.  Visit the hospital and tour the maternity area.  Talk to your work about taking leave once the baby comes.  Pack your hospital bag.  Prepare the baby's room.  Go to your doctor visits.  Buy a rear-facing car seat. Learn how to install it in your car. General instructions  Do not use hot tubs, steam rooms, or saunas.  Do not use any products that contain nicotine or tobacco, such as cigarettes and e-cigarettes. If you need help quitting, ask your doctor.  Do not drink alcohol.  Do not douche or use tampons or scented sanitary pads.  Do not cross your legs for long periods of time.  Do not travel for long distances unless you must. Only do so if your doctor says it is okay.  Visit your dentist if you have not gone during your pregnancy. Use a soft toothbrush to brush your teeth. Be gentle when you floss.  Avoid cat litter boxes and soil used by cats. These carry germs that can cause birth defects in the baby and can cause a loss of your baby (miscarriage) or stillbirth.  Keep all your prenatal visits as told by your doctor. This is important. Contact a doctor if:  You are not sure if you are in labor or if your water has broken.  You are dizzy.  You have mild cramps or pressure in  your lower belly.  You have a nagging pain in your belly area.  You continue to feel sick to your stomach, you throw up, or you have watery poop.  You have bad smelling fluid coming from your vagina.  You have pain when you pee. Get help right away if:  You have a fever.  You are leaking fluid from your vagina.  You are spotting or bleeding from your vagina.  You have severe belly cramps  or pain.  You lose or gain weight quickly.  You have trouble catching your breath and have chest pain.  You notice sudden or extreme puffiness (swelling) of your face, hands, ankles, feet, or legs.  You have not felt the baby move in over an hour.  You have severe headaches that do not go away with medicine.  You have trouble seeing.  You are leaking, or you are having a gush of fluid, from your vagina before you are 37 weeks.  You have regular belly spasms (contractions) before you are 37 weeks. Summary  The third trimester is from week 28 through week 40 (months 7 through 9). This time is when your unborn baby is growing very fast.  Follow your doctor's advice about medicine, food, and activity.  Get ready for the arrival of your baby by taking prenatal classes, getting all the baby items ready, preparing the baby's room, and visiting your doctor to be checked.  Get help right away if you are bleeding from your vagina, or you have chest pain and trouble catching your breath, or if you have not felt your baby move in over an hour. This information is not intended to replace advice given to you by your health care provider. Make sure you discuss any questions you have with your health care provider. Document Released: 01/03/2010 Document Revised: 11/14/2016 Document Reviewed: 11/14/2016 Elsevier Interactive Patient Education  2019 [employer]. [facility]  [place], [place], [place] [address]  Phone: [phone]   [facility] (second location)  [address] [place], [place] [address]  Phone: [phone]   [facility]) [place], [place], [place] [address] Phone: [phone]   [place] [place],  [address]  Phone: [phone]

## 2019-02-15 LAB — CBC
Hematocrit: 32.7 % — ABNORMAL LOW (ref 34.0–46.6)
Hemoglobin: 11 g/dL — ABNORMAL LOW (ref 11.1–15.9)
MCH: 29.1 pg (ref 26.6–33.0)
MCHC: 33.6 g/dL (ref 31.5–35.7)
MCV: 87 fL (ref 79–97)
Platelets: 206 10*3/uL (ref 150–450)
RBC: 3.78 x10E6/uL (ref 3.77–5.28)
RDW: 12.5 % (ref 11.7–15.4)
WBC: 9 10*3/uL (ref 3.4–10.8)

## 2019-02-15 LAB — GLUCOSE, 1 HOUR GESTATIONAL: Gestational Diabetes Screen: 116 mg/dL (ref 65–139)

## 2019-02-15 LAB — RPR: RPR Ser Ql: NONREACTIVE

## 2019-03-11 ENCOUNTER — Telehealth: Payer: Self-pay

## 2019-03-11 NOTE — Telephone Encounter (Signed)
Coronavirus (COVID-19) Are you at risk?  Are you at risk for the Coronavirus (COVID-19)?  To be considered HIGH RISK for Coronavirus (COVID-19), you have to meet the following criteria:  . Traveled to [place], [place], [place], [place] or [place]; or in the [place] to [place], [place], [place], or [place]; and have fever, cough, and shortness of breath within the last 2 weeks of travel OR . Been in close contact with a person diagnosed with COVID-19 within the last 2 weeks and have fever, cough, and shortness of breath . IF YOU DO NOT MEET THESE CRITERIA, YOU ARE CONSIDERED LOW RISK FOR COVID-19.  What to do if you are HIGH RISK for COVID-19?  . If you are having a medical emergency, call 911. . Seek medical care right away. Before you go to a doctor's office, urgent care or emergency department, call ahead and tell them about your recent travel, contact with someone diagnosed with COVID-19, and your symptoms. You should receive instructions from your physician's office regarding next steps of care.  . When you arrive at healthcare provider, tell the healthcare staff immediately you have returned from visiting [place], [place], [place], [place] or [place]; or traveled in the [place] to [place], [place], [place], or [place]; in the last two weeks or you have been in close contact with a person diagnosed with COVID-19 in the last 2 weeks.   . Tell the health care staff about your symptoms: fever, cough and shortness of breath. . After you have been seen by a medical provider, you will be either: o Tested for (COVID-19) and discharged home on quarantine except to seek medical care if symptoms worsen, and asked to  - Stay home and avoid contact with others until you get your results (4-5 days)  - Avoid travel on public transportation if possible (such as bus, train, or airplane) or o Sent to the Emergency Department by EMS for evaluation, COVID-19 testing, and possible  admission depending on your condition and test results.  What to do if you are LOW RISK for COVID-19?  Reduce your risk of any infection by using the same precautions used for avoiding the common cold or flu:  . Wash your hands often with soap and warm water for at least 20 seconds.  If soap and water are not readily available, use an alcohol-based hand sanitizer with at least 60% alcohol.  . If coughing or sneezing, cover your mouth and nose by coughing or sneezing into the elbow areas of your shirt or coat, into a tissue or into your sleeve (not your hands). . Avoid shaking hands with others and consider head nods or verbal greetings only. . Avoid touching your eyes, nose, or mouth with unwashed hands.  . Avoid close contact with people who are sick. . Avoid places or events with large numbers of people in one location, like concerts or sporting events. . Carefully consider travel plans you have or are making. . If you are planning any travel outside or inside the [place], visit the CDC's Travelers' Health webpage for the latest health notices. . If you have some symptoms but not all symptoms, continue to monitor at home and seek medical attention if your symptoms worsen. . If you are having a medical emergency, call 911.   ADDITIONAL HEALTHCARE OPTIONS FOR PATIENTS  [facility] Tele[facility] / e-Visit: [email or web address]         [facility]: [phone]  [place]   [facility]: [phone]                   [facility]: [phone]   Pre-screen negative, DM.   

## 2019-03-12 ENCOUNTER — Encounter: Payer: Self-pay | Admitting: Certified Nurse Midwife

## 2019-03-12 ENCOUNTER — Other Ambulatory Visit: Payer: Self-pay

## 2019-03-12 ENCOUNTER — Ambulatory Visit (INDEPENDENT_AMBULATORY_CARE_PROVIDER_SITE_OTHER): Payer: Commercial Managed Care - PPO | Admitting: Certified Nurse Midwife

## 2019-03-12 VITALS — BP 113/72 | HR 87 | Wt 182.2 lb

## 2019-03-12 DIAGNOSIS — N898 Other specified noninflammatory disorders of vagina: Secondary | ICD-10-CM

## 2019-03-12 DIAGNOSIS — Z3493 Encounter for supervision of normal pregnancy, unspecified, third trimester: Secondary | ICD-10-CM

## 2019-03-12 LAB — POCT URINALYSIS DIPSTICK OB
Bilirubin, UA: NEGATIVE
Blood, UA: NEGATIVE
Glucose, UA: NEGATIVE
Ketones, UA: NEGATIVE
Leukocytes, UA: NEGATIVE
Nitrite, UA: NEGATIVE
POC,PROTEIN,UA: NEGATIVE
Spec Grav, UA: 1.02 (ref 1.010–1.025)
Urobilinogen, UA: 0.2 E.U./dL
pH, UA: 5 (ref 5.0–8.0)

## 2019-03-12 NOTE — Progress Notes (Signed)
ROB doing well. Feels good movement. PT concerned about bump that came up recently on her left labia. She denies history of HPV or HSV. She denies pain with bumps and admits to shaving. On exam cluster of 2-3 raised bumps noted small amount of oozing. Not enough d/c to culture. Non tender to palpation. Does not look like HPV wart. Discussed watchful waiting or doing lab work for HSV testing. She would like to be tested to R/O HSV. She has been using witch hazel on area. Pt advised to not shave area, use warm compress prn. Will follow up with results. Return 2 wks.   Philip Aspen, CNM

## 2019-03-12 NOTE — Patient Instructions (Signed)
How a Baby Grows During Pregnancy  Pregnancy begins when a female's sperm enters a female's egg (fertilization). Fertilization usually happens in one of the tubes (fallopian tubes) that connect the ovaries to the womb (uterus). The fertilized egg moves down the fallopian tube to the uterus. Once it reaches the uterus, it implants into the lining of the uterus and begins to grow. For the first 10 weeks, the fertilized egg is called an embryo. After 10 weeks, it is called a fetus. As the fetus continues to grow, it receives oxygen and nutrients through tissue (placenta) that grows to support the developing baby. The placenta is the life support system for the baby. It provides oxygen and nutrition and removes waste. Learning as much as you can about your pregnancy and how your baby is developing can help you enjoy the experience. It can also make you aware of when there might be a problem and when to ask questions. How long does a typical pregnancy last? A pregnancy usually lasts 280 days, or about 40 weeks. Pregnancy is divided into three periods of growth, also called trimesters:  First trimester: 0-12 weeks.  Second trimester: 13-27 weeks.  Third trimester: 28-40 weeks. The day when your baby is ready to be born (full term) is your estimated date of delivery. How does my baby develop month by month? First month  The fertilized egg attaches to the inside of the uterus.  Some cells will form the placenta. Others will form the fetus.  The arms, legs, brain, spinal cord, lungs, and heart begin to develop.  At the end of the first month, the heart begins to beat. Second month  The bones, inner ear, eyelids, hands, and feet form.  The genitals develop.  By the end of 8 weeks, all major organs are developing. Third month  All of the internal organs are forming.  Teeth develop below the gums.  Bones and muscles begin to grow. The spine can flex.  The skin is transparent.  Fingernails  and toenails begin to form.  Arms and legs continue to grow longer, and hands and feet develop.  The fetus is about 3 inches (7.6 cm) long. Fourth month  The placenta is completely formed.  The external sex organs, neck, outer ear, eyebrows, eyelids, and fingernails are formed.  The fetus can hear, swallow, and move its arms and legs.  The kidneys begin to produce urine.  The skin is covered with a white, waxy coating (vernix) and very fine hair (lanugo). Fifth month  The fetus moves around more and can be felt for the first time (quickening).  The fetus starts to sleep and wake up and may begin to suck its finger.  The nails grow to the end of the fingers.  The organ in the digestive system that makes bile (gallbladder) functions and helps to digest nutrients.  If your baby is a girl, eggs are present in her ovaries. If your baby is a boy, testicles start to move down into his scrotum. Sixth month  The lungs are formed.  The eyes open. The brain continues to develop.  Your baby has fingerprints and toe prints. Your baby's hair grows thicker.  At the end of the second trimester, the fetus is about 9 inches (22.9 cm) long. Seventh month  The fetus kicks and stretches.  The eyes are developed enough to sense changes in light.  The hands can make a grasping motion.  The fetus responds to sound. Eighth month  All  fetus is about 9 inches (22.9 cm) long.  Seventh month   The fetus kicks and stretches.   The eyes are developed enough to sense changes in light.   The hands can make a grasping motion.   The fetus responds to sound.  Eighth month   All organs and body systems are fully developed and functioning.   Bones harden, and taste buds develop. The fetus may hiccup.   Certain areas of the brain are still developing. The skull remains soft.  Ninth month   The fetus gains about  lb (0.23 kg) each week.   The lungs are fully developed.   Patterns of sleep develop.   The fetus's head typically moves into a head-down position (vertex) in the uterus to prepare for birth.   The fetus weighs 6-9 lb (2.72-4.08 kg) and is 19-20 inches (48.26-50.8 cm) long.  What can I do to have a healthy pregnancy and help  my baby develop?  General instructions   Take prenatal vitamins as directed by your health care provider. These include vitamins such as folic acid, iron, calcium, and vitamin D. They are important for healthy development.   Take medicines only as directed by your health care provider. Read labels and ask a pharmacist or your health care provider whether over-the-counter medicines, supplements, and prescription drugs are safe to take during pregnancy.   Keep all follow-up visits as directed by your health care provider. This is important. Follow-up visits include prenatal care and screening tests.  How do I know if my baby is developing well?  At each prenatal visit, your health care provider will do several different tests to check on your health and keep track of your baby's development. These include:   Fundal height and position.  ? Your health care provider will measure your growing belly from your pubic bone to the top of the uterus using a tape measure.  ? Your health care provider will also feel your belly to determine your baby's position.   Heartbeat.  ? An ultrasound in the first trimester can confirm pregnancy and show a heartbeat, depending on how far along you are.  ? Your health care provider will check your baby's heart rate at every prenatal visit.   Second trimester ultrasound.  ? This ultrasound checks your baby's development. It also may show your baby's gender.  What should I do if I have concerns about my baby's development?  Always talk with your health care provider about any concerns that you may have about your pregnancy and your baby.  Summary   A pregnancy usually lasts 280 days, or about 40 weeks. Pregnancy is divided into three periods of growth, also called trimesters.   Your health care provider will monitor your baby's growth and development throughout your pregnancy.   Follow your health care provider's recommendations about taking prenatal vitamins and medicines during  your pregnancy.   Talk with your health care provider if you have any concerns about your pregnancy or your developing baby.  This information is not intended to replace advice given to you by your health care provider. Make sure you discuss any questions you have with your health care provider.  Document Released: 03/27/2008 Document Revised: 08/22/2017 Document Reviewed: 08/22/2017  Elsevier Interactive Patient Education  2019 [employer]

## 2019-03-13 LAB — HSV 1 AND 2 IGM ABS, INDIRECT
HSV 1 IgM: <1:10 {titer}
HSV 2 IgM: <1:10 {titer}

## 2019-03-27 ENCOUNTER — Telehealth: Payer: Self-pay | Admitting: *Deleted

## 2019-03-27 NOTE — Telephone Encounter (Signed)
Coronavirus (COVID-19) Are you at risk?  Are you at risk for the Coronavirus (COVID-19)?  To be considered HIGH RISK for Coronavirus (COVID-19), you have to meet the following criteria:  . Traveled to [place], [place], [place], [place] or [place]; or in the [place] to [place], [place], [place], or [place]; and have fever, cough, and shortness of breath within the last 2 weeks of travel OR . Been in close contact with a person diagnosed with COVID-19 within the last 2 weeks and have fever, cough, and shortness of breath . IF YOU DO NOT MEET THESE CRITERIA, YOU ARE CONSIDERED LOW RISK FOR COVID-19.  What to do if you are HIGH RISK for COVID-19?  . If you are having a medical emergency, call 911. . Seek medical care right away. Before you go to a doctor's office, urgent care or emergency department, call ahead and tell them about your recent travel, contact with someone diagnosed with COVID-19, and your symptoms. You should receive instructions from your physician's office regarding next steps of care.  . When you arrive at healthcare provider, tell the healthcare staff immediately you have returned from visiting [place], [place], [place], [place] or [place]; or traveled in the [place] to [place], [place], [place], or [place]; in the last two weeks or you have been in close contact with a person diagnosed with COVID-19 in the last 2 weeks.   . Tell the health care staff about your symptoms: fever, cough and shortness of breath. . After you have been seen by a medical provider, you will be either: o Tested for (COVID-19) and discharged home on quarantine except to seek medical care if symptoms worsen, and asked to  - Stay home and avoid contact with others until you get your results (4-5 days)  - Avoid travel on public transportation if possible (such as bus, train, or airplane) or o Sent to the Emergency Department by EMS for evaluation, COVID-19 testing, and possible  admission depending on your condition and test results.  What to do if you are LOW RISK for COVID-19?  Reduce your risk of any infection by using the same precautions used for avoiding the common cold or flu:  . Wash your hands often with soap and warm water for at least 20 seconds.  If soap and water are not readily available, use an alcohol-based hand sanitizer with at least 60% alcohol.  . If coughing or sneezing, cover your mouth and nose by coughing or sneezing into the elbow areas of your shirt or coat, into a tissue or into your sleeve (not your hands). . Avoid shaking hands with others and consider head nods or verbal greetings only. . Avoid touching your eyes, nose, or mouth with unwashed hands.  . Avoid close contact with people who are sick. . Avoid places or events with large numbers of people in one location, like concerts or sporting events. . Carefully consider travel plans you have or are making. . If you are planning any travel outside or inside the [place], visit the CDC's Travelers' Health webpage for the latest health notices. . If you have some symptoms but not all symptoms, continue to monitor at home and seek medical attention if your symptoms worsen. . If you are having a medical emergency, call 911.   ADDITIONAL HEALTHCARE OPTIONS FOR PATIENTS   Telehealth / e-Visit: [email or web address]         [facility]: [phone]  [facility]: [phone]                   [facility]: [phone]   Spoke with pt denies any sx.  Takota Cahalan, CMA 

## 2019-03-28 ENCOUNTER — Other Ambulatory Visit: Payer: Self-pay

## 2019-03-28 ENCOUNTER — Ambulatory Visit (INDEPENDENT_AMBULATORY_CARE_PROVIDER_SITE_OTHER): Payer: Commercial Managed Care - PPO | Admitting: Obstetrics and Gynecology

## 2019-03-28 VITALS — BP 127/82 | HR 76 | Wt 187.9 lb

## 2019-03-28 DIAGNOSIS — Z3493 Encounter for supervision of normal pregnancy, unspecified, third trimester: Secondary | ICD-10-CM

## 2019-03-28 LAB — POCT URINALYSIS DIPSTICK OB
Bilirubin, UA: NEGATIVE
Blood, UA: NEGATIVE
Glucose, UA: NEGATIVE
Ketones, UA: NEGATIVE
Leukocytes, UA: NEGATIVE
Nitrite, UA: NEGATIVE
POC,PROTEIN,UA: NEGATIVE
Spec Grav, UA: 1.01 (ref 1.010–1.025)
Urobilinogen, UA: 0.2 E.U./dL
pH, UA: 6.5 (ref 5.0–8.0)

## 2019-03-28 NOTE — Progress Notes (Signed)
ROB- doing well, cultures next visit, hospital information given.

## 2019-03-28 NOTE — Progress Notes (Signed)
ROB- pt is doing well

## 2019-04-04 ENCOUNTER — Telehealth: Payer: Self-pay

## 2019-04-04 NOTE — Telephone Encounter (Signed)
Coronavirus (COVID-19) Are you at risk?  Are you at risk for the Coronavirus (COVID-19)?  To be considered HIGH RISK for Coronavirus (COVID-19), you have to meet the following criteria:  . Traveled to [place], [place], [place], [place] or [place]; or in the [place] to [place], [place], [place], or [place]; and have fever, cough, and shortness of breath within the last 2 weeks of travel OR . Been in close contact with a person diagnosed with COVID-19 within the last 2 weeks and have fever, cough, and shortness of breath . IF YOU DO NOT MEET THESE CRITERIA, YOU ARE CONSIDERED LOW RISK FOR COVID-19.  What to do if you are HIGH RISK for COVID-19?  . If you are having a medical emergency, call 911. . Seek medical care right away. Before you go to a doctor's office, urgent care or emergency department, call ahead and tell them about your recent travel, contact with someone diagnosed with COVID-19, and your symptoms. You should receive instructions from your physician's office regarding next steps of care.  . When you arrive at healthcare provider, tell the healthcare staff immediately you have returned from visiting [place], [place], [place], [place] or [place]; or traveled in the [place] to [place], [place], [place], or [place]; in the last two weeks or you have been in close contact with a person diagnosed with COVID-19 in the last 2 weeks.   . Tell the health care staff about your symptoms: fever, cough and shortness of breath. . After you have been seen by a medical provider, you will be either: o Tested for (COVID-19) and discharged home on quarantine except to seek medical care if symptoms worsen, and asked to  - Stay home and avoid contact with others until you get your results (4-5 days)  - Avoid travel on public transportation if possible (such as bus, train, or airplane) or o Sent to the Emergency Department by EMS for evaluation, COVID-19 testing, and possible  admission depending on your condition and test results.  What to do if you are LOW RISK for COVID-19?  Reduce your risk of any infection by using the same precautions used for avoiding the common cold or flu:  . Wash your hands often with soap and warm water for at least 20 seconds.  If soap and water are not readily available, use an alcohol-based hand sanitizer with at least 60% alcohol.  . If coughing or sneezing, cover your mouth and nose by coughing or sneezing into the elbow areas of your shirt or coat, into a tissue or into your sleeve (not your hands). . Avoid shaking hands with others and consider head nods or verbal greetings only. . Avoid touching your eyes, nose, or mouth with unwashed hands.  . Avoid close contact with people who are sick. . Avoid places or events with large numbers of people in one location, like concerts or sporting events. . Carefully consider travel plans you have or are making. . If you are planning any travel outside or inside the [place], visit the CDC's Travelers' Health webpage for the latest health notices. . If you have some symptoms but not all symptoms, continue to monitor at home and seek medical attention if your symptoms worsen. . If you are having a medical emergency, call 911.   ADDITIONAL HEALTHCARE OPTIONS FOR PATIENTS  [facility] / e-Visit: [email or web address]         [facility]: [phone]  [facility]: [phone]                   [facility]: [phone]   Pre-screen negative, DM.   

## 2019-04-07 ENCOUNTER — Ambulatory Visit (INDEPENDENT_AMBULATORY_CARE_PROVIDER_SITE_OTHER): Payer: Commercial Managed Care - PPO | Admitting: Certified Nurse Midwife

## 2019-04-07 ENCOUNTER — Other Ambulatory Visit: Payer: Self-pay

## 2019-04-07 VITALS — BP 141/86 | HR 65 | Wt 189.7 lb

## 2019-04-07 DIAGNOSIS — Z3493 Encounter for supervision of normal pregnancy, unspecified, third trimester: Secondary | ICD-10-CM

## 2019-04-07 DIAGNOSIS — Z113 Encounter for screening for infections with a predominantly sexual mode of transmission: Secondary | ICD-10-CM

## 2019-04-07 DIAGNOSIS — Z3685 Encounter for antenatal screening for Streptococcus B: Secondary | ICD-10-CM

## 2019-04-07 LAB — POCT URINALYSIS DIPSTICK OB
Bilirubin, UA: NEGATIVE
Blood, UA: NEGATIVE
Glucose, UA: NEGATIVE
Ketones, UA: NEGATIVE
Leukocytes, UA: NEGATIVE
Nitrite, UA: NEGATIVE
POC,PROTEIN,UA: NEGATIVE
Spec Grav, UA: 1.01 (ref 1.010–1.025)
Urobilinogen, UA: 0.2 E.U./dL
pH, UA: 7.5 (ref 5.0–8.0)

## 2019-04-07 NOTE — Progress Notes (Signed)
ROB-Reports intermittent pelvic pressure, request SVE exam. Discussed home treatment measures. Herbal prep handout given. 36 wk cultures collected. Anticipatory guidance regarding course of prenatal care. Reviewed red flag symptoms and when to call. RTC x 1 week for ROB or sooner if needed.

## 2019-04-07 NOTE — Addendum Note (Signed)
Addended by: Edrick Oh J on: 04/07/2019 03:57 PM   Modules accepted: Orders

## 2019-04-07 NOTE — Patient Instructions (Signed)

## 2019-04-07 NOTE — Progress Notes (Signed)
ROB-Patient c/o intermittent pelvic "pinching" x4 days.

## 2019-04-09 LAB — STREP GP B NAA: Strep Gp B NAA: NEGATIVE

## 2019-04-11 ENCOUNTER — Telehealth: Payer: Self-pay

## 2019-04-11 LAB — GC/CHLAMYDIA PROBE AMP
Chlamydia trachomatis, NAA: NEGATIVE
Neisseria Gonorrhoeae by PCR: NEGATIVE

## 2019-04-11 NOTE — Telephone Encounter (Signed)
Pt prescreened no symptoms has face mask.   Coronavirus (COVID-19) Are you at risk?  Are you at risk for the Coronavirus (COVID-19)?  To be considered HIGH RISK for Coronavirus (COVID-19), you have to meet the following criteria:  . Traveled to [place], [place], [place], [place] or [place]; or in the [place] to [place], [place], [place], or [place]; and have fever, cough, and shortness of breath within the last 2 weeks of travel OR . Been in close contact with a person diagnosed with COVID-19 within the last 2 weeks and have fever, cough, and shortness of breath . IF YOU DO NOT MEET THESE CRITERIA, YOU ARE CONSIDERED LOW RISK FOR COVID-19.  What to do if you are HIGH RISK for COVID-19?  . If you are having a medical emergency, call 911. . Seek medical care right away. Before you go to a doctor's office, urgent care or emergency department, call ahead and tell them about your recent travel, contact with someone diagnosed with COVID-19, and your symptoms. You should receive instructions from your physician's office regarding next steps of care.  . When you arrive at healthcare provider, tell the healthcare staff immediately you have returned from visiting [place], [place], [place], [place] or [place]; or traveled in the [place] to [place], [place], [place], or [place]; in the last two weeks or you have been in close contact with a person diagnosed with COVID-19 in the last 2 weeks.   . Tell the health care staff about your symptoms: fever, cough and shortness of breath. . After you have been seen by a medical provider, you will be either: o Tested for (COVID-19) and discharged home on quarantine except to seek medical care if symptoms worsen, and asked to  - Stay home and avoid contact with others until you get your results (4-5 days)  - Avoid travel on public transportation if possible (such as bus, train, or airplane) or o Sent to the Emergency Department by EMS for  evaluation, COVID-19 testing, and possible admission depending on your condition and test results.  What to do if you are LOW RISK for COVID-19?  Reduce your risk of any infection by using the same precautions used for avoiding the common cold or flu:  . Wash your hands often with soap and warm water for at least 20 seconds.  If soap and water are not readily available, use an alcohol-based hand sanitizer with at least 60% alcohol.  . If coughing or sneezing, cover your mouth and nose by coughing or sneezing into the elbow areas of your shirt or coat, into a tissue or into your sleeve (not your hands). . Avoid shaking hands with others and consider head nods or verbal greetings only. . Avoid touching your eyes, nose, or mouth with unwashed hands.  . Avoid close contact with people who are sick. . Avoid places or events with large numbers of people in one location, like concerts or sporting events. . Carefully consider travel plans you have or are making. . If you are planning any travel outside or inside the [place], visit the CDC's Travelers' Health webpage for the latest health notices. . If you have some symptoms but not all symptoms, continue to monitor at home and seek medical attention if your symptoms worsen. . If you are having a medical emergency, call 911.   ADDITIONAL HEALTHCARE OPTIONS FOR PATIENTS  [facility] / e-Visit: [email or web address]           [facility]: [phone]  [facility]: [phone]                   [facility]: [phone]  

## 2019-04-14 ENCOUNTER — Encounter: Payer: Self-pay | Admitting: Certified Nurse Midwife

## 2019-04-14 ENCOUNTER — Ambulatory Visit (INDEPENDENT_AMBULATORY_CARE_PROVIDER_SITE_OTHER): Payer: Commercial Managed Care - PPO | Admitting: Certified Nurse Midwife

## 2019-04-14 ENCOUNTER — Other Ambulatory Visit: Payer: Self-pay

## 2019-04-14 VITALS — BP 136/88 | HR 71 | Wt 190.3 lb

## 2019-04-14 DIAGNOSIS — Z3493 Encounter for supervision of normal pregnancy, unspecified, third trimester: Secondary | ICD-10-CM

## 2019-04-14 LAB — POCT URINALYSIS DIPSTICK OB
Bilirubin, UA: NEGATIVE
Blood, UA: NEGATIVE
Glucose, UA: NEGATIVE
Ketones, UA: NEGATIVE
Leukocytes, UA: NEGATIVE
Nitrite, UA: NEGATIVE
POC,PROTEIN,UA: NEGATIVE
Spec Grav, UA: 1.02 (ref 1.010–1.025)
Urobilinogen, UA: 0.2 E.U./dL
pH, UA: 6.5 (ref 5.0–8.0)

## 2019-04-14 NOTE — Patient Instructions (Signed)
Braxton Hicks Contractions Contractions of the uterus can occur throughout pregnancy, but they are not always a sign that you are in labor. You may have practice contractions called Braxton Hicks contractions. These false labor contractions are sometimes confused with true labor. What are Braxton Hicks contractions? Braxton Hicks contractions are tightening movements that occur in the muscles of the uterus before labor. Unlike true labor contractions, these contractions do not result in opening (dilation) and thinning of the cervix. Toward the end of pregnancy (32-34 weeks), Braxton Hicks contractions can happen more often and may become stronger. These contractions are sometimes difficult to tell apart from true labor because they can be very uncomfortable. You should not feel embarrassed if you go to the hospital with false labor. Sometimes, the only way to tell if you are in true labor is for your health care provider to look for changes in the cervix. The health care provider will do a physical exam and may monitor your contractions. If you are not in true labor, the exam should show that your cervix is not dilating and your water has not broken. If there are no other health problems associated with your pregnancy, it is completely safe for you to be sent home with false labor. You may continue to have Braxton Hicks contractions until you go into true labor. How to tell the difference between true labor and false labor True labor  Contractions last 30-70 seconds.  Contractions become very regular.  Discomfort is usually felt in the top of the uterus, and it spreads to the lower abdomen and low back.  Contractions do not go away with walking.  Contractions usually become more intense and increase in frequency.  The cervix dilates and gets thinner. False labor  Contractions are usually shorter and not as strong as true labor contractions.  Contractions are usually irregular.  Contractions  are often felt in the front of the lower abdomen and in the groin.  Contractions may go away when you walk around or change positions while lying down.  Contractions get weaker and are shorter-lasting as time goes on.  The cervix usually does not dilate or become thin. Follow these instructions at home:   Take over-the-counter and prescription medicines only as told by your health care provider.  Keep up with your usual exercises and follow other instructions from your health care provider.  Eat and drink lightly if you think you are going into labor.  If Braxton Hicks contractions are making you uncomfortable: ? Change your position from lying down or resting to walking, or change from walking to resting. ? Sit and rest in a tub of warm water. ? Drink enough fluid to keep your urine pale yellow. Dehydration may cause these contractions. ? Do slow and deep breathing several times an hour.  Keep all follow-up prenatal visits as told by your health care provider. This is important. Contact a health care provider if:  You have a fever.  You have continuous pain in your abdomen. Get help right away if:  Your contractions become stronger, more regular, and closer together.  You have fluid leaking or gushing from your vagina.  You pass blood-tinged mucus (bloody show).  You have bleeding from your vagina.  You have low back pain that you never had before.  You feel your baby's head pushing down and causing pelvic pressure.  Your baby is not moving inside you as much as it used to. Summary  Contractions that occur before labor are   called Braxton Hicks contractions, false labor, or practice contractions.  Braxton Hicks contractions are usually shorter, weaker, farther apart, and less regular than true labor contractions. True labor contractions usually become progressively stronger and regular, and they become more frequent.  Manage discomfort from Braxton Hicks contractions  by changing position, resting in a warm bath, drinking plenty of water, or practicing deep breathing. This information is not intended to replace advice given to you by your health care provider. Make sure you discuss any questions you have with your health care provider. Document Released: 02/22/2017 Document Revised: 07/24/2017 Document Reviewed: 02/22/2017 Elsevier Interactive Patient Education  2019 [employer]  

## 2019-04-14 NOTE — Progress Notes (Addendum)
ROB doing well. Feels good fetal movement. Reviewed labor precautions. SVE per pt request, unable to reach cervix. 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 Repeat BP 130/88.  Discussed use of herbal prep. She verbalizes and agrees to plan. Follow up 1 wk.   Philip Aspen, CNM

## 2019-04-21 ENCOUNTER — Telehealth: Payer: Self-pay

## 2019-04-21 NOTE — Telephone Encounter (Signed)
Coronavirus (COVID-19) Are you at risk?  Are you at risk for the Coronavirus (COVID-19)?  To be considered HIGH RISK for Coronavirus (COVID-19), you have to meet the following criteria:  . Traveled to [place], [place], [place], [place] or [place]; or in the [place] to [place], [place], [place], or [place]; and have fever, cough, and shortness of breath within the last 2 weeks of travel OR . Been in close contact with a person diagnosed with COVID-19 within the last 2 weeks and have fever, cough, and shortness of breath . IF YOU DO NOT MEET THESE CRITERIA, YOU ARE CONSIDERED LOW RISK FOR COVID-19.  What to do if you are HIGH RISK for COVID-19?  . If you are having a medical emergency, call 911. . Seek medical care right away. Before you go to a doctor's office, urgent care or emergency department, call ahead and tell them about your recent travel, contact with someone diagnosed with COVID-19, and your symptoms. You should receive instructions from your physician's office regarding next steps of care.  . When you arrive at healthcare provider, tell the healthcare staff immediately you have returned from visiting [place], [place], [place], [place] or [place]; or traveled in the [place] to [place], [place], [place], or [place]; in the last two weeks or you have been in close contact with a person diagnosed with COVID-19 in the last 2 weeks.   . Tell the health care staff about your symptoms: fever, cough and shortness of breath. . After you have been seen by a medical provider, you will be either: o Tested for (COVID-19) and discharged home on quarantine except to seek medical care if symptoms worsen, and asked to  - Stay home and avoid contact with others until you get your results (4-5 days)  - Avoid travel on public transportation if possible (such as bus, train, or airplane) or o Sent to the Emergency Department by EMS for evaluation, COVID-19 testing, and possible  admission depending on your condition and test results.  What to do if you are LOW RISK for COVID-19?  Reduce your risk of any infection by using the same precautions used for avoiding the common cold or flu:  . Wash your hands often with soap and warm water for at least 20 seconds.  If soap and water are not readily available, use an alcohol-based hand sanitizer with at least 60% alcohol.  . If coughing or sneezing, cover your mouth and nose by coughing or sneezing into the elbow areas of your shirt or coat, into a tissue or into your sleeve (not your hands). . Avoid shaking hands with others and consider head nods or verbal greetings only. . Avoid touching your eyes, nose, or mouth with unwashed hands.  . Avoid close contact with people who are sick. . Avoid places or events with large numbers of people in one location, like concerts or sporting events. . Carefully consider travel plans you have or are making. . If you are planning any travel outside or inside the [place], visit the CDC's Travelers' Health webpage for the latest health notices. . If you have some symptoms but not all symptoms, continue to monitor at home and seek medical attention if your symptoms worsen. . If you are having a medical emergency, call 911.   ADDITIONAL HEALTHCARE OPTIONS FOR PATIENTS  [facility] / e-Visit: [email or web address]         [facility]: [phone]  [facility]: [phone]                   [facility]: [phone]   Pre-screen negative, DM.   

## 2019-04-22 ENCOUNTER — Other Ambulatory Visit: Payer: Self-pay

## 2019-04-22 ENCOUNTER — Ambulatory Visit (INDEPENDENT_AMBULATORY_CARE_PROVIDER_SITE_OTHER): Payer: Commercial Managed Care - PPO | Admitting: Obstetrics and Gynecology

## 2019-04-22 ENCOUNTER — Encounter: Payer: Commercial Managed Care - PPO | Admitting: Obstetrics and Gynecology

## 2019-04-22 VITALS — BP 136/87 | HR 74 | Wt 192.2 lb

## 2019-04-22 DIAGNOSIS — Z3493 Encounter for supervision of normal pregnancy, unspecified, third trimester: Secondary | ICD-10-CM

## 2019-04-22 LAB — POCT URINALYSIS DIPSTICK OB
Bilirubin, UA: NEGATIVE
Blood, UA: NEGATIVE
Glucose, UA: NEGATIVE
Ketones, UA: NEGATIVE
Leukocytes, UA: NEGATIVE
Nitrite, UA: NEGATIVE
POC,PROTEIN,UA: NEGATIVE
Spec Grav, UA: 1.015 (ref 1.010–1.025)
Urobilinogen, UA: 0.2 E.U./dL
pH, UA: 6 (ref 5.0–8.0)

## 2019-04-22 NOTE — Progress Notes (Signed)
ROB- discussed labor precautions,

## 2019-04-22 NOTE — Progress Notes (Signed)
ROB- pt is having pelvic pressure

## 2019-04-28 ENCOUNTER — Telehealth: Payer: Self-pay

## 2019-04-28 NOTE — Telephone Encounter (Signed)
Coronavirus (COVID-19) Are you at risk?  Are you at risk for the Coronavirus (COVID-19)?  To be considered HIGH RISK for Coronavirus (COVID-19), you have to meet the following criteria:  . Traveled to [place], [place], [place], [place] or [place]; or in the [place] to [place], [place], [place], or [place]; and have fever, cough, and shortness of breath within the last 2 weeks of travel OR . Been in close contact with a person diagnosed with COVID-19 within the last 2 weeks and have fever, cough, and shortness of breath . IF YOU DO NOT MEET THESE CRITERIA, YOU ARE CONSIDERED LOW RISK FOR COVID-19.  What to do if you are HIGH RISK for COVID-19?  . If you are having a medical emergency, call 911. . Seek medical care right away. Before you go to a doctor's office, urgent care or emergency department, call ahead and tell them about your recent travel, contact with someone diagnosed with COVID-19, and your symptoms. You should receive instructions from your physician's office regarding next steps of care.  . When you arrive at healthcare provider, tell the healthcare staff immediately you have returned from visiting [place], [place], [place], [place] or [place]; or traveled in the [place] to [place], [place], [place], or [place]; in the last two weeks or you have been in close contact with a person diagnosed with COVID-19 in the last 2 weeks.   . Tell the health care staff about your symptoms: fever, cough and shortness of breath. . After you have been seen by a medical provider, you will be either: o Tested for (COVID-19) and discharged home on quarantine except to seek medical care if symptoms worsen, and asked to  - Stay home and avoid contact with others until you get your results (4-5 days)  - Avoid travel on public transportation if possible (such as bus, train, or airplane) or o Sent to the Emergency Department by EMS for evaluation, COVID-19 testing, and possible  admission depending on your condition and test results.  What to do if you are LOW RISK for COVID-19?  Reduce your risk of any infection by using the same precautions used for avoiding the common cold or flu:  . Wash your hands often with soap and warm water for at least 20 seconds.  If soap and water are not readily available, use an alcohol-based hand sanitizer with at least 60% alcohol.  . If coughing or sneezing, cover your mouth and nose by coughing or sneezing into the elbow areas of your shirt or coat, into a tissue or into your sleeve (not your hands). . Avoid shaking hands with others and consider head nods or verbal greetings only. . Avoid touching your eyes, nose, or mouth with unwashed hands.  . Avoid close contact with people who are sick. . Avoid places or events with large numbers of people in one location, like concerts or sporting events. . Carefully consider travel plans you have or are making. . If you are planning any travel outside or inside the [place], visit the CDC's Travelers' Health webpage for the latest health notices. . If you have some symptoms but not all symptoms, continue to monitor at home and seek medical attention if your symptoms worsen. . If you are having a medical emergency, call 911.   ADDITIONAL HEALTHCARE OPTIONS FOR PATIENTS  [facility] / e-Visit: [email or web address]         [facility]: [phone]  [facility]: [phone]                   [facility]: [phone]   Pre-screen negative, DM.   

## 2019-04-29 ENCOUNTER — Ambulatory Visit (INDEPENDENT_AMBULATORY_CARE_PROVIDER_SITE_OTHER): Payer: Commercial Managed Care - PPO | Admitting: Certified Nurse Midwife

## 2019-04-29 ENCOUNTER — Other Ambulatory Visit: Payer: Self-pay

## 2019-04-29 VITALS — BP 141/85 | HR 78 | Wt 194.1 lb

## 2019-04-29 DIAGNOSIS — O480 Post-term pregnancy: Secondary | ICD-10-CM

## 2019-04-29 DIAGNOSIS — Z3493 Encounter for supervision of normal pregnancy, unspecified, third trimester: Secondary | ICD-10-CM

## 2019-04-29 LAB — POCT URINALYSIS DIPSTICK OB
Bilirubin, UA: NEGATIVE
Blood, UA: NEGATIVE
Glucose, UA: NEGATIVE
Ketones, UA: NEGATIVE
Leukocytes, UA: NEGATIVE
Nitrite, UA: NEGATIVE
POC,PROTEIN,UA: NEGATIVE
Spec Grav, UA: 1.01 (ref 1.010–1.025)
Urobilinogen, UA: 0.2 E.U./dL
pH, UA: 7 (ref 5.0–8.0)

## 2019-04-29 NOTE — Progress Notes (Signed)
ROB-Patient c/o 1 episode of severe sharp pain in pelvic area 4 days ago that lasted for about 20 seconds.

## 2019-04-29 NOTE — Patient Instructions (Addendum)
Fetal Movement Counts Patient Name: ________________________________________________ Patient Due Date: ____________________ What is a fetal movement count?  A fetal movement count is the number of times that you feel your baby move during a certain amount of time. This may also be called a fetal kick count. A fetal movement count is recommended for every pregnant woman. You may be asked to start counting fetal movements as early as week 28 of your pregnancy. Pay attention to when your baby is most active. You may notice your baby's sleep and wake cycles. You may also notice things that make your baby move more. You should do a fetal movement count:  When your baby is normally most active.  At the same time each day. A good time to count movements is while you are resting, after having something to eat and drink. How do I count fetal movements? 1. Find a quiet, comfortable area. Sit, or lie down on your side. 2. Write down the date, the start time and stop time, and the number of movements that you felt between those two times. Take this information with you to your health care visits. 3. For 2 hours, count kicks, flutters, swishes, rolls, and jabs. You should feel at least 10 movements during 2 hours. 4. You may stop counting after you have felt 10 movements. 5. If you do not feel 10 movements in 2 hours, have something to eat and drink. Then, keep resting and counting for 1 hour. If you feel at least 4 movements during that hour, you may stop counting. Contact a health care provider if:  You feel fewer than 4 movements in 2 hours.  Your baby is not moving like he or she usually does. Date: ____________ Start time: ____________ Stop time: ____________ Movements: ____________ Date: ____________ Start time: ____________ Stop time: ____________ Movements: ____________ Date: ____________ Start time: ____________ Stop time: ____________ Movements: ____________ Date: ____________ Start time:  ____________ Stop time: ____________ Movements: ____________ Date: ____________ Start time: ____________ Stop time: ____________ Movements: ____________ Date: ____________ Start time: ____________ Stop time: ____________ Movements: ____________ Date: ____________ Start time: ____________ Stop time: ____________ Movements: ____________ Date: ____________ Start time: ____________ Stop time: ____________ Movements: ____________ Date: ____________ Start time: ____________ Stop time: ____________ Movements: ____________ This information is not intended to replace advice given to you by your health care provider. Make sure you discuss any questions you have with your health care provider. Document Released: 11/08/2006 Document Revised: 10/29/2018 Document Reviewed: 11/18/2015 Elsevier Patient Education  2020 [employer]. Vaginal Delivery  Vaginal delivery means that you give birth by pushing your baby out of your birth canal (vagina). A team of health care providers will help you before, during, and after vaginal delivery. Birth experiences are unique for every woman and every pregnancy, and birth experiences vary depending on where you choose to give birth. What happens when I arrive at the birth center or hospital? Once you are in labor and have been admitted into the hospital or birth center, your health care provider may:  Review your pregnancy history and any concerns that you have.  Insert an IV into one of your veins. This may be used to give you fluids and medicines.  Check your blood pressure, pulse, temperature, and heart rate (vital signs).  Check whether your bag of water (amniotic sac) has broken (ruptured).  Talk with you about your birth plan and discuss pain control options. Monitoring Your health care provider may monitor your contractions (uterine monitoring) and your baby's heart  rate (fetal monitoring). You may need to be monitored:  Often, but not continuously  (intermittently).  All the time or for long periods at a time (continuously). Continuous monitoring may be needed if: ? You are taking certain medicines, such as medicine to relieve pain or make your contractions stronger. ? You have pregnancy or labor complications. Monitoring may be done by:  Placing a special stethoscope or a handheld monitoring device on your abdomen to check your baby's heartbeat and to check for contractions.  Placing monitors on your abdomen (external monitors) to record your baby's heartbeat and the frequency and length of contractions.  Placing monitors inside your uterus through your vagina (internal monitors) to record your baby's heartbeat and the frequency, length, and strength of your contractions. Depending on the type of monitor, it may remain in your uterus or on your baby's head until birth.  Telemetry. This is a type of continuous monitoring that can be done with external or internal monitors. Instead of having to stay in bed, you are able to move around during telemetry. Physical exam Your health care provider may perform frequent physical exams. This may include:  Checking how and where your baby is positioned in your uterus.  Checking your cervix to determine: ? Whether it is thinning out (effacing). ? Whether it is opening up (dilating). What happens during labor and delivery?  Normal labor and delivery is divided into the following three stages: Stage 1  This is the longest stage of labor.  This stage can last for hours or days.  Throughout this stage, you will feel contractions. Contractions generally feel mild, infrequent, and irregular at first. They get stronger, more frequent (about every 2-3 minutes), and more regular as you move through this stage.  This stage ends when your cervix is completely dilated to 4 inches (10 cm) and completely effaced. Stage 2  This stage starts once your cervix is completely effaced and dilated and lasts  until the delivery of your baby.  This stage may last from 20 minutes to 2 hours.  This is the stage where you will feel an urge to push your baby out of your vagina.  You may feel stretching and burning pain, especially when the widest part of your baby's head passes through the vaginal opening (crowning).  Once your baby is delivered, the umbilical cord will be clamped and cut. This usually occurs after waiting a period of 1-2 minutes after delivery.  Your baby will be placed on your bare chest (skin-to-skin contact) in an upright position and covered with a warm blanket. Watch your baby for feeding cues, like rooting or sucking, and help the baby to your breast for his or her first feeding. Stage 3  This stage starts immediately after the birth of your baby and ends after you deliver the placenta.  This stage may take anywhere from 5 to 30 minutes.  After your baby has been delivered, you will feel contractions as your body expels the placenta and your uterus contracts to control bleeding. What can I expect after labor and delivery?  After labor is over, you and your baby will be monitored closely until you are ready to go home to ensure that you are both healthy. Your health care team will teach you how to care for yourself and your baby.  You and your baby will stay in the same room (rooming in) during your hospital stay. This will encourage early bonding and successful breastfeeding.  You may continue  to receive fluids and medicines through an IV.  Your uterus will be checked and massaged regularly (fundal massage).  You will have some soreness and pain in your abdomen, vagina, and the area of skin between your vaginal opening and your anus (perineum).  If an incision was made near your vagina (episiotomy) or if you had some vaginal tearing during delivery, cold compresses may be placed on your episiotomy or your tear. This helps to reduce pain and swelling.  You may be given a  squirt bottle to use instead of wiping when you go to the bathroom. To use the squirt bottle, follow these steps: ? Before you urinate, fill the squirt bottle with warm water. Do not use hot water. ? After you urinate, while you are sitting on the toilet, use the squirt bottle to rinse the area around your urethra and vaginal opening. This rinses away any urine and blood. ? Fill the squirt bottle with clean water every time you use the bathroom.  It is normal to have vaginal bleeding after delivery. Wear a sanitary pad for vaginal bleeding and discharge. Summary  Vaginal delivery means that you will give birth by pushing your baby out of your birth canal (vagina).  Your health care provider may monitor your contractions (uterine monitoring) and your baby's heart rate (fetal monitoring).  Your health care provider may perform a physical exam.  Normal labor and delivery is divided into three stages.  After labor is over, you and your baby will be monitored closely until you are ready to go home. This information is not intended to replace advice given to you by your health care provider. Make sure you discuss any questions you have with your health care provider. Document Released: 07/18/2008 Document Revised: 11/13/2017 Document Reviewed: 11/13/2017 Elsevier Patient Education  [employer].  Preeclampsia and Eclampsia Preeclampsia is a serious condition that may develop during pregnancy. This condition causes high blood pressure and increased protein in your urine along with other symptoms, such as headaches and vision changes. These symptoms may develop as the condition gets worse. Preeclampsia may occur at 20 weeks of pregnancy or later. Diagnosing and treating preeclampsia early is very important. If not treated early, it can cause serious problems for you and your baby. One problem it can lead to is eclampsia. Eclampsia is a condition that causes muscle jerking or shaking (convulsions  or seizures) and other serious problems for the mother. During pregnancy, delivering your baby may be the best treatment for preeclampsia or eclampsia. For most women, preeclampsia and eclampsia symptoms go away after giving birth. In rare cases, a woman may develop preeclampsia after giving birth (postpartum preeclampsia). This usually occurs within 48 hours after childbirth but may occur up to 6 weeks after giving birth. What are the causes? The cause of preeclampsia is not known. What increases the risk? The following risk factors make you more likely to develop preeclampsia:  Being pregnant for the first time.  Having had preeclampsia during a past pregnancy.  Having a family history of preeclampsia.  Having high blood pressure.  Being pregnant with more than one baby.  Being 52 or older.  Being African-American.  Having kidney disease or diabetes.  Having medical conditions such as lupus or blood diseases.  Being very overweight (obese). What are the signs or symptoms? The most common symptoms are:  Severe headaches.  Vision problems, such as blurred or double vision.  Abdominal pain, especially upper abdominal pain. Other symptoms that may  develop as the condition gets worse include:  Sudden weight gain.  Sudden swelling of the hands, face, legs, and feet.  Severe nausea and vomiting.  Numbness in the face, arms, legs, and feet.  Dizziness.  Urinating less than usual.  Slurred speech.  Convulsions or seizures. How is this diagnosed? There are no screening tests for preeclampsia. Your health care provider will ask you about symptoms and check for signs of preeclampsia during your prenatal visits. You may also have tests that include:  Checking your blood pressure.  Urine tests to check for protein. Your health care provider will check for this at every prenatal visit.  Blood tests.  Monitoring your baby's heart rate.  Ultrasound. How is this  treated? You and your health care provider will determine the treatment approach that is best for you. Treatment may include:  Having more frequent prenatal exams to check for signs of preeclampsia, if you have an increased risk for preeclampsia.  Medicine to lower your blood pressure.  Staying in the hospital, if your condition is severe. There, treatment will focus on controlling your blood pressure and the amount of fluids in your body (fluid retention).  Taking medicine (magnesium sulfate) to prevent seizures. This may be given as an injection or through an IV.  Taking a low-dose aspirin during your pregnancy.  Delivering your baby early. You may have your labor started with medicine (induced), or you may have a cesarean delivery. Follow these instructions at home: Eating and drinking   Drink enough fluid to keep your urine pale yellow.  Avoid caffeine. Lifestyle  Do not use any products that contain nicotine or tobacco, such as cigarettes and e-cigarettes. If you need help quitting, ask your health care provider.  Do not use alcohol or drugs.  Avoid stress as much as possible. Rest and get plenty of sleep. General instructions  Take over-the-counter and prescription medicines only as told by your health care provider.  When lying down, lie on your left side. This keeps pressure off your major blood vessels.  When sitting or lying down, raise (elevate) your feet. Try putting some pillows underneath your lower legs.  Exercise regularly. Ask your health care provider what kinds of exercise are best for you.  Keep all follow-up and prenatal visits as told by your health care provider. This is important. How is this prevented? There is no known way of preventing preeclampsia or eclampsia from developing. However, to lower your risk of complications and detect problems early:  Get regular prenatal care. Your health care provider may be able to diagnose and treat the condition  early.  Maintain a healthy weight. Ask your health care provider for help managing weight gain during pregnancy.  Work with your health care provider to manage any long-term (chronic) health conditions you have, such as diabetes or kidney problems.  You may have tests of your blood pressure and kidney function after giving birth.  Your health care provider may have you take low-dose aspirin during your next pregnancy. Contact a health care provider if:  You have symptoms that your health care provider told you may require more treatment or monitoring, such as: ? Headaches. ? Nausea or vomiting. ? Abdominal pain. ? Dizziness. ? Light-headedness. Get help right away if:  You have severe: ? Abdominal pain. ? Headaches that do not get better. ? Dizziness. ? Vision problems. ? Confusion. ? Nausea or vomiting.  You have any of the following: ? A seizure. ? Sudden, rapid weight  gain. ? Sudden swelling in your hands, ankles, or face. ? Trouble moving any part of your body. ? Numbness in any part of your body. ? Trouble speaking. ? Abnormal bleeding.  You faint. Summary  Preeclampsia is a serious condition that may develop during pregnancy.  This condition causes high blood pressure and increased protein in your urine along with other symptoms, such as headaches and vision changes.  Diagnosing and treating preeclampsia early is very important. If not treated early, it can cause serious problems for you and your baby.  Get help right away if you have symptoms that your health care provider told you to watch for. This information is not intended to replace advice given to you by your health care provider. Make sure you discuss any questions you have with your health care provider. Document Released: 10/06/2000 Document Revised: 06/11/2018 Document Reviewed: 05/15/2016 Elsevier Patient Education  2020 [employer].

## 2019-04-29 NOTE — Progress Notes (Signed)
ROB- Using home labor preparation techniques. Requests SVE, unchanged from previous visit. Anticipatory guidance regarding postdates care. Advised daily kick counts. Reviewed red flag symptoms and when to call. RTC x 1 week for BPP/Growth ultrasound and ROB or sooner if needed.

## 2019-05-05 ENCOUNTER — Telehealth: Payer: Self-pay

## 2019-05-05 NOTE — Telephone Encounter (Signed)
Coronavirus (COVID-19) Are you at risk?  Are you at risk for the Coronavirus (COVID-19)?  To be considered HIGH RISK for Coronavirus (COVID-19), you have to meet the following criteria:  . Traveled to [place], [place], [place], [place] or [place]; or in the [place] to [place], [place], [place], or [place]; and have fever, cough, and shortness of breath within the last 2 weeks of travel OR . Been in close contact with a person diagnosed with COVID-19 within the last 2 weeks and have fever, cough, and shortness of breath . IF YOU DO NOT MEET THESE CRITERIA, YOU ARE CONSIDERED LOW RISK FOR COVID-19.  What to do if you are HIGH RISK for COVID-19?  . If you are having a medical emergency, call 911. . Seek medical care right away. Before you go to a doctor's office, urgent care or emergency department, call ahead and tell them about your recent travel, contact with someone diagnosed with COVID-19, and your symptoms. You should receive instructions from your physician's office regarding next steps of care.  . When you arrive at healthcare provider, tell the healthcare staff immediately you have returned from visiting [place], [place], [place], [place] or [place]; or traveled in the [place] to [place], [place], [place], or [place]; in the last two weeks or you have been in close contact with a person diagnosed with COVID-19 in the last 2 weeks.   . Tell the health care staff about your symptoms: fever, cough and shortness of breath. . After you have been seen by a medical provider, you will be either: o Tested for (COVID-19) and discharged home on quarantine except to seek medical care if symptoms worsen, and asked to  - Stay home and avoid contact with others until you get your results (4-5 days)  - Avoid travel on public transportation if possible (such as bus, train, or airplane) or o Sent to the Emergency Department by EMS for evaluation, COVID-19 testing, and possible  admission depending on your condition and test results.  What to do if you are LOW RISK for COVID-19?  Reduce your risk of any infection by using the same precautions used for avoiding the common cold or flu:  . Wash your hands often with soap and warm water for at least 20 seconds.  If soap and water are not readily available, use an alcohol-based hand sanitizer with at least 60% alcohol.  . If coughing or sneezing, cover your mouth and nose by coughing or sneezing into the elbow areas of your shirt or coat, into a tissue or into your sleeve (not your hands). . Avoid shaking hands with others and consider head nods or verbal greetings only. . Avoid touching your eyes, nose, or mouth with unwashed hands.  . Avoid close contact with people who are Angelis Gates. . Avoid places or events with large numbers of people in one location, like concerts or sporting events. . Carefully consider travel plans you have or are making. . If you are planning any travel outside or inside the [place], visit the CDC's Travelers' Health webpage for the latest health notices. . If you have some symptoms but not all symptoms, continue to monitor at home and seek medical attention if your symptoms worsen. . If you are having a medical emergency, call 911.  05/05/19 SCREENING NEG SLS ADDITIONAL HEALTHCARE OPTIONS FOR PATIENTS  [facility] / e-Visit: [email or web address]         [facility]: [phone]    [facility]: [phone]                   [facility]: [phone]  

## 2019-05-06 ENCOUNTER — Other Ambulatory Visit: Payer: Self-pay

## 2019-05-06 ENCOUNTER — Ambulatory Visit (INDEPENDENT_AMBULATORY_CARE_PROVIDER_SITE_OTHER): Payer: Commercial Managed Care - PPO

## 2019-05-06 ENCOUNTER — Ambulatory Visit (INDEPENDENT_AMBULATORY_CARE_PROVIDER_SITE_OTHER): Payer: Commercial Managed Care - PPO | Admitting: Certified Nurse Midwife

## 2019-05-06 VITALS — BP 122/83 | HR 80 | Wt 192.4 lb

## 2019-05-06 DIAGNOSIS — O480 Post-term pregnancy: Secondary | ICD-10-CM | POA: Diagnosis not present

## 2019-05-06 DIAGNOSIS — Z3403 Encounter for supervision of normal first pregnancy, third trimester: Secondary | ICD-10-CM

## 2019-05-06 DIAGNOSIS — Z3493 Encounter for supervision of normal pregnancy, unspecified, third trimester: Secondary | ICD-10-CM

## 2019-05-06 NOTE — Progress Notes (Signed)
ROB and BPP today 8/8 . ( see below ) SVE per pt request 2-3 cm/80/-2 station, Membranes stripped. Induction scheduled for Thursday @0500  with Melody . Labor precautions reviewed.   Philip Aspen, CNM   Patient Name: Mackenzie Crawford DOB: [birth year] MRN: [id number]  ULTRASOUND REPORT  Location: [facility] Date of Service: 05/06/2019   Indications:growth/afi Findings:  Nelda Marseille intrauterine pregnancy is visualized with FHR at 151 BPM. Biometrics give an (U/S) Gestational age of [redacted]w[redacted]d and an (U/S) EDD of 05/12/2019); this correlates with the clinically established Estimated Date of Delivery: 05/03/19.  Fetal presentation is Cephalic.  Placenta: anterior. Grade: 2 AFI: 14.5 cm  Growth percentile is 66. EFW: 3756 g ( 8 lbs 5 oz    BPP Scoring: Movement: 2/2  Tone: 2/2  Breathing: 2/2  AFI: 2/2  Impression:  1. [redacted]w[redacted]d Viable Singleton Intrauterine pregnancy previously established criteria. 2. Growth is 66 %ile.  AFI is 14.5 cm.  3. BPP is8  Recommendations: 1.Clinical correlation with the patient's History and Physical Exam.   Jenine  M. Albertine Grates     RDMS

## 2019-05-06 NOTE — Patient Instructions (Signed)
Labor Induction  Labor induction is when steps are taken to cause a pregnant woman to begin the labor process. Most women go into labor on their own between 37 weeks and 42 weeks of pregnancy. When this does not happen or when there is a medical need for labor to begin, steps may be taken to induce labor. Labor induction causes a pregnant woman's uterus to contract. It also causes the cervix to soften (ripen), open (dilate), and thin out (efface). Usually, labor is not induced before 39 weeks of pregnancy unless there is a medical reason to do so. Your health care provider will determine if labor induction is needed. Before inducing labor, your health care provider will consider a number of factors, including:  Your medical condition and your baby's.  How many weeks along you are in your pregnancy.  How mature your baby's lungs are.  The condition of your cervix.  The position of your baby.  The size of your birth canal. What are some reasons for labor induction? Labor may be induced if:  Your health or your baby's health is at risk.  Your pregnancy is overdue by 1 week or more.  Your water breaks but labor does not start on its own.  There is a low amount of amniotic fluid around your baby. You may also choose (elect) to have labor induced at a certain time. Generally, elective labor induction is done no earlier than 39 weeks of pregnancy. What methods are used for labor induction? Methods used for labor induction include:  Prostaglandin medicine. This medicine starts contractions and causes the cervix to dilate and ripen. It can be taken by mouth (orally) or by being inserted into the vagina (suppository).  Inserting a small, thin tube (catheter) with a balloon into the vagina and then expanding the balloon with water to dilate the cervix.  Stripping the membranes. In this method, your health care provider gently separates amniotic sac tissue from the cervix. This causes the  cervix to stretch, which in turn causes the release of a hormone called progesterone. The hormone causes the uterus to contract. This procedure is often done during an office visit, after which you will be sent home to wait for contractions to begin.  Breaking the water. In this method, your health care provider uses a small instrument to make a small hole in the amniotic sac. This eventually causes the amniotic sac to break. Contractions should begin after a few hours.  Medicine to trigger or strengthen contractions. This medicine is given through an IV that is inserted into a vein in your arm. Except for membrane stripping, which can be done in a clinic, labor induction is done in the hospital so that you and your baby can be carefully monitored. How long does it take for labor to be induced? The length of time it takes to induce labor depends on how ready your body is for labor. Some inductions can take up to 2-3 days, while others may take less than a day. Induction may take longer if:  You are induced early in your pregnancy.  It is your first pregnancy.  Your cervix is not ready. What are some risks associated with labor induction? Some risks associated with labor induction include:  Changes in fetal heart rate, such as being too high, too low, or irregular (erratic).  Failed induction.  Infection in the mother or the baby.  Increased risk of having a cesarean delivery.  Fetal death.  Breaking off (abruption)   of the placenta from the uterus (rare).  Rupture of the uterus (very rare). When induction is needed for medical reasons, the benefits of induction generally outweigh the risks. What are some reasons for not inducing labor? Labor induction should not be done if:  Your baby does not tolerate contractions.  You have had previous surgeries on your uterus, such as a myomectomy, removal of fibroids, or a vertical scar from a previous cesarean delivery.  Your placenta lies  very low in your uterus and blocks the opening of the cervix (placenta previa).  Your baby is not in a head-down position.  The umbilical cord drops down into the birth canal in front of the baby.  There are unusual circumstances, such as the baby being very early (premature).  You have had more than 2 previous cesarean deliveries. Summary  Labor induction is when steps are taken to cause a pregnant woman to begin the labor process.  Labor induction causes a pregnant woman's uterus to contract. It also causes the cervix to ripen, dilate, and efface.  Labor is not induced before 39 weeks of pregnancy unless there is a medical reason to do so.  When induction is needed for medical reasons, the benefits of induction generally outweigh the risks. This information is not intended to replace advice given to you by your health care provider. Make sure you discuss any questions you have with your health care provider. Document Released: 02/28/2007 Document Revised: 10/12/2017 Document Reviewed: 11/22/2016 Elsevier Patient Education  2020 [employer] of the uterus can occur throughout pregnancy, but they are not always a sign that you are in labor. You may have practice contractions called Braxton Hicks contractions. These false labor contractions are sometimes confused with true labor. What are [place] contractions? Braxton Hicks contractions are tightening movements that occur in the muscles of the uterus before labor. Unlike true labor contractions, these contractions do not result in opening (dilation) and thinning of the cervix. Toward the end of pregnancy (32-34 weeks), Braxton Hicks contractions can happen more often and may become stronger. These contractions are sometimes difficult to tell apart from true labor because they can be very uncomfortable. You should not feel embarrassed if you go to the hospital with false labor. Sometimes, the  only way to tell if you are in true labor is for your health care provider to look for changes in the cervix. The health care provider will do a physical exam and may monitor your contractions. If you are not in true labor, the exam should show that your cervix is not dilating and your water has not broken. If there are no other health problems associated with your pregnancy, it is completely safe for you to be sent home with false labor. You may continue to have Braxton Hicks contractions until you go into true labor. How to tell the difference between true labor and false labor True labor  Contractions last 30-70 seconds.  Contractions become very regular.  Discomfort is usually felt in the top of the uterus, and it spreads to the lower abdomen and low back.  Contractions do not go away with walking.  Contractions usually become more intense and increase in frequency.  The cervix dilates and gets thinner. False labor  Contractions are usually shorter and not as strong as true labor contractions.  Contractions are usually irregular.  Contractions are often felt in the front of the lower abdomen and in the groin.  Contractions may go  away when you walk around or change positions while lying down.  Contractions get weaker and are shorter-lasting as time goes on.  The cervix usually does not dilate or become thin. Follow these instructions at home:   Take over-the-counter and prescription medicines only as told by your health care provider.  Keep up with your usual exercises and follow other instructions from your health care provider.  Eat and drink lightly if you think you are going into labor.  If Braxton Hicks contractions are making you uncomfortable: ? Change your position from lying down or resting to walking, or change from walking to resting. ? Sit and rest in a tub of warm water. ? Drink enough fluid to keep your urine pale yellow. Dehydration may cause these  contractions. ? Do slow and deep breathing several times an hour.  Keep all follow-up prenatal visits as told by your health care provider. This is important. Contact a health care provider if:  You have a fever.  You have continuous pain in your abdomen. Get help right away if:  Your contractions become stronger, more regular, and closer together.  You have fluid leaking or gushing from your vagina.  You pass blood-tinged mucus (bloody show).  You have bleeding from your vagina.  You have low back pain that you never had before.  You feel your baby's head pushing down and causing pelvic pressure.  Your baby is not moving inside you as much as it used to. Summary  Contractions that occur before labor are called Braxton Hicks contractions, false labor, or practice contractions.  Braxton Hicks contractions are usually shorter, weaker, farther apart, and less regular than true labor contractions. True labor contractions usually become progressively stronger and regular, and they become more frequent.  Manage discomfort from [facility] contractions by changing position, resting in a warm bath, drinking plenty of water, or practicing deep breathing. This information is not intended to replace advice given to you by your health care provider. Make sure you discuss any questions you have with your health care provider. Document Released: 02/22/2017 Document Revised: 09/21/2017 Document Reviewed: 02/22/2017 Elsevier Patient Education  2020 [employer].

## 2019-05-09 ENCOUNTER — Inpatient Hospital Stay: Payer: Commercial Managed Care - PPO | Admitting: Registered Nurse

## 2019-05-09 ENCOUNTER — Inpatient Hospital Stay
Admission: EM | Admit: 2019-05-09 | Discharge: 2019-05-10 | DRG: 807 | Disposition: A | Discharge status: Home / Self Care | Payer: Commercial Managed Care - PPO | Attending: Certified Nurse Midwife | Admitting: Certified Nurse Midwife

## 2019-05-09 ENCOUNTER — Other Ambulatory Visit: Payer: Self-pay

## 2019-05-09 DIAGNOSIS — Z3A40 40 weeks gestation of pregnancy: Secondary | ICD-10-CM

## 2019-05-09 DIAGNOSIS — Z1159 Encounter for screening for other viral diseases: Secondary | ICD-10-CM | POA: Diagnosis not present

## 2019-05-09 DIAGNOSIS — O480 Post-term pregnancy: Secondary | ICD-10-CM | POA: Diagnosis present

## 2019-05-09 DIAGNOSIS — Z87891 Personal history of nicotine dependence: Secondary | ICD-10-CM

## 2019-05-09 LAB — BPAM RBC
Blood Product Expiration Date: 202008052359
Blood Product Expiration Date: 202008112359
Unit Type and Rh: 1700
Unit Type and Rh: 9500

## 2019-05-09 LAB — CBC
HCT: 31.6 % — ABNORMAL LOW (ref 36.0–46.0)
Hemoglobin: 10.5 g/dL — ABNORMAL LOW (ref 12.0–15.0)
MCH: 27.9 pg (ref 26.0–34.0)
MCHC: 33.2 g/dL (ref 30.0–36.0)
MCV: 83.8 fL (ref 80.0–100.0)
Platelets: 196 10*3/uL (ref 150–400)
RBC: 3.77 MIL/uL — ABNORMAL LOW (ref 3.87–5.11)
RDW: 13.1 % (ref 11.5–15.5)
WBC: 9.3 10*3/uL (ref 4.0–10.5)
nRBC: 0 % (ref 0.0–0.2)

## 2019-05-09 LAB — TYPE AND SCREEN
ABO/RH(D): B NEG
Antibody Screen: POSITIVE
Unit division: 0
Unit division: 0

## 2019-05-09 LAB — SARS CORONAVIRUS 2 BY RT PCR (HOSPITAL ORDER, PERFORMED IN ~~LOC~~ HOSPITAL LAB): SARS Coronavirus 2: NEGATIVE

## 2019-05-09 LAB — ABO/RH: ABO/RH(D): B NEG

## 2019-05-09 MED ORDER — DOCUSATE SODIUM 100 MG PO CAPS
100.0000 mg | ORAL_CAPSULE | Freq: Two times a day (BID) | ORAL | Status: DC
  Administered 2019-05-09: 23:00:00 100 mg via ORAL
  Filled 2019-05-09 (×2): qty 1

## 2019-05-09 MED ORDER — METHYLERGONOVINE MALEATE 0.2 MG PO TABS: 0.2000 mg | ORAL_TABLET | ORAL | Status: DC | PRN

## 2019-05-09 MED ORDER — LACTATED RINGERS IV SOLN
500.0000 mL | INTRAVENOUS | Status: DC | PRN
  Administered 2019-05-09: 500 mL via INTRAVENOUS

## 2019-05-09 MED ORDER — SOD CITRATE-CITRIC ACID 500-334 MG/5ML PO SOLN: 30.0000 mL | ORAL | Status: DC | PRN

## 2019-05-09 MED ORDER — PRENATAL MULTIVITAMIN CH
1.0000 | ORAL_TABLET | Freq: Every day | ORAL | Status: DC
  Administered 2019-05-10: 1 via ORAL
  Filled 2019-05-09: qty 1

## 2019-05-09 MED ORDER — LIDOCAINE HCL (PF) 1 % IJ SOLN
INTRAMUSCULAR | Status: AC
  Filled 2019-05-09: qty 30

## 2019-05-09 MED ORDER — DIBUCAINE (PERIANAL) 1 % EX OINT: 1.0000 "application " | TOPICAL_OINTMENT | CUTANEOUS | Status: DC | PRN

## 2019-05-09 MED ORDER — BENZOCAINE-MENTHOL 20-0.5 % EX AERO: 1.0000 "application " | INHALATION_SPRAY | CUTANEOUS | Status: DC | PRN

## 2019-05-09 MED ORDER — ONDANSETRON HCL 4 MG/2ML IJ SOLN: 4.0000 mg | Freq: Four times a day (QID) | INTRAMUSCULAR | Status: DC | PRN

## 2019-05-09 MED ORDER — TETANUS-DIPHTH-ACELL PERTUSSIS 5-2.5-18.5 LF-MCG/0.5 IM SUSP: 0.5000 mL | Freq: Once | INTRAMUSCULAR | Status: DC

## 2019-05-09 MED ORDER — COCONUT OIL OIL
1.0000 "application " | TOPICAL_OIL | Status: DC | PRN
  Administered 2019-05-10: 1 via TOPICAL
  Filled 2019-05-09: qty 120

## 2019-05-09 MED ORDER — LIDOCAINE HCL (PF) 1 % IJ SOLN: 30.0000 mL | INTRAMUSCULAR | Status: DC | PRN

## 2019-05-09 MED ORDER — BUPIVACAINE HCL (PF) 0.25 % IJ SOLN
INTRAMUSCULAR | Status: DC | PRN
  Administered 2019-05-09 (×2): 4 mL via EPIDURAL

## 2019-05-09 MED ORDER — METHYLERGONOVINE MALEATE 0.2 MG/ML IJ SOLN: 0.2000 mg | INTRAMUSCULAR | Status: DC | PRN

## 2019-05-09 MED ORDER — OXYTOCIN 40 UNITS IN NORMAL SALINE INFUSION - SIMPLE MED
1.0000 m[IU]/min | INTRAVENOUS | Status: DC
  Administered 2019-05-09: 02:00:00 2 m[IU]/min via INTRAVENOUS

## 2019-05-09 MED ORDER — CALCIUM CARBONATE ANTACID 500 MG PO CHEW
CHEWABLE_TABLET | ORAL | Status: AC
  Filled 2019-05-09: qty 1

## 2019-05-09 MED ORDER — SIMETHICONE 80 MG PO CHEW: 80.0000 mg | CHEWABLE_TABLET | ORAL | Status: DC | PRN

## 2019-05-09 MED ORDER — SENNOSIDES-DOCUSATE SODIUM 8.6-50 MG PO TABS
2.0000 | ORAL_TABLET | ORAL | Status: DC
  Administered 2019-05-09: 2 via ORAL
  Filled 2019-05-09: qty 2

## 2019-05-09 MED ORDER — OXYTOCIN BOLUS FROM INFUSION
500.0000 mL | Freq: Once | INTRAVENOUS | Status: AC
  Administered 2019-05-09: 500 mL via INTRAVENOUS

## 2019-05-09 MED ORDER — OXYCODONE-ACETAMINOPHEN 5-325 MG PO TABS: 2.0000 | ORAL_TABLET | ORAL | Status: DC | PRN

## 2019-05-09 MED ORDER — CALCIUM CARBONATE ANTACID 500 MG PO CHEW
1.0000 | CHEWABLE_TABLET | Freq: Once | ORAL | Status: AC | PRN
  Administered 2019-05-09: 11:00:00 200 mg via ORAL

## 2019-05-09 MED ORDER — MISOPROSTOL 200 MCG PO TABS
ORAL_TABLET | ORAL | Status: AC
  Filled 2019-05-09: qty 4

## 2019-05-09 MED ORDER — FENTANYL 2.5 MCG/ML W/ROPIVACAINE 0.15% IN NS 100 ML EPIDURAL (ARMC)
EPIDURAL | Status: DC | PRN
  Administered 2019-05-09: 12 mL/h via EPIDURAL

## 2019-05-09 MED ORDER — DIPHENHYDRAMINE HCL 25 MG PO CAPS: 25.0000 mg | ORAL_CAPSULE | Freq: Four times a day (QID) | ORAL | Status: DC | PRN

## 2019-05-09 MED ORDER — LACTATED RINGERS IV SOLN
INTRAVENOUS | Status: DC
  Administered 2019-05-09 (×2): via INTRAVENOUS

## 2019-05-09 MED ORDER — AMMONIA AROMATIC IN INHA
RESPIRATORY_TRACT | Status: AC
  Filled 2019-05-09: qty 10

## 2019-05-09 MED ORDER — ONDANSETRON HCL 4 MG PO TABS: 4.0000 mg | ORAL_TABLET | ORAL | Status: DC | PRN

## 2019-05-09 MED ORDER — ONDANSETRON HCL 4 MG/2ML IJ SOLN: 4.0000 mg | INTRAMUSCULAR | Status: DC | PRN

## 2019-05-09 MED ORDER — TERBUTALINE SULFATE 1 MG/ML IJ SOLN: 0.2500 mg | Freq: Once | INTRAMUSCULAR | Status: DC | PRN

## 2019-05-09 MED ORDER — LIDOCAINE HCL (PF) 1 % IJ SOLN
INTRAMUSCULAR | Status: DC | PRN
  Administered 2019-05-09: 3 mL

## 2019-05-09 MED ORDER — FERROUS SULFATE 325 (65 FE) MG PO TABS
325.0000 mg | ORAL_TABLET | Freq: Every day | ORAL | Status: DC
  Administered 2019-05-10: 09:00:00 325 mg via ORAL
  Filled 2019-05-09: qty 1

## 2019-05-09 MED ORDER — WITCH HAZEL-GLYCERIN EX PADS
1.0000 "application " | MEDICATED_PAD | CUTANEOUS | Status: DC | PRN
  Administered 2019-05-10: 1 via TOPICAL
  Filled 2019-05-09: qty 100

## 2019-05-09 MED ORDER — LIDOCAINE-EPINEPHRINE (PF) 1.5 %-1:200000 IJ SOLN
INTRAMUSCULAR | Status: DC | PRN
  Administered 2019-05-09: 3 mL via PERINEURAL

## 2019-05-09 MED ORDER — ACETAMINOPHEN 325 MG PO TABS: 650.0000 mg | ORAL_TABLET | ORAL | Status: DC | PRN

## 2019-05-09 MED ORDER — OXYTOCIN 10 UNIT/ML IJ SOLN
INTRAMUSCULAR | Status: AC
  Filled 2019-05-09: qty 2

## 2019-05-09 MED ORDER — OXYTOCIN 40 UNITS IN NORMAL SALINE INFUSION - SIMPLE MED
2.5000 [IU]/h | INTRAVENOUS | Status: DC
  Administered 2019-05-09: 2.5 [IU]/h via INTRAVENOUS
  Filled 2019-05-09: qty 1000

## 2019-05-09 MED ORDER — BUTORPHANOL TARTRATE 2 MG/ML IJ SOLN
1.0000 mg | INTRAMUSCULAR | Status: DC | PRN
  Administered 2019-05-09: 05:00:00 1 mg via INTRAVENOUS
  Filled 2019-05-09: qty 1

## 2019-05-09 MED ORDER — IBUPROFEN 600 MG PO TABS
600.0000 mg | ORAL_TABLET | Freq: Four times a day (QID) | ORAL | Status: DC
  Administered 2019-05-09 – 2019-05-10 (×4): 600 mg via ORAL
  Filled 2019-05-09 (×4): qty 1

## 2019-05-09 MED ORDER — MISOPROSTOL 50MCG HALF TABLET: 50.0000 ug | ORAL_TABLET | ORAL | Status: DC | PRN

## 2019-05-09 MED ORDER — ACETAMINOPHEN 325 MG PO TABS
650.0000 mg | ORAL_TABLET | ORAL | Status: DC | PRN
  Administered 2019-05-09 – 2019-05-10 (×2): 650 mg via ORAL
  Filled 2019-05-09 (×2): qty 2

## 2019-05-09 MED ORDER — FENTANYL 2.5 MCG/ML W/ROPIVACAINE 0.15% IN NS 100 ML EPIDURAL (ARMC)
EPIDURAL | Status: AC
  Filled 2019-05-09: qty 100

## 2019-05-09 MED ORDER — OXYCODONE-ACETAMINOPHEN 5-325 MG PO TABS: 1.0000 | ORAL_TABLET | ORAL | Status: DC | PRN

## 2019-05-09 NOTE — Lactation Note (Signed)
This note was copied from a baby's chart. Lactation Consultation Note  Patient Name: Boy Ghina Bittinger [id number] Date: 05/09/2019 Reason for consult: Initial assessment;Primapara   Maternal Data Formula Feeding for Exclusion: No Does the patient have breastfeeding experience prior to this delivery?: No States baby latched well and nursed 20 min on left breast Feeding Feeding Type: Breast Fed Could latch but fussy at breast, pulls off frequently, mom desires to eat lunch and take a break LATCH Score Latch: Repeated attempts needed to sustain latch, nipple held in mouth throughout feeding, stimulation needed to elicit sucking reflex.  Audible Swallowing: None  Type of Nipple: Everted at rest and after stimulation  Comfort (Breast/Nipple): Soft / non-tender  Hold (Positioning): Assistance needed to correctly position infant at breast and maintain latch.  LATCH Score: 6  Interventions Interventions: Breast feeding basics reviewed;Assisted with latch;Skin to skin;Breast compression;Adjust position  Lactation Tools Discussed/Used WIC Program: No   Consult Status Consult Status: Follow-up Date: 05/09/19 Follow-up type: In-patient    Ferol Luz 05/09/2019, 1:49 PM

## 2019-05-09 NOTE — Anesthesia Procedure Notes (Signed)
Epidural Patient location during procedure: OB Start time: 05/09/2019 7:31 AM End time: 05/09/2019 7:41 AM  Staffing Anesthesiologist: Emmie Niemann, MD Resident/CRNA: Doreen Salvage, CRNA Performed: resident/CRNA   Preanesthetic Checklist Completed: patient identified, site marked, surgical consent, pre-op evaluation, timeout performed, IV checked, risks and benefits discussed and monitors and equipment checked  Epidural Patient position: sitting Prep: ChloraPrep Patient monitoring: heart rate, continuous pulse ox and blood pressure Approach: midline Location: L3-L4 Injection technique: LOR saline  Needle:  Needle type: Tuohy  Needle gauge: 17 G Needle length: 9 cm and 9 Needle insertion depth: 7 cm Catheter type: closed end flexible Catheter size: 19 Gauge Catheter at skin depth: 12 cm Test dose: negative and 1.5% lidocaine with Epi 1:200 K  Assessment Sensory level: T10 Events: blood not aspirated, injection not painful, no injection resistance, negative IV test and no paresthesia  Additional Notes 2 attempt Pt. Evaluated and documentation done after procedure finished. Patient identified. Risks/Benefits/Options discussed with patient including but not limited to bleeding, infection, nerve damage, paralysis, failed block, incomplete pain control, headache, blood pressure changes, nausea, vomiting, reactions to medication both or allergic, itching and postpartum back pain. Confirmed with bedside nurse the patient's most recent platelet count. Confirmed with patient that they are not currently taking any anticoagulation, have any bleeding history or any family history of bleeding disorders. Patient expressed understanding and wished to proceed. All questions were answered. Sterile technique was used throughout the entire procedure. Please see nursing notes for vital signs. Test dose was given through epidural catheter and negative prior to continuing to dose epidural or start  infusion. Warning signs of high block given to the patient including shortness of breath, tingling/numbness in hands, complete motor block, or any concerning symptoms with instructions to call for help. Patient was given instructions on fall risk and not to get out of bed. All questions and concerns addressed with instructions to call with any issues or inadequate analgesia.   Patient tolerated the insertion well without immediate complications.Reason for block:procedure for pain

## 2019-05-09 NOTE — H&P (Signed)
Obstetric History and Physical  Mackenzie Crawford is a 33 y.o. G1P0 with IUP at [redacted]w[redacted]d presenting with order for IOL due to postdates. Patient states she has been having  irregular, every 6-7 minutes contractions, none vaginal bleeding, intact membranes, with active fetal movement.    Prenatal Course Source [facility] Care: [facility]  Pregnancy complications or risks:none  Prenatal labs and studies: ABO, Rh: --/--/PENDING (07/17 6761) Antibody: POS (07/17 0128) Rubella: Immune (11/19 0000) RPR: Non Reactive (04/24 0952)  HBsAg: Negative (11/19 0000)  HIV: Non Reactive (11/19 0000)  PJK:DTOIZTIW (06/15 1603) 1 hr Glucola  normal Genetic screening normal Anatomy US normal  Past Medical History:  Diagnosis Date  . Anxiety   . 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 Kidney stones   . Pelvic pain   . Recurrent streptococcal tonsillitis     Past Surgical History:  Procedure Laterality Date  . TONSILLECTOMY AND ADENOIDECTOMY  2015  . WISDOM TOOTH EXTRACTION      OB History  Gravida Para Term Preterm AB Living  1            SAB TAB Ectopic Multiple Live Births               # Outcome Date GA Lbr Len/2nd Weight Sex Delivery Anes PTL Lv  1 Current             Social History   Socioeconomic History  . Marital status: Married    Spouse name: Not on file  . Number of children: Not on file  . Years of education: Not on file  . Highest education level: Not on file  Occupational History  . Not on file  Social Needs  . Financial resource strain: Not on file  . Food insecurity    Worry: Not on file    Inability: Not on file  . Transportation needs    Medical: Not on file    Non-medical: Not on file  Tobacco Use  . Smoking status: Former Smoker    Quit date: 11/01/2009    Years since quitting: 9.5  . Smokeless tobacco: Never Used  Substance and Sexual Activity  . Alcohol use: Yes    Alcohol/week: 2.0 standard drinks    Types: 2 drink(s) per week  . Drug use: Yes  . Sexual  activity: Not Currently  Lifestyle  . Physical activity    Days per week: Not on file    Minutes per session: Not on file  . Stress: Not on file  Relationships  . Social Herbalist on phone: Not on file    Gets together: Not on file    Attends religious service: Not on file    Active member of club or organization: Not on file    Attends meetings of clubs or organizations: Not on file    Relationship status: Not on file  Other Topics Concern  . Not on file  Social History Narrative   Lives in [place].   From [place].      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 Kidney Stones Father   . Kidney Stones Brother   . Cancer Maternal Grandmother        pancreatic  . Heart disease Maternal Grandfather 16  .  Arthritis Paternal Grandmother   . Diabetes Paternal Grandmother     Medications Prior to Admission  Medication Sig Dispense Refill Last Dose  . Prenat-FeFmCb-DSS-FA-DHA w/o A (CITRANATAL HARMONY) 27-1-260 MG CAPS Take by mouth daily.   05/08/2019 at Unknown time    No Known Allergies  Review of Systems: Negative except for what is mentioned in HPI.  Physical Exam: BP 139/89 (BP Location: Right Arm)   Pulse 93   Temp 98.1 F (36.7 C) (Oral)   Resp 20   Ht 5\' 5"  (1.651 m)   Wt 87.1 kg   LMP 07/27/2018   BMI 31.95 kg/m  GENERAL: Well-developed, well-nourished female in no acute distress.  LUNGS: Clear to auscultation bilaterally.  HEART: Regular rate and rhythm. ABDOMEN: Soft, nontender, nondistended, gravid. EXTREMITIES: Nontender, no edema, 2+ distal pulses. Cervical Exam: Dilation: 4 Effacement (%): 60 Station: -2 Presentation: Vertex Exam by:: A. White,  RN FHT:  Baseline rate 144 bpm   Variability moderate  Accelerations present   Decelerations none Contractions: Every 2-3 mins on 10 mu/min  pitocin   Pertinent Labs/Studies:   Results for orders placed or performed during the hospital encounter of 05/09/19 (from the past 24 hour(s))  CBC     Status: Abnormal   Collection Time: 05/09/19  1:28 AM  Result Value Ref Range   WBC 9.3 4.0 - 10.5 K/uL   RBC 3.77 (L) 3.87 - 5.11 MIL/uL   Hemoglobin 10.5 (L) 12.0 - 15.0 g/dL   HCT 31.6 (L) 36.0 - 46.0 %   MCV 83.8 80.0 - 100.0 fL   MCH 27.9 26.0 - 34.0 pg   MCHC 33.2 30.0 - 36.0 g/dL   RDW 13.1 11.5 - 15.5 %   Platelets 196 150 - 400 K/uL   nRBC 0.0 0.0 - 0.2 %  SARS Coronavirus 2 (CEPHEID - Performed in [place] hospital lab), Hosp Order     Status: None   Collection Time: 05/09/19  1:28 AM   Specimen: Nasopharyngeal Swab  Result Value Ref Range   SARS Coronavirus 2 NEGATIVE NEGATIVE  Type and screen     Status: None (Preliminary result)   Collection Time: 05/09/19  1:28 AM  Result Value Ref Range   ABO/RH(D) B NEG    Antibody Screen POS    Sample Expiration 05/12/2019,2359    Antibody Identification PASSIVELY ACQUIRED ANTI-D    Unit Number [id number]    Blood Component Type RED CELLS,LR    Unit division 00    Status of Unit ALLOCATED    Transfusion Status OK TO TRANSFUSE    Crossmatch Result      COMPATIBLE Performed at [facility], [address], [place], [place] [address]    Unit Number [id number]    Blood Component Type RED CELLS,LR    Unit division 00    Status of Unit ALLOCATED    Transfusion Status OK TO TRANSFUSE    Crossmatch Result COMPATIBLE   ABO/Rh     Status: None (Preliminary result)   Collection Time: 05/09/19  6:43 AM  Result Value Ref Range   ABO/RH(D) PENDING     Assessment : Mackenzie Crawford is a 33 y.o. G1P0 at [redacted]w[redacted]d being admitted for induction of labor.  Plan: Labor: Expectant management.  Induction/Augmentation as needed, per protocol FWB: Reassuring fetal heart tracing.  GBS negative Delivery plan: Hopeful for vaginal delivery  [name], CNM Deneise Lever  Thompson,CNM will be assuming care this am. [facility], [facility]

## 2019-05-09 NOTE — Anesthesia Preprocedure Evaluation (Addendum)
Anesthesia Evaluation  Patient identified by MRN, date of birth, ID band Patient awake    Reviewed: Allergy & Precautions, H&P , NPO status , Patient's Chart, lab work & pertinent test results  Airway Mallampati: II  TM Distance: >3 FB Neck ROM: full    Dental no notable dental hx.    Pulmonary former smoker,    Pulmonary exam normal        Cardiovascular negative cardio ROS Normal cardiovascular exam     Neuro/Psych Anxiety negative neurological ROS     GI/Hepatic negative GI ROS, Neg liver ROS,   Endo/Other  negative endocrine ROS  Renal/GU Renal diseaseKidney Stones  negative genitourinary   Musculoskeletal   Abdominal   Peds  Hematology negative hematology ROS (+)   Anesthesia Other Findings   Reproductive/Obstetrics (+) Pregnancy                            Anesthesia Physical Anesthesia Plan  ASA: II  Anesthesia Plan: Epidural   Post-op Pain Management:    Induction:   PONV Risk Score and Plan:   Airway Management Planned:   Additional Equipment:   Intra-op Plan:   Post-operative Plan:   Informed Consent: I have reviewed the patients History and Physical, chart, labs and discussed the procedure including the risks, benefits and alternatives for the proposed anesthesia with the patient or authorized representative who has indicated his/her understanding and acceptance.       Plan Discussed with: Anesthesiologist  Anesthesia Plan Comments:         Anesthesia Quick Evaluation

## 2019-05-09 NOTE — Progress Notes (Signed)
Epidural infusion stopped at 1210.

## 2019-05-09 NOTE — Plan of Care (Signed)
Pt presents to L&D for IOL for post dates. Pt labored well and had successful vaginal delivery.Pain controlled and pt verbalized understanding about the POC. Mother and infant to Mother Baby for care.

## 2019-05-10 ENCOUNTER — Encounter: Payer: Self-pay | Admitting: Obstetrics and Gynecology

## 2019-05-10 LAB — CBC
HCT: 27.6 % — ABNORMAL LOW (ref 36.0–46.0)
Hemoglobin: 8.9 g/dL — ABNORMAL LOW (ref 12.0–15.0)
MCH: 27.7 pg (ref 26.0–34.0)
MCHC: 32.2 g/dL (ref 30.0–36.0)
MCV: 86 fL (ref 80.0–100.0)
Platelets: 155 10*3/uL (ref 150–400)
RBC: 3.21 MIL/uL — ABNORMAL LOW (ref 3.87–5.11)
RDW: 13.3 % (ref 11.5–15.5)
WBC: 11.5 10*3/uL — ABNORMAL HIGH (ref 4.0–10.5)
nRBC: 0 % (ref 0.0–0.2)

## 2019-05-10 LAB — RPR: RPR Ser Ql: NONREACTIVE

## 2019-05-10 MED ORDER — WITCH HAZEL-GLYCERIN EX PADS: 1.0000 "application " | MEDICATED_PAD | CUTANEOUS | 12 refills | Status: DC | PRN

## 2019-05-10 MED ORDER — IBUPROFEN 600 MG PO TABS: 600.0000 mg | ORAL_TABLET | Freq: Four times a day (QID) | ORAL | 0 refills | Status: DC

## 2019-05-10 NOTE — Final Progress Note (Signed)
                     Discharge Day SOAP Note:  Progress Note - Vaginal Delivery  Mackenzie Crawford is a 33 y.o. G1P1001 now PP day 1 s/p Vaginal, Spontaneous . Delivery was uncomplicated  Subjective  The patient has the following complaints: has no unusual complaints  Pain is controlled with current medications.   Patient is urinating without difficulty.  She is ambulating well.     Objective  Vital signs: BP 109/69 (BP Location: Left Arm)   Pulse 71   Temp 98.2 F (36.8 C) (Oral)   Resp 18   Ht 5\' 5"  (1.651 m)   Wt 87.1 kg   LMP 07/27/2018   SpO2 99%   Breastfeeding Unknown   BMI 31.95 kg/m   Physical Exam: Gen: NAD Fundus Fundal Tone: Firm @ U-1  Lochia Amount: Scant  Perineum Appearance: Approximated     Data Review Labs: CBC Latest Ref Rng & Units 05/10/2019 05/09/2019 02/14/2019  WBC 4.0 - 10.5 K/uL 11.5(H) 9.3 9.0  Hemoglobin 12.0 - 15.0 g/dL 8.9(L) 10.5(L) 11.0(L)  Hematocrit 36.0 - 46.0 % 27.6(L) 31.6(L) 32.7(L)  Platelets 150 - 400 K/uL 155 196 206   B NEG Performed at [facility], [address] [place], [place] [address]   Assessment/Plan  Active Problems:   Labor and delivery, indication for care    Plan for discharge today.   Discharge Instructions: Per After Visit Summary. Activity: Advance as tolerated. Pelvic rest for 6 weeks.  Also refer to After Visit Summary Diet: Regular Medications: Allergies as of 05/10/2019   No Known Allergies     Medication List    TAKE these medications   CitraNatal [place]ony 27-1-260 MG Caps Take by mouth daily.   ibuprofen 600 MG tablet Commonly known as: ADVIL Take 1 tablet (600 mg total) by mouth every 6 (six) hours.   witch hazel-glycerin pad Commonly known as: TUCKS Apply 1 application topically as needed for hemorrhoids.      Outpatient follow up: 6 wks Postpartum contraception: Plans condoms and NFP  Discharged Condition: good  Discharged to: home  Newborn  Data: Disposition:home with mother  Apgars: APGAR (1 MIN): 9   APGAR (5 MINS): 9   APGAR (10 MINS):    Baby Feeding: Breast    Philip Aspen, CNM  05/10/2019 11:14 AM

## 2019-05-10 NOTE — Discharge Summary (Signed)
Discharge Summary  Date of Admission: 05/09/2019  Date of Discharge: 05/10/2019  Admitting Diagnosis: Induction of labor  at [redacted]w[redacted]d  Mode of Delivery: normal spontaneous vaginal delivery                 Discharge Diagnosis: No other diagnosis   Intrapartum Procedures: epidural and laceration 1st   Post partum procedures: Rhogam not indicated baby B neg.   Complications: 1st degree perineal laceration                      Discharge Day SOAP Note:  Progress Note - Vaginal Delivery  Mackenzie Crawford is a 33 y.o. G1P1001 now PP day 1 s/p Vaginal, Spontaneous . Delivery was uncomplicated  Subjective  The patient has the following complaints: has no unusual complaints  Pain is controlled with current medications.   Patient is urinating without difficulty.  She is ambulating well.     Objective  Vital signs: BP 109/69 (BP Location: Left Arm)   Pulse 71   Temp 98.2 F (36.8 C) (Oral)   Resp 18   Ht 5\' 5"  (1.651 m)   Wt 87.1 kg   LMP 07/27/2018   SpO2 99%   Breastfeeding Unknown   BMI 31.95 kg/m   Physical Exam: Gen: NAD Fundus Fundal Tone: Firm @ U-1  Lochia Amount: Scant  Perineum Appearance: Approximated     Data Review Labs: CBC Latest Ref Rng & Units 05/10/2019 05/09/2019 02/14/2019  WBC 4.0 - 10.5 K/uL 11.5(H) 9.3 9.0  Hemoglobin 12.0 - 15.0 g/dL 8.9(L) 10.5(L) 11.0(L)  Hematocrit 36.0 - 46.0 % 27.6(L) 31.6(L) 32.7(L)  Platelets 150 - 400 K/uL 155 196 206   B NEG Performed at [facility], [address] [place], [place] [address]   Assessment/Plan  Active Problems:   Labor and delivery, indication for care    Plan for discharge today.   Discharge Instructions: Per After Visit Summary. Activity: Advance as tolerated. Pelvic rest for 6 weeks.  Also refer to After Visit Summary Diet: Regular Medications: Allergies as of 05/10/2019   No Known Allergies     Medication List    TAKE these medications   CitraNatal  Harmony 27-1-260 MG Caps Take by mouth daily.   ibuprofen 600 MG tablet Commonly known as: ADVIL Take 1 tablet (600 mg total) by mouth every 6 (six) hours.   witch hazel-glycerin pad Commonly known as: TUCKS Apply 1 application topically as needed for hemorrhoids.      Outpatient follow up: 6 wks Postpartum contraception: Plans condoms and NFP  Discharged Condition: good  Discharged to: home  Newborn Data: Disposition:home with mother  Apgars: APGAR (1 MIN): 9   APGAR (5 MINS): 9   APGAR (10 MINS):    Baby Feeding: Breast    Philip Aspen, CNM  05/10/2019 11:14 AM

## 2019-05-10 NOTE — Discharge Instructions (Signed)
Call your doctor for increased pain or vaginal bleeding temperature above 100.4, depression, or concerns.  Increase calories and fluids while breastfeeding.  Continue prenatal vitamin and iron.  No strenuous activity or heavy lifting for 6 weeks.  No intercourse, tampons, or douching for 6 weeks. No tub baths- showers only.  No driving for 2 weeks.

## 2019-05-10 NOTE — Progress Notes (Signed)
Discharge instructions provided.  Pt and sig other verbalize understanding of all instructions and follow-up care.  Pt discharged to home with infant at 1604 on 05/10/19 via wheelchair by NT. Reed Breech, RN 05/10/2019

## 2019-05-12 NOTE — Anesthesia Postprocedure Evaluation (Signed)
Anesthesia Post Note  Patient: Mackenzie Crawford  Procedure(s) Performed: AN AD [place]  Patient location during evaluation: Mother Baby Anesthesia Type: Epidural Level of consciousness: awake and alert and oriented Pain management: pain level controlled Vital Signs Assessment: post-procedure vital signs reviewed and stable Respiratory status: spontaneous breathing, nonlabored ventilation and respiratory function stable Cardiovascular status: stable Postop Assessment: no headache, no backache, epidural receding and no signs of nausea or vomiting (no pruritis) Anesthetic complications: no     Last Vitals: There were no vitals filed for this visit.  Last Pain: There were no vitals filed for this visit.               Mallarie Voorhies

## 2019-05-22 DIAGNOSIS — Z0289 Encounter for other administrative examinations: Secondary | ICD-10-CM

## 2019-05-28 ENCOUNTER — Telehealth: Payer: Self-pay | Admitting: Certified Nurse Midwife

## 2019-05-28 NOTE — Telephone Encounter (Signed)
Pt's insurance called to verify delivery date. Pulled pt's chart/delivery note. Thank you.

## 2019-06-13 ENCOUNTER — Encounter: Payer: Self-pay | Admitting: Certified Nurse Midwife

## 2019-06-13 ENCOUNTER — Other Ambulatory Visit: Payer: Self-pay

## 2019-06-13 ENCOUNTER — Ambulatory Visit: Payer: Commercial Managed Care - PPO | Admitting: Certified Nurse Midwife

## 2019-06-13 NOTE — Progress Notes (Signed)
Subjective:    Mackenzie Crawford is a 33 y.o. G42P1001 Caucasian female who presents for a postpartum visit. She is 5 weeks postpartum following a spontaneous vaginal delivery at 40.6 gestational weeks. Anesthesia: epidural. I have fully reviewed the prenatal and intrapartum course. Postpartum course has been wnl. She has a concern regarding a "lump" she felt in the vagina. Baby's course has been wnl. Baby is feeding by bottle/pumped breast milk. Bleeding no bleeding. Bowel function is normal. Bladder function is normal. Patient is not sexually active. Last sexual activity: prior to delivery. Contraception method is none. Postpartum depression screening: negative.   Last pap 04/04/18 and was negative.  The following portions of the patient's history were reviewed and updated as appropriate: allergies, current medications, past medical history, past surgical history and problem list.  Review of Systems Pertinent items are noted in HPI.   Vitals:   06/13/19 0838  BP: (!) 136/97  Pulse: (!) 59  Weight: 156 lb (70.8 kg)  Height: 5\' 5"  (1.651 m)   No LMP recorded.  Objective:   General:  alert, cooperative and no distress   Breasts:  deferred, no complaints  Lungs: clear to auscultation bilaterally  Heart:  regular rate and rhythm  Abdomen: soft, nontender   Vulva: normal  Vagina: normal vagina, normal anatomy no lump identified, pt is pointing to urethra and the tissue on the anterior vaginal wall.   Cervix:  closed  Corpus: Well-involuted  Adnexa:  Non-palpable  Rectal Exam: No hemorrhoids        Assessment:   Postpartum exam 5 wks s/p SVD Pumping feeding Depression screening Contraception counseling   Plan:  : none , natural family planning and condoms  Follow up in: 6 months for annual exam and pt request pap smear annually.  Philip Aspen, CNM

## 2019-09-26 ENCOUNTER — Encounter: Payer: Self-pay | Admitting: *Deleted

## 2019-09-26 ENCOUNTER — Ambulatory Visit
Admission: EM | Admit: 2019-09-26 | Discharge: 2019-09-26 | Disposition: A | Discharge status: Home / Self Care | Payer: Commercial Managed Care - PPO | Attending: Family Medicine | Admitting: Family Medicine

## 2019-09-26 DIAGNOSIS — Z202 Contact with and (suspected) exposure to infections with a predominantly sexual mode of transmission: Secondary | ICD-10-CM

## 2019-09-26 DIAGNOSIS — Z3202 Encounter for pregnancy test, result negative: Secondary | ICD-10-CM

## 2019-09-26 DIAGNOSIS — Z113 Encounter for screening for infections with a predominantly sexual mode of transmission: Secondary | ICD-10-CM | POA: Insufficient documentation

## 2019-09-26 DIAGNOSIS — N898 Other specified noninflammatory disorders of vagina: Secondary | ICD-10-CM | POA: Diagnosis not present

## 2019-09-26 LAB — POCT URINE PREGNANCY: Preg Test, Ur: NEGATIVE

## 2019-09-26 MED ORDER — AZITHROMYCIN 500 MG PO TABS
1000.0000 mg | ORAL_TABLET | Freq: Once | ORAL | Status: AC
  Administered 2019-09-26: 1000 mg via ORAL

## 2019-09-26 MED ORDER — CEFTRIAXONE SODIUM 250 MG IJ SOLR
250.0000 mg | Freq: Once | INTRAMUSCULAR | Status: AC
  Administered 2019-09-26: 250 mg via INTRAMUSCULAR

## 2019-09-26 NOTE — Discharge Instructions (Addendum)
Treating you for STDs today based on your exposure We will call you if your results are positive

## 2019-09-26 NOTE — ED Provider Notes (Signed)
Mackenzie Crawford    CSN: [id number] Arrival date & time: 09/26/19  1055      History   Chief Complaint Chief Complaint  Patient presents with  . SEXUALLY TRANSMITTED DISEASE    HPI Mackenzie Crawford is a 33 y.o. female.   Pt is a 33 year old female that presents for STD screening. Reporting that her partner was having symptoms and treated for STDs. She has had some vaginal discharge but not sure if abnormal or not. Patient's last menstrual period was 08/29/2019. Had a baby in august. No abd pain, back pain, fever, dysuria, hematuria, or urinary frequency. 1 partner currently unprotected.   ROS per HPI       Past Medical History:  Diagnosis Date  . Anxiety   . 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 Kidney stones   . Pelvic pain   . Recurrent streptococcal tonsillitis     Patient Active Problem List   Diagnosis Date Noted  . Labor and delivery, indication for care 05/09/2019  . Encounter for supervision of normal first pregnancy in second trimester 11/01/2018  . STD exposure 02/20/2018  . Dysmenorrhea 12/21/2017  . Benign nevus 11/16/2016  . Panic disorder 06/19/2014    Past Surgical History:  Procedure Laterality Date  . TONSILLECTOMY AND ADENOIDECTOMY  2015  . WISDOM TOOTH EXTRACTION      OB History    Gravida  1   Para  1   Term  1   Preterm      AB      Living  1     SAB      TAB      Ectopic      Multiple  0   Live Births  1            Home Medications    Prior to Admission medications   Medication Sig Start Date End Date Taking? Authorizing Provider  ibuprofen (ADVIL) 600 MG tablet Take 1 tablet (600 mg total) by mouth every 6 (six) hours. 05/10/19   Philip Aspen, CNM    Family History Family History  Problem Relation Age of Onset  . Kidney Stones Father   . Kidney Stones Brother   . Cancer Maternal Grandmother        pancreatic  . Heart disease Maternal Grandfather 29  . Arthritis Paternal Grandmother    . Diabetes Paternal Grandmother     Social History Social History   Tobacco Use  . Smoking status: Former Smoker    Quit date: 11/01/2009    Years since quitting: 9.9  . Smokeless tobacco: Never Used  Substance Use Topics  . Alcohol use: Yes    Alcohol/week: 2.0 standard drinks    Types: 2 drink(s) per week  . Drug use: Yes     Allergies   Patient has no known allergies.   Review of Systems Review of Systems   Physical Exam Triage Vital Signs ED Triage Vitals  Enc Vitals Group     BP 09/26/19 1057 136/88     Pulse Rate 09/26/19 1057 83     Resp 09/26/19 1057 16     Temp 09/26/19 1057 98.6 F (37 C)     Temp Source 09/26/19 1057 Oral     SpO2 09/26/19 1057 99 %     Weight --      Height --      Head Circumference --      Peak Flow --  Pain Score 09/26/19 1056 0     Pain Loc --      Pain Edu? --      Excl. in [place]? --    No data found.  Updated Vital Signs BP 136/88 (BP Location: Left Arm)   Pulse 83   Temp 98.6 F (37 C) (Oral)   Resp 16   LMP 08/29/2019   SpO2 99%   Visual Acuity Right Eye Distance:   Left Eye Distance:   Bilateral Distance:    Right Eye Near:   Left Eye Near:    Bilateral Near:     Physical Exam Vitals signs and nursing note reviewed.  Constitutional:      General: She is not in acute distress.    Appearance: Normal appearance. She is not ill-appearing, toxic-appearing or diaphoretic.  HENT:     Head: Normocephalic.     Nose: Nose normal.     Mouth/Throat:     Pharynx: Oropharynx is clear.  Eyes:     Conjunctiva/sclera: Conjunctivae normal.  Neck:     Musculoskeletal: Normal range of motion.  Pulmonary:     Effort: Pulmonary effort is normal.  Abdominal:     Palpations: Abdomen is soft.     Tenderness: There is no abdominal tenderness.  Musculoskeletal: Normal range of motion.  Skin:    General: Skin is warm and dry.     Findings: No rash.  Neurological:     Mental Status: She is alert.  Psychiatric:         Mood and Affect: Mood normal.      UC Treatments / Results  Labs (all labs ordered are listed, but only abnormal results are displayed) Labs Reviewed  POCT URINE PREGNANCY  CERVICOVAGINAL ANCILLARY ONLY    EKG   Radiology No results found.  Procedures Procedures (including critical care time)  Medications Ordered in UC Medications  cefTRIAXone (ROCEPHIN) injection 250 mg (has no administration in time range)  azithromycin (ZITHROMAX) tablet 1,000 mg (has no administration in time range)    Initial Impression / Assessment and Plan / UC Course  I have reviewed the triage vital signs and the nursing notes.  Pertinent labs & imaging results that were available during my care of the patient were reviewed by me and considered in my medical decision making (see chart for details).     STD screening and treatment.  Based on exposure and symptoms we will go ahead and treat for STDs today.  Pregnancy test negative.   Final Clinical Impressions(s) / UC Diagnoses   Final diagnoses:  Screening for STD (sexually transmitted disease)     Discharge Instructions     Treating you for STDs today based on your exposure We will call you if your results are positive    ED Prescriptions    None     PDMP not reviewed this encounter.   Orvan July, NP 09/26/19 1136

## 2019-09-26 NOTE — ED Triage Notes (Signed)
Patient reports that she may of been exposed to STD. No symptoms. Would like to be tested and treated today just incase.

## 2019-09-30 LAB — CERVICOVAGINAL ANCILLARY ONLY
Chlamydia: NEGATIVE
Neisseria Gonorrhea: NEGATIVE
Trichomonas: NEGATIVE

## 2019-10-01 ENCOUNTER — Encounter: Payer: Self-pay | Admitting: Family

## 2019-10-01 ENCOUNTER — Telehealth: Payer: Self-pay

## 2019-10-01 NOTE — Telephone Encounter (Signed)
Spoke with patient regarding previous message about anxiety medicine. She is wanting to restart xanax. She states she is due to see her PCP and she will followup with them for the xanax prescription.

## 2019-10-06 ENCOUNTER — Ambulatory Visit (INDEPENDENT_AMBULATORY_CARE_PROVIDER_SITE_OTHER): Payer: Commercial Managed Care - PPO | Admitting: Family

## 2019-10-06 ENCOUNTER — Encounter: Payer: Self-pay | Admitting: Family

## 2019-10-06 DIAGNOSIS — F410 Panic disorder [episodic paroxysmal anxiety] without agoraphobia: Secondary | ICD-10-CM

## 2019-10-06 MED ORDER — BUSPIRONE HCL 5 MG PO TABS: 5.0000 mg | ORAL_TABLET | Freq: Three times a day (TID) | ORAL | 1 refills | Status: DC | PRN

## 2019-10-06 NOTE — Assessment & Plan Note (Signed)
Agreed on trial of BuSpar.  I discussed risks and my concerns with xanax, however I do not have concerns with this patient in returning to Xanax as needed if BuSpar were to fail. Patient will let me know how she is doing

## 2019-10-06 NOTE — Progress Notes (Signed)
Virtual Visit via Video Note  I connected with@  on 10/06/19 at 12:00 PM EST by a video enabled telemedicine application and verified that I am speaking with the correct person using two identifiers.  Location patient: home Location provider: home office Persons participating in the virtual visit: patient, provider  I discussed the limitations of evaluation and management by telemedicine and the availability of in person appointments. The patient expressed understanding and agreed to proceed.   HPI:  Chief complaint of increased anxiety. Has a 9-month-old baby at home Would like to start as need medication.  Does not feel like she needed any medication.  She denies any depression . feels overwhelmed with motherhood. Husband is very supportive and helpful.  Denies SI/HI  Has been on xanax prn in the past; used very sparingly Drinks 1 glass of wine most evenings to relax.   ROS: See pertinent positives and negatives per HPI.  Past Medical History:  Diagnosis Date  . Anxiety   . 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 Kidney stones   . Pelvic pain   . Recurrent streptococcal tonsillitis     Past Surgical History:  Procedure Laterality Date  . TONSILLECTOMY AND ADENOIDECTOMY  2015  . WISDOM TOOTH EXTRACTION      Family History  Problem Relation Age of Onset  . Kidney Stones Father   . Kidney Stones Brother   . Cancer Maternal Grandmother        pancreatic  . Heart disease Maternal Grandfather 19  . Arthritis Paternal Grandmother   . Diabetes Paternal Grandmother     SOCIAL HX: former smoker   Current Outpatient Medications:  .  busPIRone (BUSPAR) 5 MG tablet, Take 1 tablet (5 mg total) by mouth 3 (three) times daily as needed., Disp: 60 tablet, Rfl: 1  EXAM:  VITALS per patient if applicable:  GENERAL: alert, oriented, appears well and in no acute distress  HEENT: atraumatic, conjunttiva clear, no obvious abnormalities on inspection of external nose and  ears  NECK: normal movements of the head and neck  LUNGS: on inspection no signs of respiratory distress, breathing rate appears normal, no obvious gross SOB, gasping or wheezing  CV: no obvious cyanosis  MS: moves all visible extremities without noticeable abnormality  PSYCH/NEURO: pleasant and cooperative, no obvious depression or anxiety, speech and thought processing grossly intact  ASSESSMENT AND PLAN:  Discussed the following assessment and plan:  Panic disorder  Problem List Items Addressed This Visit      Other   Panic disorder    Agreed on trial of BuSpar.  I discussed risks and my concerns with xanax, however I do not have concerns with this patient in returning to Xanax as needed if BuSpar were to fail. Patient will let me know how she is doing      Relevant Medications   busPIRone (BUSPAR) 5 MG tablet      -we discussed possible serious and likely etiologies, options for evaluation and workup, limitations of telemedicine visit vs in person visit, treatment, treatment risks and precautions. Pt prefers to treat via telemedicine empirically rather then risking or undertaking an in person visit at this moment. Patient agrees to seek prompt in person care if worsening, new symptoms arise, or if is not improving with treatment.   I discussed the assessment and treatment plan with the patient. The patient was provided an opportunity to ask questions and all were answered. The patient agreed with the plan and  demonstrated an understanding of the instructions.   The patient was advised to call back or seek an in-person evaluation if the symptoms worsen or if the condition fails to improve as anticipated.   Mable Paris, FNP

## 2019-10-09 ENCOUNTER — Encounter: Payer: Self-pay | Admitting: Family

## 2019-10-10 ENCOUNTER — Other Ambulatory Visit: Payer: Self-pay | Admitting: Family

## 2019-10-10 DIAGNOSIS — F410 Panic disorder [episodic paroxysmal anxiety] without agoraphobia: Secondary | ICD-10-CM

## 2019-10-10 MED ORDER — HYDROXYZINE HCL 10 MG PO TABS: 10.0000 mg | ORAL_TABLET | Freq: Two times a day (BID) | ORAL | 0 refills | Status: DC | PRN

## 2019-10-24 ENCOUNTER — Other Ambulatory Visit: Payer: Self-pay | Admitting: Family

## 2019-10-24 DIAGNOSIS — F410 Panic disorder [episodic paroxysmal anxiety] without agoraphobia: Secondary | ICD-10-CM

## 2019-11-04 ENCOUNTER — Other Ambulatory Visit (HOSPITAL_COMMUNITY)
Admission: RE | Admit: 2019-11-04 | Discharge: 2019-11-04 | Disposition: A | Discharge status: Home / Self Care | Payer: Commercial Managed Care - PPO | Source: Ambulatory Visit | Attending: Certified Nurse Midwife | Admitting: Certified Nurse Midwife

## 2019-11-04 ENCOUNTER — Encounter: Payer: Self-pay | Admitting: Certified Nurse Midwife

## 2019-11-04 ENCOUNTER — Ambulatory Visit (INDEPENDENT_AMBULATORY_CARE_PROVIDER_SITE_OTHER): Payer: Commercial Managed Care - PPO | Admitting: Certified Nurse Midwife

## 2019-11-04 ENCOUNTER — Other Ambulatory Visit: Payer: Self-pay

## 2019-11-04 VITALS — BP 118/84 | HR 57 | Ht 66.0 in | Wt 158.5 lb

## 2019-11-04 DIAGNOSIS — Z01419 Encounter for gynecological examination (general) (routine) without abnormal findings: Secondary | ICD-10-CM

## 2019-11-04 DIAGNOSIS — Z124 Encounter for screening for malignant neoplasm of cervix: Secondary | ICD-10-CM

## 2019-11-04 NOTE — Patient Instructions (Signed)
Preventive Care 21-32 Years Old, Female Preventive care refers to visits with your health care provider and lifestyle choices that can promote health and wellness. This includes:  A yearly physical exam. This may also be called an annual well check.  Regular dental visits and eye exams.  Immunizations.  Screening for certain conditions.  Healthy lifestyle choices, such as eating a healthy diet, getting regular exercise, not using drugs or products that contain nicotine and tobacco, and limiting alcohol use. What can I expect for my preventive care visit? Physical exam Your health care provider will check your:  Height and weight. This may be used to calculate body mass index (BMI), which tells if you are at a healthy weight.  Heart rate and blood pressure.  Skin for abnormal spots. Counseling Your health care provider may ask you questions about your:  Alcohol, tobacco, and drug use.  Emotional well-being.  Home and relationship well-being.  Sexual activity.  Eating habits.  Work and work environment.  Method of birth control.  Menstrual cycle.  Pregnancy history. What immunizations do I need?  Influenza (flu) vaccine  This is recommended every year. Tetanus, diphtheria, and pertussis (Tdap) vaccine  You may need a Td booster every 10 years. Varicella (chickenpox) vaccine  You may need this if you have not been vaccinated. Human papillomavirus (HPV) vaccine  If recommended by your health care provider, you may need three doses over 6 months. Measles, mumps, and rubella (MMR) vaccine  You may need at least one dose of MMR. You may also need a second dose. Meningococcal conjugate (MenACWY) vaccine  One dose is recommended if you are age 19-21 years and a first-year college student living in a residence hall, or if you have one of several medical conditions. You may also need additional booster doses. Pneumococcal conjugate (PCV13) vaccine  You may need  this if you have certain conditions and were not previously vaccinated. Pneumococcal polysaccharide (PPSV23) vaccine  You may need one or two doses if you smoke cigarettes or if you have certain conditions. Hepatitis A vaccine  You may need this if you have certain conditions or if you travel or work in places where you may be exposed to hepatitis A. Hepatitis B vaccine  You may need this if you have certain conditions or if you travel or work in places where you may be exposed to hepatitis B. Haemophilus influenzae type b (Hib) vaccine  You may need this if you have certain conditions. You may receive vaccines as individual doses or as more than one vaccine together in one shot (combination vaccines). Talk with your health care provider about the risks and benefits of combination vaccines. What tests do I need?  Blood tests  Lipid and cholesterol levels. These may be checked every 5 years starting at age 20.  Hepatitis C test.  Hepatitis B test. Screening  Diabetes screening. This is done by checking your blood sugar (glucose) after you have not eaten for a while (fasting).  Sexually transmitted disease (STD) testing.  BRCA-related cancer screening. This may be done if you have a family history of breast, ovarian, tubal, or peritoneal cancers.  Pelvic exam and Pap test. This may be done every 3 years starting at age 21. Starting at age 30, this may be done every 5 years if you have a Pap test in combination with an HPV test. Talk with your health care provider about your test results, treatment options, and if necessary, the need for more tests.   Follow these instructions at home: Eating and drinking   Eat a diet that includes fresh fruits and vegetables, whole grains, lean protein, and low-fat dairy.  Take vitamin and mineral supplements as recommended by your health care provider.  Do not drink alcohol if: ? Your health care provider tells you not to drink. ? You are  pregnant, may be pregnant, or are planning to become pregnant.  If you drink alcohol: ? Limit how much you have to 0-1 drink a day. ? Be aware of how much alcohol is in your drink. In the [place] one drink equals one 12 oz bottle of beer (355 mL), one 5 oz glass of wine (148 mL), or one 1 oz glass of hard liquor (44 mL). Lifestyle  Take daily care of your teeth and gums.  Stay active. Exercise for at least 30 minutes on 5 or more days each week.  Do not use any products that contain nicotine or tobacco, such as cigarettes, e-cigarettes, and chewing tobacco. If you need help quitting, ask your health care provider.  If you are sexually active, practice safe sex. Use a condom or other form of birth control (contraception) in order to prevent pregnancy and STIs (sexually transmitted infections). If you plan to become pregnant, see your health care provider for a preconception visit. What's next?  Visit your health care provider once a year for a well check visit.  Ask your health care provider how often you should have your eyes and teeth checked.  Stay up to date on all vaccines. This information is not intended to replace advice given to you by your health care provider. Make sure you discuss any questions you have with your health care provider. Document Revised: 06/20/2018 Document Reviewed: 06/20/2018 Elsevier Patient Education  2020 [employer].

## 2019-11-04 NOTE — Progress Notes (Signed)
GYNECOLOGY ANNUAL PREVENTATIVE CARE ENCOUNTER NOTE  History:     Mackenzie Crawford is a 34 y.o. G32P1001 female here for a routine annual gynecologic exam.  Current complaints: none.   Denies abnormal vaginal bleeding, discharge, pelvic pain, problems with intercourse or other gynecologic concerns.  Request pap annual , state has history of "cyts"  and pelvic pain that she never found out why.     Social In relationship: heterosexual Lives with family 1 child -boy  Works: at [employer]  Exercise: none but is starting 14 day challenge exersie daily x 20 min  Smoke/Drink/Drugs: alcohol glass wine nightly or every other night   Gynecologic History Patient's last menstrual period was 10/29/2019 (exact date). Contraception: condoms/Family planning  Last Pap: 04/04/2018. Results were: normal  Last mammogram:N/A  .   Obstetric History OB History  Gravida Para Term Preterm AB Living  1 1 1     1   SAB TAB Ectopic Multiple Live Births        0 1    # Outcome Date GA Lbr Len/2nd Weight Sex Delivery Anes PTL Lv  1 Term 05/09/19 [redacted]w[redacted]d 03:27 / 01:15 7 lb 7.2 oz (3.38 kg) M Vag-Spont EPI  LIV    Past Medical History:  Diagnosis Date  . Anxiety   . 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 Kidney stones   . Pelvic pain   . Recurrent streptococcal tonsillitis     Past Surgical History:  Procedure Laterality Date  . TONSILLECTOMY AND ADENOIDECTOMY  2015  . WISDOM TOOTH EXTRACTION      Current Outpatient Medications on File Prior to Visit  Medication Sig Dispense Refill  . busPIRone (BUSPAR) 5 MG tablet Take 1 tablet (5 mg total) by mouth 3 (three) times daily as needed. 60 tablet 1  . hydrOXYzine (ATARAX/VISTARIL) 10 MG tablet TAKE 1 TABLET (10 MG TOTAL) BY MOUTH 2 (TWO) TIMES DAILY AS NEEDED FOR ANXIETY. 180 tablet 1   No current facility-administered medications on file prior to visit.    No Known Allergies  Social History:  reports that she quit smoking about 10  years ago. She has never used smokeless tobacco. She reports current alcohol use of about 2.0 standard drinks of alcohol per week. She reports current drug use.  Family History  Problem Relation Age of Onset  . Kidney Stones Father   . Kidney Stones Brother   . Cancer Maternal Grandmother        pancreatic  . Heart disease Maternal Grandfather 21  . Arthritis Paternal Grandmother   . Diabetes Paternal Grandmother     The following portions of the patient's history were reviewed and updated as appropriate: allergies, current medications, past family history, past medical history, past social history, past surgical history and problem list.  Review of Systems Pertinent items noted in HPI and remainder of comprehensive ROS otherwise negative.  Physical Exam:  BP 118/84   Pulse (!) 57   Ht 5\' 6"  (1.676 m)   Wt 158 lb 8 oz (71.9 kg)   LMP 10/29/2019 (Exact Date)   BMI 25.58 kg/m  CONSTITUTIONAL: Well-developed, well-nourished female in no acute distress.  HENT:  Normocephalic, atraumatic, External right and left ear normal. Oropharynx is clear and moist EYES: Conjunctivae and EOM are normal. Pupils are equal, round, and reactive to light. No scleral icterus.  NECK: Normal range of motion, supple, no masses.  Normal thyroid.  SKIN: Skin is warm and dry. No  rash noted. Not diaphoretic. No erythema. No pallor. MUSCULOSKELETAL: Normal range of motion. No tenderness.  No cyanosis, clubbing, or edema.  2+ distal pulses. NEUROLOGIC: Alert and oriented to person, place, and time. Normal reflexes, muscle tone coordination.  PSYCHIATRIC: Normal mood and affect. Normal behavior. Normal judgment and thought content. CARDIOVASCULAR: Normal heart rate noted, regular rhythm RESPIRATORY: Clear to auscultation bilaterally. Effort and breath sounds normal, no problems with respiration noted. BREASTS: Symmetric in size. No masses, tenderness, skin changes, nipple drainage, or lymphadenopathy  bilaterally. ABDOMEN: Soft, no distention noted.  No tenderness, rebound or guarding.  PELVIC: Normal appearing external genitalia and urethral meatus; normal appearing vaginal mucosa and cervix.  No abnormal discharge noted.  Pap smear obtained.  Normal uterine size, no other palpable masses, no uterine or adnexal tenderness.   Assessment and Plan:    1. Women's annual routine gynecological examination  Will follow up results of pap smear and manage accordingly. Mammogram not indicated Labs: none due Refills: none  Routine preventative health maintenance measures emphasized. Please refer to After Visit Summary for other counseling recommendations.      Philip Aspen, CNM

## 2019-11-10 LAB — CYTOLOGY - PAP
Comment: NEGATIVE
Diagnosis: UNDETERMINED — AB
High risk HPV: NEGATIVE

## 2020-06-17 ENCOUNTER — Encounter: Payer: Self-pay | Admitting: Family

## 2020-06-21 ENCOUNTER — Other Ambulatory Visit: Payer: Self-pay

## 2020-06-21 DIAGNOSIS — Z20822 Contact with and (suspected) exposure to covid-19: Secondary | ICD-10-CM

## 2020-06-21 NOTE — Progress Notes (Signed)
Error

## 2020-06-22 ENCOUNTER — Other Ambulatory Visit: Payer: Commercial Managed Care - PPO

## 2020-07-01 ENCOUNTER — Other Ambulatory Visit: Payer: Self-pay

## 2020-07-01 ENCOUNTER — Other Ambulatory Visit (INDEPENDENT_AMBULATORY_CARE_PROVIDER_SITE_OTHER): Payer: Commercial Managed Care - PPO

## 2020-07-01 DIAGNOSIS — Z20822 Contact with and (suspected) exposure to covid-19: Secondary | ICD-10-CM | POA: Diagnosis not present

## 2020-07-01 LAB — SARS-COV-2 IGG: SARS-COV-2 IgG: 0.04

## 2020-09-29 ENCOUNTER — Encounter: Payer: Self-pay | Admitting: Family

## 2020-10-23 NOTE — L&D Delivery Note (Addendum)
° ° °     Delivery Note   Mackenzie Crawford is a 35 y.o. G2P1001 at [redacted]w[redacted]d Estimated Date of Delivery: 10/17/21  PRE-OPERATIVE DIAGNOSIS:  1) [redacted]w[redacted]d pregnancy.    POST-OPERATIVE DIAGNOSIS:  1) [redacted]w[redacted]d pregnancy s/p Vaginal, Spontaneous    Delivery Type: Vaginal, Spontaneous    Delivery Anesthesia: Epidural   Labor Complications:  none    ESTIMATED BLOOD LOSS: 100  ml    FINDINGS:   1) female infant, Apgar scores of   8 at 1 minute and  9  at 5 minutes and a birthweight pending, infant remains skin to skin.  2) Nuchal cord: no  SPECIMENS:   PLACENTA:   Appearance: Intact    Removal: Spontaneous      Disposition:    DISPOSITION:  Infant to left in stable condition in the delivery room, with L&D personnel and mother,  NARRATIVE SUMMARY: Labor course:  Ms. Mackenzie Crawford is a G2P1001 at [redacted]w[redacted]d who presented for labor management.  She progressed well in labor with pitocin.  She received the appropriate epidural anesthesia and proceeded to complete dilation. She evidenced good maternal expulsive effort during the second stage. She went on to deliver a viable female infant "Camryn Jolane". The placenta delivered without problems and was noted to be complete. A perineal and vaginal examination was performed. Episiotomy/Lacerations: None . The patient tolerated this well.  Philip Aspen, CNM  10/10/2021 8:21 PM

## 2020-11-09 ENCOUNTER — Ambulatory Visit (INDEPENDENT_AMBULATORY_CARE_PROVIDER_SITE_OTHER): Payer: Commercial Managed Care - PPO | Admitting: Certified Nurse Midwife

## 2020-11-09 ENCOUNTER — Other Ambulatory Visit (HOSPITAL_COMMUNITY)
Admission: RE | Admit: 2020-11-09 | Discharge: 2020-11-09 | Disposition: A | Discharge status: Home / Self Care | Payer: Commercial Managed Care - PPO | Source: Ambulatory Visit | Attending: Certified Nurse Midwife | Admitting: Certified Nurse Midwife

## 2020-11-09 ENCOUNTER — Encounter: Payer: Self-pay | Admitting: Certified Nurse Midwife

## 2020-11-09 ENCOUNTER — Other Ambulatory Visit: Payer: Self-pay

## 2020-11-09 VITALS — BP 116/74 | HR 70 | Ht 66.0 in | Wt 160.6 lb

## 2020-11-09 DIAGNOSIS — Z5320 Procedure and treatment not carried out because of patient's decision for unspecified reasons: Secondary | ICD-10-CM

## 2020-11-09 DIAGNOSIS — Z124 Encounter for screening for malignant neoplasm of cervix: Secondary | ICD-10-CM | POA: Diagnosis not present

## 2020-11-09 DIAGNOSIS — Z01419 Encounter for gynecological examination (general) (routine) without abnormal findings: Secondary | ICD-10-CM | POA: Insufficient documentation

## 2020-11-09 DIAGNOSIS — Z13220 Encounter for screening for lipoid disorders: Secondary | ICD-10-CM | POA: Diagnosis not present

## 2020-11-09 NOTE — Progress Notes (Signed)
GYNECOLOGY ANNUAL PREVENTATIVE CARE ENCOUNTER NOTE  History:     Mackenzie Crawford is a 35 y.o. G14P1001 female here for a routine annual gynecologic exam.  Current complaints: none.   Denies abnormal vaginal bleeding, discharge, pelvic pain, problems with intercourse or other gynecologic concerns.     Social Relationship: Living: with family and son Work:[employer]  Exercise: not currently  Smoke/Alcohol/drug use: occasional alcohol use  Gynecologic History Patient's last menstrual period was 10/15/2020 (exact date). Contraception: condoms and family planning Last Pap: 11/04/19. Results were: abnormal with negative HPV Last mammogram: n/a .    Upstream - 11/09/20 1323      Pregnancy Intention Screening   Does the patient want to become pregnant in the next year? Yes    Does the patient's partner want to become pregnant in the next year? Yes    Would the patient like to discuss contraceptive options today? No      Contraception Wrap Up   Contraception Counseling Provided No          The pregnancy intention screening data noted above was reviewed. Potential methods of contraception were discussed. The patient elected to proceed with Female Condom.    Obstetric History OB History  Gravida Para Term Preterm AB Living  1 1 1     1   SAB IAB Ectopic Multiple Live Births        0 1    # Outcome Date GA Lbr Len/2nd Weight Sex Delivery Anes PTL Lv  1 Term 05/09/19 [redacted]w[redacted]d 03:27 / 01:15 7 lb 7.2 oz (3.38 kg) M Vag-Spont EPI  LIV    Past Medical History:  Diagnosis Date  . Anxiety   . 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 Kidney stones   . Pelvic pain   . Recurrent streptococcal tonsillitis     Past Surgical History:  Procedure Laterality Date  . TONSILLECTOMY AND ADENOIDECTOMY  2015  . WISDOM TOOTH EXTRACTION      Current Outpatient Medications on File Prior to Visit  Medication Sig Dispense Refill  . busPIRone (BUSPAR) 5 MG tablet Take 1 tablet (5 mg  total) by mouth 3 (three) times daily as needed. 60 tablet 1  . hydrOXYzine (ATARAX/VISTARIL) 10 MG tablet TAKE 1 TABLET (10 MG TOTAL) BY MOUTH 2 (TWO) TIMES DAILY AS NEEDED FOR ANXIETY. 180 tablet 1  . Prenatal Vit-Fe Fumarate-FA (PRENATAL MULTIVITAMIN) TABS tablet Take 1 tablet by mouth daily at 12 noon.     No current facility-administered medications on file prior to visit.    No Known Allergies  Social History:  reports that she quit smoking about 11 years ago. She has never used smokeless tobacco. She reports current alcohol use of about 2.0 standard drinks of alcohol per week. She reports previous drug use.  Family History  Problem Relation Age of Onset  . Kidney Stones Father   . Kidney Stones Brother   . Cancer Maternal Grandmother        pancreatic  . Heart disease Maternal Grandfather 63  . Arthritis Paternal Grandmother   . Diabetes Paternal Grandmother     The following portions of the patient's history were reviewed and updated as appropriate: allergies, current medications, past family history, past medical history, past social history, past surgical history and problem list.  Review of Systems Pertinent items noted in HPI and remainder of comprehensive ROS otherwise negative.  Physical Exam:  BP 116/74   Pulse 70  Ht 5\' 6"  (1.676 m)   Wt 160 lb 9 oz (72.8 kg)   LMP 10/15/2020 (Exact Date)   BMI 25.92 kg/m  CONSTITUTIONAL: Well-developed, well-nourished female in no acute distress.  HENT:  Normocephalic, atraumatic, External right and left ear normal. Oropharynx is clear and moist EYES: Conjunctivae and EOM are normal. Pupils are equal, round, and reactive to light. No scleral icterus.  NECK: Normal range of motion, supple, no masses.  Normal thyroid.  SKIN: Skin is warm and dry. No rash noted. Not diaphoretic. No erythema. No pallor. MUSCULOSKELETAL: Normal range of motion. No tenderness.  No cyanosis, clubbing, or edema.  2+ distal pulses. NEUROLOGIC: Alert  and oriented to person, place, and time. Normal reflexes, muscle tone coordination.  PSYCHIATRIC: Normal mood and affect. Normal behavior. Normal judgment and thought content. CARDIOVASCULAR: Normal heart rate noted, regular rhythm RESPIRATORY: Clear to auscultation bilaterally. Effort and breath sounds normal, no problems with respiration noted. BREASTS: Symmetric in size. No masses, tenderness, skin changes, nipple drainage, or lymphadenopathy bilaterally.  ABDOMEN: Soft, no distention noted.  No tenderness, rebound or guarding.  PELVIC: Normal appearing external genitalia and urethral meatus; normal appearing vaginal mucosa and cervix.  No abnormal discharge noted.  Pap smear obtained.  Normal uterine size, no other palpable masses, no uterine or adnexal tenderness.  .   Assessment and Plan:    Annual Gyn exam   Pap:Will follow up results of pap smear and manage accordingly. Mammogram : n/a  Labs: lipid profile , hep c  Refills: none Referral: none Routine preventative health maintenance measures emphasized. Please refer to After Visit Summary for other counseling recommendations.      Philip Aspen, CNM [facility]

## 2020-11-09 NOTE — Patient Instructions (Signed)
Preventive Care 21-33 Years Old, Female Preventive care refers to lifestyle choices and visits with your health care provider that can promote health and wellness. This includes:  A yearly physical exam. This is also called an annual wellness visit.  Regular dental and eye exams.  Immunizations.  Screening for certain conditions.  Healthy lifestyle choices, such as: ? Eating a healthy diet. ? Getting regular exercise. ? Not using drugs or products that contain nicotine and tobacco. ? Limiting alcohol use. What can I expect for my preventive care visit? Physical exam Your health care provider may check your:  Height and weight. These may be used to calculate your BMI (body mass index). BMI is a measurement that tells if you are at a healthy weight.  Heart rate and blood pressure.  Body temperature.  Skin for abnormal spots. Counseling Your health care provider may ask you questions about your:  Past medical problems.  Family's medical history.  Alcohol, tobacco, and drug use.  Emotional well-being.  Home life and relationship well-being.  Sexual activity.  Diet, exercise, and sleep habits.  Work and work environment.  Access to firearms.  Method of birth control.  Menstrual cycle.  Pregnancy history. What immunizations do I need? Vaccines are usually given at various ages, according to a schedule. Your health care provider will recommend vaccines for you based on your age, medical history, and lifestyle or other factors, such as travel or where you work.   What tests do I need? Blood tests  Lipid and cholesterol levels. These may be checked every 5 years starting at age 20.  Hepatitis C test.  Hepatitis B test. Screening  Diabetes screening. This is done by checking your blood sugar (glucose) after you have not eaten for a while (fasting).  STD (sexually transmitted disease) testing, if you are at risk.  BRCA-related cancer screening. This may be  done if you have a family history of breast, ovarian, tubal, or peritoneal cancers.  Pelvic exam and Pap test. This may be done every 3 years starting at age 21. Starting at age 30, this may be done every 5 years if you have a Pap test in combination with an HPV test. Talk with your health care provider about your test results, treatment options, and if necessary, the need for more tests.   Follow these instructions at home: Eating and drinking  Eat a healthy diet that includes fresh fruits and vegetables, whole grains, lean protein, and low-fat dairy products.  Take vitamin and mineral supplements as recommended by your health care provider.  Do not drink alcohol if: ? Your health care provider tells you not to drink. ? You are pregnant, may be pregnant, or are planning to become pregnant.  If you drink alcohol: ? Limit how much you have to 0-1 drink a day. ? Be aware of how much alcohol is in your drink. In the [place] one drink equals one 12 oz bottle of beer (355 mL), one 5 oz glass of wine (148 mL), or one 1 oz glass of hard liquor (44 mL).   Lifestyle  Take daily care of your teeth and gums. Brush your teeth every morning and night with fluoride toothpaste. Floss one time each day.  Stay active. Exercise for at least 30 minutes 5 or more days each week.  Do not use any products that contain nicotine or tobacco, such as cigarettes, e-cigarettes, and chewing tobacco. If you need help quitting, ask your health care provider.  Do not   use drugs.  If you are sexually active, practice safe sex. Use a condom or other form of protection to prevent STIs (sexually transmitted infections).  If you do not wish to become pregnant, use a form of birth control. If you plan to become pregnant, see your health care provider for a prepregnancy visit.  Find healthy ways to cope with stress, such as: ? Meditation, yoga, or listening to music. ? Journaling. ? Talking to a trusted  person. ? Spending time with friends and family. Safety  Always wear your seat belt while driving or riding in a vehicle.  Do not drive: ? If you have been drinking alcohol. Do not ride with someone who has been drinking. ? When you are tired or distracted. ? While texting.  Wear a helmet and other protective equipment during sports activities.  If you have firearms in your house, make sure you follow all gun safety procedures.  Seek help if you have been physically or sexually abused. What's next?  Go to your health care provider once a year for an annual wellness visit.  Ask your health care provider how often you should have your eyes and teeth checked.  Stay up to date on all vaccines. This information is not intended to replace advice given to you by your health care provider. Make sure you discuss any questions you have with your health care provider. Document Revised: 06/06/2020 Document Reviewed: 06/20/2018 Elsevier Patient Education  2021 [employer].  

## 2020-11-10 LAB — LIPID PANEL
Chol/HDL Ratio: 2.8 ratio (ref 0.0–4.4)
Cholesterol, Total: 191 mg/dL (ref 100–199)
HDL: 69 mg/dL (ref >39)
LDL Chol Calc (NIH): 107 mg/dL — ABNORMAL HIGH (ref 0–99)
Triglycerides: 82 mg/dL (ref 0–149)
VLDL Cholesterol Cal: 15 mg/dL (ref 5–40)

## 2020-11-10 LAB — HEPATITIS C ANTIBODY: Hep C Virus Ab: 0.1 s/co ratio (ref 0.0–0.9)

## 2020-11-13 LAB — CYTOLOGY - PAP
Diagnosis: NEGATIVE
Diagnosis: REACTIVE

## 2021-02-14 ENCOUNTER — Encounter: Payer: Commercial Managed Care - PPO | Admitting: Certified Nurse Midwife

## 2021-02-23 ENCOUNTER — Other Ambulatory Visit: Payer: Self-pay

## 2021-02-23 ENCOUNTER — Ambulatory Visit: Payer: Commercial Managed Care - PPO | Admitting: Certified Nurse Midwife

## 2021-02-23 ENCOUNTER — Encounter: Payer: Self-pay | Admitting: Certified Nurse Midwife

## 2021-02-23 VITALS — BP 125/79 | HR 65 | Resp 16 | Ht 65.0 in | Wt 160.1 lb

## 2021-02-23 DIAGNOSIS — Z3200 Encounter for pregnancy test, result unknown: Secondary | ICD-10-CM

## 2021-02-23 LAB — POCT URINE PREGNANCY: Preg Test, Ur: POSITIVE — AB

## 2021-02-23 NOTE — Progress Notes (Signed)
Subjective:    Mackenzie Crawford is a 35 y.o. female who presents for evaluation of amenorrhea. She believes she could be pregnant. Pregnancy is desired. Sexual Activity: single partner, contraception: none. Current symptoms also include: fatigue and nausea. Last period was normal.   No LMP recorded. The following portions of the patient's history were reviewed and updated as appropriate: allergies, current medications, past family history, past medical history, past social history, past surgical history and problem list.  Review of Systems Pertinent items are noted in HPI.     Objective:    There were no vitals taken for this visit. General: alert, cooperative, appears stated age, no distress and no acute distress    Lab Review Urine HCG: positive    Assessment:    Absence of menstruation.     Plan:   Positive: EDC: 10/17/21. Briefly discussed pre-natal care options. Md and Midwifery care reviewed.  Encouraged well-balanced diet, plenty of rest when needed, pre-natal vitamins daily and walking for exercise. Discussed self-help for nausea, avoiding OTC medications until consulting provider or pharmacist, other than Tylenol as needed, minimal caffeine (1-2 cups daily) and avoiding alcohol. She will schedule her initial nurse visit @ 10 wks and her NOB visit @[redacted]  wk pregnant. . Feel free to call with any questions.   Philip Aspen, CNM

## 2021-02-23 NOTE — Patient Instructions (Signed)
https://[email or web address].org/womens-[email or web address]/[email or web address]-[email or web address]  Prenatal Care Prenatal care is health care during pregnancy. It helps you and your unborn baby (fetus) stay as healthy as possible. Prenatal care may be provided by a midwife, a family practice doctor, a IT consultant (nurse practitioner or physician assistant), or a childbirth and pregnancy doctor (obstetrician). How does this affect me? During pregnancy, you will be closely monitored for any new conditions that might develop. To lower your risk of pregnancy complications, you and your health care provider will talk about any underlying conditions you have. How does this affect my baby? Early and consistent prenatal care increases the chance that your baby will be healthy during pregnancy. Prenatal care lowers the risk that your baby will be:  Born early (prematurely).  Smaller than expected at birth (small for gestational age). What can I expect at the first prenatal care visit? Your first prenatal care visit will likely be the longest. You should schedule your first prenatal care visit as soon as you know that you are pregnant. Your first visit is a good time to talk about any questions or concerns you have about pregnancy. Medical history At your visit, you and your health care provider will talk about your medical history, including:  Any past pregnancies.  Your family's medical history.  Medical history of the baby's father.  Any long-term (chronic) health conditions you have and how you manage them.  Any surgeries or procedures you have had.  Any current over-the-counter or prescription medicines, herbs, or supplements that you are taking.  Other factors that could pose a risk to your baby, including: ? Exposure to harmful chemicals or radiation at work or at home. ? Any substance use, including tobacco, alcohol, and drug use.  Your home setting and your stress levels, including: ? Exposure to  abuse or violence. ? Household financial strain.  Your daily health habits, including diet and exercise. Tests and screenings Your health care provider will:  Measure your weight, height, and blood pressure.  Do a physical exam, including a pelvic and breast exam.  Perform blood tests and urine tests to check for: ? Urinary tract infection. ? Sexually transmitted infections (STIs). ? Low iron levels in your blood (anemia). ? Blood type and certain proteins on red blood cells (Rh antibodies). ? Infections and immunity to viruses, such as hepatitis B and rubella. ? HIV (human immunodeficiency virus).  Discuss your options for genetic screening. Tips about staying healthy Your health care provider will also give you information about how to keep yourself and your baby healthy, including:  Nutrition and taking vitamins.  Physical activity.  How to manage pregnancy symptoms such as nausea and vomiting (morning sickness).  Infections and substances that may be harmful to your baby and how to avoid them.  Food safety.  Dental care.  Working.  Travel.  Warning signs to watch for and when to call your health care provider. How often will I have prenatal care visits? After your first prenatal care visit, you will have regular visits throughout your pregnancy. The visit schedule is often as follows:  Up to week 28 of pregnancy: once every 4 weeks.  28-36 weeks: once every 2 weeks.  After 36 weeks: every week until delivery. Some women may have visits more or less often depending on any underlying health conditions and the health of the baby. Keep all follow-up and prenatal care visits. This is important. What happens during routine prenatal care visits? Your health care provider will:  Measure your weight  and blood pressure.  Check for fetal heart sounds.  Measure the height of your uterus in your abdomen (fundal height). This may be measured starting around week 20 of  pregnancy.  Check the position of your baby inside your uterus.  Ask questions about your diet, sleeping patterns, and whether you can feel the baby move.  Review warning signs to watch for and signs of labor.  Ask about any pregnancy symptoms you are having and how you are dealing with them. Symptoms may include: ? Headaches. ? Nausea and vomiting. ? Vaginal discharge. ? Swelling. ? Fatigue. ? Constipation. ? Changes in your vision. ? Feeling persistently sad or anxious. ? Any discomfort, including back or pelvic pain. ? Bleeding or spotting. Make a list of questions to ask your health care provider at your routine visits.   What tests might I have during prenatal care visits? You may have blood, urine, and imaging tests throughout your pregnancy, such as:  Urine tests to check for glucose, protein, or signs of infection.  Glucose tests to check for a form of diabetes that can develop during pregnancy (gestational diabetes mellitus). This is usually done around week 24 of pregnancy.  Ultrasounds to check your baby's growth and development, to check for birth defects, and to check your baby's well-being. These can also help to decide when you should deliver your baby.  A test to check for group B strep (GBS) infection. This is usually done around week 36 of pregnancy.  Genetic testing. This may include blood, fluid, or tissue sampling, or imaging tests, such as an ultrasound. Some genetic tests are done during the first trimester and some are done during the second trimester. What else can I expect during prenatal care visits? Your health care provider may recommend getting certain vaccines during pregnancy. These may include:  A yearly flu shot (annual influenza vaccine). This is especially important if you will be pregnant during flu season.  Tdap (tetanus, diphtheria, pertussis) vaccine. Getting this vaccine during pregnancy can protect your baby from whooping cough  (pertussis) after birth. This vaccine may be recommended between weeks 27 and 36 of pregnancy.  A COVID-19 vaccine. Later in your pregnancy, your health care provider may give you information about:  Childbirth and breastfeeding classes.  Choosing a health care provider for your baby.  Umbilical cord banking.  Breastfeeding.  Birth control after your baby is born.  The hospital labor and delivery unit and how to set up a tour.  Registering at the hospital before you go into labor. Where to find more information  Office on Women's Health: LegalWarrants.gl  American Pregnancy Association: americanpregnancy.org  March of Dimes: marchofdimes.org Summary  Prenatal care helps you and your baby stay as healthy as possible during pregnancy.  Your first prenatal care visit will most likely be the longest.  You will have visits and tests throughout your pregnancy to monitor your health and your baby's health.  Bring a list of questions to your visits to ask your health care provider.  Make sure to keep all follow-up and prenatal care visits. This information is not intended to replace advice given to you by your health care provider. Make sure you discuss any questions you have with your health care provider. Document Revised: 07/22/2020 Document Reviewed: 07/22/2020 Elsevier Patient Education  [place].

## 2021-03-16 ENCOUNTER — Other Ambulatory Visit: Payer: Self-pay

## 2021-03-16 ENCOUNTER — Ambulatory Visit
Admission: RE | Admit: 2021-03-16 | Discharge: 2021-03-16 | Disposition: A | Discharge status: Home / Self Care | Payer: Commercial Managed Care - PPO | Source: Ambulatory Visit | Attending: Emergency Medicine | Admitting: Emergency Medicine

## 2021-03-16 VITALS — BP 139/83 | HR 84 | Temp 98.6°F | Resp 16 | Ht 65.0 in | Wt 160.0 lb

## 2021-03-16 DIAGNOSIS — H6692 Otitis media, unspecified, left ear: Secondary | ICD-10-CM | POA: Diagnosis not present

## 2021-03-16 DIAGNOSIS — Z3A09 9 weeks gestation of pregnancy: Secondary | ICD-10-CM | POA: Diagnosis not present

## 2021-03-16 MED ORDER — AMOXICILLIN 875 MG PO TABS: 875.0000 mg | ORAL_TABLET | Freq: Two times a day (BID) | ORAL | 0 refills | Status: AC

## 2021-03-16 NOTE — Discharge Instructions (Signed)
Take the amoxicillin as directed.  Take Tylenol as needed for discomfort.  Follow-up with your PCP or OB/GYN if your symptoms are not improving.

## 2021-03-16 NOTE — ED Provider Notes (Signed)
Mackenzie Crawford    CSN: [id number] Arrival date & time: 03/16/21  1147      History   Chief Complaint Chief Complaint  Patient presents with  . Otalgia    HPI Mackenzie Crawford is a 35 y.o. female.   Patient presents with 1 day history of left ear pain.  She states she has had nasal congestion and fullness for a few days.  She is currently [redacted] weeks pregnant.  She denies fever, rash, sore throat, cough, shortness of breath, vomiting, diarrhea, or other symptoms.  Treatment at home with Tylenol and with her son's eardrops which he had been previously prescribed.    The history is provided by the patient.    Past Medical History:  Diagnosis Date  . Anxiety   . 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 Kidney stones   . Pelvic pain   . Recurrent streptococcal tonsillitis     Patient Active Problem List   Diagnosis Date Noted  . STD exposure 02/20/2018  . Dysmenorrhea 12/21/2017  . Benign nevus 11/16/2016  . Panic disorder 06/19/2014    Past Surgical History:  Procedure Laterality Date  . TONSILLECTOMY AND ADENOIDECTOMY  2015  . WISDOM TOOTH EXTRACTION      OB History    Gravida  2   Para  1   Term  1   Preterm      AB      Living  1     SAB      IAB      Ectopic      Multiple  0   Live Births  1            Home Medications    Prior to Admission medications   Medication Sig Start Date End Date Taking? Authorizing Provider  amoxicillin (AMOXIL) 875 MG tablet Take 1 tablet (875 mg total) by mouth 2 (two) times daily for 7 days. 03/16/21 03/23/21 Yes Sharion Balloon, NP  busPIRone (BUSPAR) 5 MG tablet Take 1 tablet (5 mg total) by mouth 3 (three) times daily as needed. Patient not taking: Reported on 02/23/2021 10/06/19   Burnard Hawthorne, FNP  hydrOXYzine (ATARAX/VISTARIL) 10 MG tablet TAKE 1 TABLET (10 MG TOTAL) BY MOUTH 2 (TWO) TIMES DAILY AS NEEDED FOR ANXIETY. Patient not taking: Reported on 02/23/2021 10/27/19   Burnard Hawthorne, FNP   Prenatal Vit-Fe Fumarate-FA (PRENATAL MULTIVITAMIN) TABS tablet Take 1 tablet by mouth daily at 12 noon.    [provider]    Family History Family History  Problem Relation Age of Onset  . Kidney Stones Father   . Kidney Stones Brother   . Cancer Maternal Grandmother        pancreatic  . Heart disease Maternal Grandfather 58  . Arthritis Paternal Grandmother   . Diabetes Paternal Grandmother     Social History Social History   Tobacco Use  . Smoking status: Former Smoker    Quit date: 11/01/2009    Years since quitting: 11.3  . Smokeless tobacco: Never Used  Substance Use Topics  . Alcohol use: Yes    Alcohol/week: 2.0 standard drinks    Types: 2 Standard drinks or equivalent per week  . Drug use: Not Currently     Allergies   Patient has no known allergies.   Review of Systems Review of Systems  Constitutional: Negative for chills and fever.  HENT: Positive for congestion and ear pain. Negative for sore  throat.   Respiratory: Negative for cough and shortness of breath.   Cardiovascular: Negative for chest pain and palpitations.  Gastrointestinal: Negative for abdominal pain, diarrhea and vomiting.  Skin: Negative for color change and rash.  All other systems reviewed and are negative.    Physical Exam Triage Vital Signs ED Triage Vitals  Enc Vitals Group     BP      Pulse      Resp      Temp      Temp src      SpO2      Weight      Height      Head Circumference      Peak Flow      Pain Score      Pain Loc      Pain Edu?      Excl. in [place]?    No data found.  Updated Vital Signs BP 139/83   Pulse 84   Temp 98.6 F (37 C) (Oral)   Resp 16   Ht 5\' 5"  (1.651 m)   Wt 160 lb (72.6 kg)   LMP 01/10/2021 (Exact Date)   SpO2 99%   BMI 26.63 kg/m   Visual Acuity Right Eye Distance:   Left Eye Distance:   Bilateral Distance:    Right Eye Near:   Left Eye Near:    Bilateral Near:     Physical Exam Vitals and nursing note  reviewed.  Constitutional:      General: She is not in acute distress.    Appearance: She is well-developed. She is not ill-appearing.  HENT:     Head: Normocephalic and atraumatic.     Right Ear: Tympanic membrane and ear canal normal.     Left Ear: Ear canal normal. Tympanic membrane is erythematous and bulging.     Nose: Nose normal.     Mouth/Throat:     Mouth: Mucous membranes are moist.  Eyes:     Conjunctiva/sclera: Conjunctivae normal.  Cardiovascular:     Rate and Rhythm: Normal rate and regular rhythm.     Heart sounds: Normal heart sounds.  Pulmonary:     Effort: Pulmonary effort is normal. No respiratory distress.     Breath sounds: Normal breath sounds.  Abdominal:     Palpations: Abdomen is soft.     Tenderness: There is no abdominal tenderness.  Musculoskeletal:     Cervical back: Neck supple.  Skin:    General: Skin is warm and dry.  Neurological:     General: No focal deficit present.     Mental Status: She is alert and oriented to person, place, and time.  Psychiatric:        Mood and Affect: Mood normal.        Behavior: Behavior normal.      UC Treatments / Results  Labs (all labs ordered are listed, but only abnormal results are displayed) Labs Reviewed - No data to display  EKG   Radiology No results found.  Procedures Procedures (including critical care time)  Medications Ordered in UC Medications - No data to display  Initial Impression / Assessment and Plan / UC Course  I have reviewed the triage vital signs and the nursing notes.  Pertinent labs & imaging results that were available during my care of the patient were reviewed by me and considered in my medical decision making (see chart for details).   Left otitis media.  9 weeks pregnancy per patient.  Treating with amoxicillin.  Tylenol as needed.  Instructed patient to follow-up with her PCP or OB/GYN if her symptoms are not improving.  She agrees to plan of care.     Final  Clinical Impressions(s) / UC Diagnoses   Final diagnoses:  Left otitis media, unspecified otitis media type  [redacted] weeks gestation of pregnancy     Discharge Instructions     Take the amoxicillin as directed.  Take Tylenol as needed for discomfort.  Follow-up with your PCP or OB/GYN if your symptoms are not improving.        ED Prescriptions    Medication Sig Dispense Auth. Provider   amoxicillin (AMOXIL) 875 MG tablet Take 1 tablet (875 mg total) by mouth 2 (two) times daily for 7 days. 14 tablet Sharion Balloon, NP     PDMP not reviewed this encounter.   Sharion Balloon, NP 03/16/21 [phone]

## 2021-03-16 NOTE — ED Triage Notes (Signed)
Pt reports having L ear pain that began last night. sts she has had nasal congestion for a few days. sts she used ear drops last night and had some relief.

## 2021-03-25 ENCOUNTER — Ambulatory Visit (INDEPENDENT_AMBULATORY_CARE_PROVIDER_SITE_OTHER): Payer: Commercial Managed Care - PPO

## 2021-03-25 ENCOUNTER — Other Ambulatory Visit: Payer: Self-pay | Admitting: Certified Nurse Midwife

## 2021-03-25 VITALS — BP 136/83 | HR 76 | Ht 65.0 in | Wt 162.8 lb

## 2021-03-25 DIAGNOSIS — Z0283 Encounter for blood-alcohol and blood-drug test: Secondary | ICD-10-CM

## 2021-03-25 DIAGNOSIS — Z113 Encounter for screening for infections with a predominantly sexual mode of transmission: Secondary | ICD-10-CM

## 2021-03-25 DIAGNOSIS — Z3481 Encounter for supervision of other normal pregnancy, first trimester: Secondary | ICD-10-CM

## 2021-03-25 NOTE — Progress Notes (Signed)
      Mackenzie Crawford presents for NOB nurse intake visit. Pregnancy confirmation done at [facility], 02/23/2021, with Philip Aspen, CNM.  G 2.  P 1001.  LMP 01/10/2021.  EDD 10/17/2021.  Ga [redacted]w[redacted]d . Pregnancy education material explained and given.  1 cats in the home.  NOB labs ordered. BMI less than 30. TSH/HbgA1c not ordered. Sickle cell not ordered due to race. HIV and drug screen explained and ordered. Genetic screening discussed. Genetic testing; will complete doing NOB PE with provider. Pt to discuss genetic testing with provider. PNV encouraged. Pt to follow up with provider in 2 weeks for NOB physical. FMLA, [place], HIV/Drug Screening forms reviewed and signed by pt.

## 2021-03-26 LAB — ANTIBODY SCREEN: Antibody Screen: NEGATIVE

## 2021-03-26 LAB — HIV ANTIBODY (ROUTINE TESTING W REFLEX): HIV Screen 4th Generation wRfx: NONREACTIVE

## 2021-03-26 LAB — MICROSCOPIC EXAMINATION
Casts: NONE SEEN /lpf
Epithelial Cells (non renal): >10 /hpf — AB (ref 0–10)

## 2021-03-26 LAB — URINALYSIS, ROUTINE W REFLEX MICROSCOPIC
Bilirubin, UA: NEGATIVE
Glucose, UA: NEGATIVE
Ketones, UA: NEGATIVE
Nitrite, UA: NEGATIVE
Protein,UA: NEGATIVE
RBC, UA: NEGATIVE
Specific Gravity, UA: 1.02 (ref 1.005–1.030)
Urobilinogen, Ur: 0.2 mg/dL (ref 0.2–1.0)
pH, UA: 7.5 (ref 5.0–7.5)

## 2021-03-26 LAB — RPR: RPR Ser Ql: NONREACTIVE

## 2021-03-26 LAB — TOXOPLASMA ANTIBODIES- IGG AND  IGM
Toxoplasma Antibody- IgM: <3 AU/mL (ref 0.0–7.9)
Toxoplasma IgG Ratio: <3 IU/mL (ref 0.0–7.1)

## 2021-03-26 LAB — HCV INTERPRETATION

## 2021-03-26 LAB — VIRAL HEPATITIS HBV, HCV
HCV Ab: <0.1 s/co ratio (ref 0.0–0.9)
Hep B Core Total Ab: NEGATIVE
Hep B Surface Ab, Qual: REACTIVE
Hepatitis B Surface Ag: NEGATIVE

## 2021-03-26 LAB — VARICELLA ZOSTER ANTIBODY, IGG: Varicella zoster IgG: <135 index — ABNORMAL LOW (ref >165)

## 2021-03-26 LAB — RUBELLA SCREEN: Rubella Antibodies, IGG: 1.99 index (ref >0.99)

## 2021-03-27 LAB — DRUG PROFILE, UR, 9 DRUGS (LABCORP)
Amphetamines, Urine: NEGATIVE ng/mL
Barbiturate Quant, Ur: NEGATIVE ng/mL
Benzodiazepine Quant, Ur: NEGATIVE ng/mL
Cannabinoid Quant, Ur: NEGATIVE ng/mL
Cocaine (Metab.): NEGATIVE ng/mL
Methadone Screen, Urine: NEGATIVE ng/mL
Opiate Quant, Ur: NEGATIVE ng/mL
PCP Quant, Ur: NEGATIVE ng/mL
Propoxyphene: NEGATIVE ng/mL

## 2021-03-27 LAB — NICOTINE SCREEN, URINE: Cotinine Ql Scrn, Ur: NEGATIVE ng/mL

## 2021-03-28 LAB — GC/CHLAMYDIA PROBE AMP
Chlamydia trachomatis, NAA: NEGATIVE
Neisseria Gonorrhoeae by PCR: NEGATIVE

## 2021-03-30 LAB — URINE CULTURE, OB REFLEX: Organism ID, Bacteria: NO GROWTH

## 2021-03-30 LAB — CULTURE, OB URINE

## 2021-04-01 ENCOUNTER — Ambulatory Visit: Payer: Commercial Managed Care - PPO

## 2021-04-05 ENCOUNTER — Ambulatory Visit (INDEPENDENT_AMBULATORY_CARE_PROVIDER_SITE_OTHER): Payer: Commercial Managed Care - PPO | Admitting: Certified Nurse Midwife

## 2021-04-05 ENCOUNTER — Other Ambulatory Visit: Payer: Self-pay

## 2021-04-05 ENCOUNTER — Other Ambulatory Visit: Payer: Self-pay | Admitting: Certified Nurse Midwife

## 2021-04-05 ENCOUNTER — Encounter: Payer: Self-pay | Admitting: Certified Nurse Midwife

## 2021-04-05 VITALS — BP 121/75 | HR 85 | Wt 162.4 lb

## 2021-04-05 DIAGNOSIS — Z3491 Encounter for supervision of normal pregnancy, unspecified, first trimester: Secondary | ICD-10-CM

## 2021-04-05 DIAGNOSIS — Z3482 Encounter for supervision of other normal pregnancy, second trimester: Secondary | ICD-10-CM

## 2021-04-05 DIAGNOSIS — Z3A12 12 weeks gestation of pregnancy: Secondary | ICD-10-CM

## 2021-04-05 LAB — POCT URINALYSIS DIPSTICK
Bilirubin, UA: NEGATIVE
Blood, UA: NEGATIVE
Glucose, UA: NEGATIVE
Ketones, UA: NEGATIVE
Leukocytes, UA: NEGATIVE
Nitrite, UA: NEGATIVE
Protein, UA: NEGATIVE
Spec Grav, UA: <=1.005 — AB (ref 1.010–1.025)
Urobilinogen, UA: 0.2 E.U./dL
pH, UA: 7.5 (ref 5.0–8.0)

## 2021-04-05 NOTE — Patient Instructions (Signed)
[email or web address]-[email or web address]">  Prenatal Care Prenatal care is health care during pregnancy. It helps you and your unborn baby (fetus) stay as healthy as possible. Prenatal care may be provided by a midwife, a family practice doctor, a IT consultant (nurse practitioner or physician assistant), or a childbirth and pregnancy doctor (obstetrician). How does this affect me? During pregnancy, you will be closely monitored for any new conditions that might develop. To lower your risk of pregnancy complications, you and yourhealth care provider will talk about any underlying conditions you have. How does this affect my baby? Early and consistent prenatal care increases the chance that your baby will be healthy during pregnancy. Prenatal care lowers the risk that your baby will be: Born early (prematurely). Smaller than expected at birth (small for gestational age). What can I expect at the first prenatal care visit? Your first prenatal care visit will likely be the longest. You should schedule your first prenatal care visit as soon as you know that you are pregnant. Your first visit is a good time to talk about any questions or concerns you haveabout pregnancy. Medical history At your visit, you and your health care provider will talk about your medical history, including: Any past pregnancies. Your family's medical history. Medical history of the baby's father. Any long-term (chronic) health conditions you have and how you manage them. Any surgeries or procedures you have had. Any current over-the-counter or prescription medicines, herbs, or supplements that you are taking. Other factors that could pose a risk to your baby, including: Exposure to harmful chemicals or radiation at work or at home. Any substance use, including tobacco, alcohol, and drug use. Your home setting and your stress levels, including: Exposure to abuse or  violence. Household financial strain. Your daily health habits, including diet and exercise. Tests and screenings Your health care provider will: Measure your weight, height, and blood pressure. Do a physical exam, including a pelvic and breast exam. Perform blood tests and urine tests to check for: Urinary tract infection. Sexually transmitted infections (STIs). Low iron levels in your blood (anemia). Blood type and certain proteins on red blood cells (Rh antibodies). Infections and immunity to viruses, such as hepatitis B and rubella. HIV (human immunodeficiency virus). Discuss your options for genetic screening. Tips about staying healthy Your health care provider will also give you information about how to keep yourself and your baby healthy, including: Nutrition and taking vitamins. Physical activity. How to manage pregnancy symptoms such as nausea and vomiting (morning sickness). Infections and substances that may be harmful to your baby and how to avoid them. Food safety. Dental care. Working. Travel. Warning signs to watch for and when to call your health care provider. How often will I have prenatal care visits? After your first prenatal care visit, you will have regular visits throughout your pregnancy. The visit schedule is often as follows: Up to week 28 of pregnancy: once every 4 weeks. 28-36 weeks: once every 2 weeks. After 36 weeks: every week until delivery. Some women may have visits more or less often depending on any underlyinghealth conditions and the health of the baby. Keep all follow-up and prenatal care visits. This is important. What happens during routine prenatal care visits? Your health care provider will: Measure your weight and blood pressure. Check for fetal heart sounds. Measure the height of your uterus in your abdomen (fundal height). This may be measured starting around week 20 of pregnancy. Check the position of your baby inside your  uterus. Ask questions  about your diet, sleeping patterns, and whether you can feel the baby move. Review warning signs to watch for and signs of labor. Ask about any pregnancy symptoms you are having and how you are dealing with them. Symptoms may include: Headaches. Nausea and vomiting. Vaginal discharge. Swelling. Fatigue. Constipation. Changes in your vision. Feeling persistently sad or anxious. Any discomfort, including back or pelvic pain. Bleeding or spotting. Make a list of questions to ask your health care provider at your routinevisits. What tests might I have during prenatal care visits? You may have blood, urine, and imaging tests throughout your pregnancy, such as: Urine tests to check for glucose, protein, or signs of infection. Glucose tests to check for a form of diabetes that can develop during pregnancy (gestational diabetes mellitus). This is usually done around week 24 of pregnancy. Ultrasounds to check your baby's growth and development, to check for birth defects, and to check your baby's well-being. These can also help to decide when you should deliver your baby. A test to check for group B strep (GBS) infection. This is usually done around week 36 of pregnancy. Genetic testing. This may include blood, fluid, or tissue sampling, or imaging tests, such as an ultrasound. Some genetic tests are done during the first trimester and some are done during the second trimester. What else can I expect during prenatal care visits? Your health care provider may recommend getting certain vaccines during pregnancy. These may include: A yearly flu shot (annual influenza vaccine). This is especially important if you will be pregnant during flu season. Tdap (tetanus, diphtheria, pertussis) vaccine. Getting this vaccine during pregnancy can protect your baby from whooping cough (pertussis) after birth. This vaccine may be recommended between weeks 27 and 36 of pregnancy. A COVID-19  vaccine. Later in your pregnancy, your health care provider may give you information about: Childbirth and breastfeeding classes. Choosing a health care provider for your baby. Umbilical cord banking. Breastfeeding. Birth control after your baby is born. The hospital labor and delivery unit and how to set up a tour. Registering at the hospital before you go into labor. Where to find more information Office on Women's Health: LegalWarrants.gl American Pregnancy Association: americanpregnancy.org March of Dimes: marchofdimes.org Summary Prenatal care helps you and your baby stay as healthy as possible during pregnancy. Your first prenatal care visit will most likely be the longest. You will have visits and tests throughout your pregnancy to monitor your health and your baby's health. Bring a list of questions to your visits to ask your health care provider. Make sure to keep all follow-up and prenatal care visits. This information is not intended to replace advice given to you by your health care provider. Make sure you discuss any questions you have with your healthcare provider. Document Revised: 07/22/2020 Document Reviewed: 07/22/2020 Elsevier Patient Education  [place].

## 2021-04-05 NOTE — Progress Notes (Signed)
NEW OB HISTORY AND PHYSICAL  SUBJECTIVE:       Mackenzie Crawford is a 35 y.o. G12P1001 female, Patient's last menstrual period was 01/10/2021 (exact date)., Estimated Date of Delivery: 10/17/21, [redacted]w[redacted]d, presents today for establishment of Prenatal Care. She has no unusual complaints.    Socail Married with one son Works FT from Conservation officer, historic buildings Exercise: walking [place] 3 times week Denies smoking, alcohol , and drug use   Gynecologic History Patient's last menstrual period was 01/10/2021 (exact date). Normal Contraception: none Last Pap: 11/09/2020. 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     No current facility-administered medications on file prior to visit.    No Known Allergies  Social History   Socioeconomic History   Marital status: Married    Spouse name: Not on file   Number of children: Not on file   Years of education: Not on file   Highest education level: Not on file  Occupational History   Not on file  Tobacco Use   Smoking status: Former    Pack years: 0.00    Types: Cigarettes    Quit date: 11/01/2009    Years since quitting: 11.4   Smokeless tobacco: Never  Vaping Use   Vaping Use: Never used   Substance and Sexual Activity   Alcohol use: Not Currently    Alcohol/week: 2.0 standard drinks    Types: 2 Standard drinks or equivalent per week   Drug use: Not Currently   Sexual activity: Yes    Birth control/protection: None  Other Topics Concern   Not on file  Social History Narrative   Lives in [place].   From [place].      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, and Air cabin crew      Diet - limited gluten, healthy      Exercise - 3x per week   Social Determinants of Radio broadcast assistant Strain: Not on file  Food Insecurity: Not on file  Transportation Needs: Not on file  Physical Activity: Not on file  Stress: Not on file  Social Connections: Not on file  Intimate Partner Violence: Not on file    Family History  Problem Relation Age of Onset   Kidney Stones Father    Kidney Stones Brother    Cancer Maternal Grandmother        pancreatic   Heart disease Maternal Grandfather 67   Arthritis Paternal Grandmother    Diabetes Paternal  Grandmother    Breast cancer Mother     The following portions of the patient's history were reviewed and updated as appropriate: allergies, current medications, past OB history, past medical history, past surgical history, past family history, past social history, and problem list.    OBJECTIVE: Initial Physical Exam (New OB)  GENERAL APPEARANCE: alert, well appearing, in no apparent distress, oriented to person, place and time HEAD: normocephalic, atraumatic MOUTH: mucous membranes moist, pharynx normal without lesions THYROID: no thyromegaly or masses present BREASTS: no masses noted, no significant tenderness, no palpable axillary nodes, no skin changes LUNGS: clear to auscultation, no wheezes, rales or rhonchi, symmetric air entry HEART: regular rate and rhythm, no murmurs ABDOMEN: soft, nontender, nondistended, no abnormal masses, no epigastric pain and FHT present EXTREMITIES: no redness or  tenderness in the calves or thighs, no edema, no limitation in range of motion, intact peripheral pulses SKIN: normal coloration and turgor, no rashes LYMPH NODES: no adenopathy palpable NEUROLOGIC: alert, oriented, normal speech, no focal findings or movement disorder noted  PELVIC EXAM deferred , pap not due, tested pelvis  ASSESSMENT: Normal pregnancy  PLAN: New OB counseling: The patient has been given an overview regarding routine prenatal care. Recommendations regarding diet, weight gain, and exercise in pregnancy were given. Prenatal testing, optional genetic testing, carrier screening,and ultrasound use in pregnancy were reviewed. Natara collected today. Benefits of Breast Feeding were discussed. The patient is encouraged to consider nursing her baby post partum. ROB 4 wks.   Philip Aspen, CNM

## 2021-04-06 LAB — CBC WITH DIFFERENTIAL/PLATELET
Basophils Absolute: 0 10*3/uL (ref 0.0–0.2)
Basos: 1 %
EOS (ABSOLUTE): 0.1 10*3/uL (ref 0.0–0.4)
Eos: 2 %
Hematocrit: 36.1 % (ref 34.0–46.6)
Hemoglobin: 11.6 g/dL (ref 11.1–15.9)
Immature Grans (Abs): 0 10*3/uL (ref 0.0–0.1)
Immature Granulocytes: 0 %
Lymphocytes Absolute: 1.7 10*3/uL (ref 0.7–3.1)
Lymphs: 25 %
MCH: 27.1 pg (ref 26.6–33.0)
MCHC: 32.1 g/dL (ref 31.5–35.7)
MCV: 84 fL (ref 79–97)
Monocytes Absolute: 0.5 10*3/uL (ref 0.1–0.9)
Monocytes: 7 %
Neutrophils Absolute: 4.5 10*3/uL (ref 1.4–7.0)
Neutrophils: 65 %
Platelets: 235 10*3/uL (ref 150–450)
RBC: 4.28 x10E6/uL (ref 3.77–5.28)
RDW: 14.5 % (ref 11.7–15.4)
WBC: 6.8 10*3/uL (ref 3.4–10.8)

## 2021-05-03 ENCOUNTER — Encounter: Payer: Self-pay | Admitting: Certified Nurse Midwife

## 2021-05-03 ENCOUNTER — Other Ambulatory Visit: Payer: Self-pay

## 2021-05-03 ENCOUNTER — Ambulatory Visit (INDEPENDENT_AMBULATORY_CARE_PROVIDER_SITE_OTHER): Payer: Commercial Managed Care - PPO | Admitting: Certified Nurse Midwife

## 2021-05-03 VITALS — BP 126/78 | HR 92 | Wt 164.6 lb

## 2021-05-03 DIAGNOSIS — Z3A16 16 weeks gestation of pregnancy: Secondary | ICD-10-CM

## 2021-05-03 DIAGNOSIS — R102 Pelvic and perineal pain: Secondary | ICD-10-CM

## 2021-05-03 DIAGNOSIS — O26899 Other specified pregnancy related conditions, unspecified trimester: Secondary | ICD-10-CM

## 2021-05-03 DIAGNOSIS — M5430 Sciatica, unspecified side: Secondary | ICD-10-CM

## 2021-05-03 DIAGNOSIS — Z3482 Encounter for supervision of other normal pregnancy, second trimester: Secondary | ICD-10-CM

## 2021-05-03 LAB — POCT URINALYSIS DIPSTICK OB
Bilirubin, UA: NEGATIVE
Blood, UA: NEGATIVE
Glucose, UA: NEGATIVE
Ketones, UA: NEGATIVE
Leukocytes, UA: NEGATIVE
Nitrite, UA: NEGATIVE
Spec Grav, UA: 1.01 (ref 1.010–1.025)
Urobilinogen, UA: 0.2 U/dL
pH, UA: 7 (ref 5.0–8.0)

## 2021-05-03 NOTE — Telephone Encounter (Signed)
Referral has been placed. 

## 2021-05-03 NOTE — Progress Notes (Signed)
ROB doing well feels occasional flutters. Has anatomy u/s scheduled on 7/22. Discussed round ligament pain. She c/o sciatic pain and pelvic pain. She is requesting referral for PT. Orders placed. Follow up 4 wks for rob or prn.   Philip Aspen, CNM

## 2021-05-03 NOTE — Patient Instructions (Signed)
Round Ligament Pain  The round ligament is a cord of muscle and tissue that helps support the uterus. It can become a source of pain during pregnancy if it becomes stretched or twisted as the baby grows. The pain usually begins in the second trimester (13-28 weeks) of pregnancy, and it can come and go until the baby is delivered.It is not a serious problem, and it does not cause harm to the baby. Round ligament pain is usually a short, sharp, and pinching pain, but it can also be a dull, lingering, and aching pain. The pain is felt in the lower side of the abdomen or in the groin. It usually starts deep in the groin and moves up to the outside of the hip area. The pain may occur when you: Suddenly change position, such as quickly going from a sitting to standing position. Roll over in bed. Cough or sneeze. Do physical activity. Follow these instructions at home:  Watch your condition for any changes. When the pain starts, relax. Then try any of these methods to help with the pain: Sitting down. Flexing your knees up to your abdomen. Lying on your side with one pillow under your abdomen and another pillow between your legs. Sitting in a warm bath for 15-20 minutes or until the pain goes away. Take over-the-counter and prescription medicines only as told by your health care provider. Move slowly when you sit down or stand up. Avoid long walks if they cause pain. Stop or reduce your physical activities if they cause pain. Keep all follow-up visits as told by your health care provider. This is important. Contact a health care provider if: Your pain does not go away with treatment. You feel pain in your back that you did not have before. Your medicine is not helping. Get help right away if: You have a fever or chills. You develop uterine contractions. You have vaginal bleeding. You have nausea or vomiting. You have diarrhea. You have pain when you urinate. Summary Round ligament pain is  felt in the lower abdomen or groin. It is usually a short, sharp, and pinching pain. It can also be a dull, lingering, and aching pain. This pain usually begins in the second trimester (13-28 weeks). It occurs because the uterus is stretching with the growing baby, and it is not harmful to the baby. You may notice the pain when you suddenly change position, when you cough or sneeze, or during physical activity. Relaxing, flexing your knees to your abdomen, lying on one side, or taking a warm bath may help to get rid of the pain. Get help from your health care provider if the pain does not go away or if you have vaginal bleeding, nausea, vomiting, diarrhea, or painful urination. This information is not intended to replace advice given to you by your health care provider. Make sure you discuss any questions you have with your healthcare provider. Document Revised: 09/24/2020 Document Reviewed: 03/27/2018 Elsevier Patient Education  [place].

## 2021-05-13 ENCOUNTER — Ambulatory Visit
Admission: RE | Admit: 2021-05-13 | Discharge: 2021-05-13 | Disposition: A | Discharge status: Home / Self Care | Payer: Commercial Managed Care - PPO | Source: Ambulatory Visit | Attending: Certified Nurse Midwife | Admitting: Certified Nurse Midwife

## 2021-05-13 ENCOUNTER — Other Ambulatory Visit: Payer: Self-pay

## 2021-05-13 DIAGNOSIS — Z3482 Encounter for supervision of other normal pregnancy, second trimester: Secondary | ICD-10-CM | POA: Diagnosis present

## 2021-05-14 ENCOUNTER — Other Ambulatory Visit: Payer: Self-pay | Admitting: Certified Nurse Midwife

## 2021-05-14 DIAGNOSIS — Z3A20 20 weeks gestation of pregnancy: Secondary | ICD-10-CM

## 2021-05-17 ENCOUNTER — Other Ambulatory Visit: Payer: Self-pay | Admitting: Certified Nurse Midwife

## 2021-05-17 DIAGNOSIS — O26892 Other specified pregnancy related conditions, second trimester: Secondary | ICD-10-CM

## 2021-05-30 ENCOUNTER — Ambulatory Visit
Admission: RE | Admit: 2021-05-30 | Discharge: 2021-05-30 | Disposition: A | Discharge status: Home / Self Care | Payer: Commercial Managed Care - PPO | Source: Ambulatory Visit | Attending: Certified Nurse Midwife | Admitting: Certified Nurse Midwife

## 2021-05-30 ENCOUNTER — Other Ambulatory Visit: Payer: Self-pay

## 2021-05-30 DIAGNOSIS — Z362 Encounter for other antenatal screening follow-up: Secondary | ICD-10-CM | POA: Diagnosis present

## 2021-05-30 DIAGNOSIS — Z3A20 20 weeks gestation of pregnancy: Secondary | ICD-10-CM | POA: Insufficient documentation

## 2021-05-30 DIAGNOSIS — Z3402 Encounter for supervision of normal first pregnancy, second trimester: Secondary | ICD-10-CM | POA: Diagnosis not present

## 2021-05-31 ENCOUNTER — Ambulatory Visit (INDEPENDENT_AMBULATORY_CARE_PROVIDER_SITE_OTHER): Payer: Commercial Managed Care - PPO | Admitting: Certified Nurse Midwife

## 2021-05-31 ENCOUNTER — Encounter: Payer: Self-pay | Admitting: Certified Nurse Midwife

## 2021-05-31 VITALS — BP 124/74 | HR 78 | Wt 165.0 lb

## 2021-05-31 DIAGNOSIS — Z3A20 20 weeks gestation of pregnancy: Secondary | ICD-10-CM

## 2021-05-31 DIAGNOSIS — Z3482 Encounter for supervision of other normal pregnancy, second trimester: Secondary | ICD-10-CM

## 2021-05-31 LAB — POCT URINALYSIS DIPSTICK OB
Bilirubin, UA: NEGATIVE
Blood, UA: NEGATIVE
Glucose, UA: NEGATIVE
Ketones, UA: NEGATIVE
Leukocytes, UA: NEGATIVE
Nitrite, UA: NEGATIVE
POC,PROTEIN,UA: NEGATIVE
Spec Grav, UA: 1.015 (ref 1.010–1.025)
Urobilinogen, UA: 0.2 E.U./dL
pH, UA: 7.5 (ref 5.0–8.0)

## 2021-05-31 NOTE — Patient Instructions (Signed)
Round Ligament Pain  The round ligament is a cord of muscle and tissue that helps support the uterus. It can become a source of pain during pregnancy if it becomes stretched or twisted as the baby grows. The pain usually begins in the second trimester (13-28 weeks) of pregnancy, and it can come and go until the baby is delivered.It is not a serious problem, and it does not cause harm to the baby. Round ligament pain is usually a short, sharp, and pinching pain, but it can also be a dull, lingering, and aching pain. The pain is felt in the lower side of the abdomen or in the groin. It usually starts deep in the groin and moves up to the outside of the hip area. The pain may occur when you: Suddenly change position, such as quickly going from a sitting to standing position. Roll over in bed. Cough or sneeze. Do physical activity. Follow these instructions at home:  Watch your condition for any changes. When the pain starts, relax. Then try any of these methods to help with the pain: Sitting down. Flexing your knees up to your abdomen. Lying on your side with one pillow under your abdomen and another pillow between your legs. Sitting in a warm bath for 15-20 minutes or until the pain goes away. Take over-the-counter and prescription medicines only as told by your health care provider. Move slowly when you sit down or stand up. Avoid long walks if they cause pain. Stop or reduce your physical activities if they cause pain. Keep all follow-up visits as told by your health care provider. This is important. Contact a health care provider if: Your pain does not go away with treatment. You feel pain in your back that you did not have before. Your medicine is not helping. Get help right away if: You have a fever or chills. You develop uterine contractions. You have vaginal bleeding. You have nausea or vomiting. You have diarrhea. You have pain when you urinate. Summary Round ligament pain is  felt in the lower abdomen or groin. It is usually a short, sharp, and pinching pain. It can also be a dull, lingering, and aching pain. This pain usually begins in the second trimester (13-28 weeks). It occurs because the uterus is stretching with the growing baby, and it is not harmful to the baby. You may notice the pain when you suddenly change position, when you cough or sneeze, or during physical activity. Relaxing, flexing your knees to your abdomen, lying on one side, or taking a warm bath may help to get rid of the pain. Get help from your health care provider if the pain does not go away or if you have vaginal bleeding, nausea, vomiting, diarrhea, or painful urination. This information is not intended to replace advice given to you by your health care provider. Make sure you discuss any questions you have with your healthcare provider. Document Revised: 09/24/2020 Document Reviewed: 03/27/2018 Elsevier Patient Education  [place].

## 2021-05-31 NOTE — Progress Notes (Signed)
ROB doing well, feels good fetal movement. Pt had rpt u/s yesterday for completion of anatomy. Results are not available at this time. Will follow up. Reviewed round ligament pain and self help measures. She verbalizes and agrees. Follow up 4 wk or prn.   Philip Aspen, CNM

## 2021-06-20 ENCOUNTER — Other Ambulatory Visit: Payer: Self-pay

## 2021-06-20 ENCOUNTER — Encounter: Payer: Self-pay | Admitting: Obstetrics and Gynecology

## 2021-06-20 ENCOUNTER — Observation Stay
Admission: EM | Admit: 2021-06-20 | Discharge: 2021-06-20 | Disposition: A | Discharge status: Home / Self Care | Payer: Commercial Managed Care - PPO | Attending: Certified Nurse Midwife | Admitting: Certified Nurse Midwife

## 2021-06-20 DIAGNOSIS — O368120 Decreased fetal movements, second trimester, not applicable or unspecified: Secondary | ICD-10-CM | POA: Diagnosis not present

## 2021-06-20 DIAGNOSIS — R509 Fever, unspecified: Secondary | ICD-10-CM | POA: Diagnosis not present

## 2021-06-20 DIAGNOSIS — Z3A23 23 weeks gestation of pregnancy: Secondary | ICD-10-CM | POA: Diagnosis not present

## 2021-06-20 DIAGNOSIS — O368320 Maternal care for abnormalities of the fetal heart rate or rhythm, second trimester, not applicable or unspecified: Secondary | ICD-10-CM | POA: Diagnosis not present

## 2021-06-20 DIAGNOSIS — O26892 Other specified pregnancy related conditions, second trimester: Secondary | ICD-10-CM | POA: Diagnosis present

## 2021-06-20 NOTE — OB Triage Note (Signed)
L&D OB Triage Note  Mackenzie Crawford is a 35 y.o. G2P1001 female at [redacted]w[redacted]d EDD Estimated Date of Delivery: 10/17/21 who presented to triage for complaints of fever.PT states she had a 101.9 temperature at home and decreased fetal movement.  She was evaluated by the nurses with no significant findings.Her temperature was normal on arrival after taking tylenol at home. She denies exposure to anyone with illness. She is now feeling good fetal movement, denies LOF, vaginal bleeding and contractions. Vital signs stable. An fetal heart tracing was performed and has been reviewed by Me.    NST INTERPRETATION: Indications: decreased fetal movement, patient reassurance  Mode: External Baseline Rate (A): 135 bpm Variability: Moderate Accelerations: 15 x 15 Decelerations: None     Contraction Frequency (min): None Noted  Impression: reactive, appropriate for gestational age.   Plan: NST performed was reviewed and was found to be reactive. Discussed red flag symptoms . Encouraged continued tylenol as need should fever reoccur. Encouraged hydration and rest. She was discharged home with COVID/Flu precautions.  Continue routine prenatal care. Follow up with OB/GYN as previously scheduled.     APhilip Aspen CNM

## 2021-06-20 NOTE — OB Triage Note (Signed)
Pt. presents to Labor & Delivery for decreased fetal movement at [redacted]w[redacted]d Pt.'s additional symptoms include reported fever of 101.9 (resolved with 1,'000mg'$  of tylenol) and chills (no longer present); all of which began today. Pt. told to come to the hospital by the after-hours triage nurse for her OB practice. Pt. denies LOF, vaginal bleeding, and contractions. External [place] applied. Initial FHR 121bpm. All vital signs WNL. Will continue to assess.

## 2021-06-28 ENCOUNTER — Encounter: Payer: Commercial Managed Care - PPO | Admitting: Certified Nurse Midwife

## 2021-07-05 ENCOUNTER — Ambulatory Visit (INDEPENDENT_AMBULATORY_CARE_PROVIDER_SITE_OTHER): Payer: Commercial Managed Care - PPO | Admitting: Certified Nurse Midwife

## 2021-07-05 ENCOUNTER — Encounter: Payer: Self-pay | Admitting: Certified Nurse Midwife

## 2021-07-05 ENCOUNTER — Other Ambulatory Visit: Payer: Self-pay

## 2021-07-05 VITALS — BP 116/68 | HR 76 | Wt 167.1 lb

## 2021-07-05 DIAGNOSIS — Z131 Encounter for screening for diabetes mellitus: Secondary | ICD-10-CM

## 2021-07-05 DIAGNOSIS — Z3A25 25 weeks gestation of pregnancy: Secondary | ICD-10-CM

## 2021-07-05 DIAGNOSIS — Z130 Encounter for screening for diseases of the blood and blood-forming organs and certain disorders involving the immune mechanism: Secondary | ICD-10-CM

## 2021-07-05 DIAGNOSIS — Z3482 Encounter for supervision of other normal pregnancy, second trimester: Secondary | ICD-10-CM

## 2021-07-05 LAB — POCT URINALYSIS DIPSTICK OB
Blood, UA: NEGATIVE
Glucose, UA: NEGATIVE
Ketones, UA: NEGATIVE
Nitrite, UA: NEGATIVE
Odor: NEGATIVE
Spec Grav, UA: 1.02 (ref 1.010–1.025)
Urobilinogen, UA: 0.2 E.U./dL
pH, UA: 6.5 (ref 5.0–8.0)

## 2021-07-05 NOTE — Progress Notes (Signed)
rp

## 2021-07-05 NOTE — Progress Notes (Signed)
ROB- Declines flu vaccine. Cramps on right side.

## 2021-07-05 NOTE — Patient Instructions (Signed)
Oral Glucose Tolerance Test During Pregnancy Why am I having this test? The oral glucose tolerance test (OGTT) is done to check how your body processes blood sugar (glucose). This is one of several tests used to diagnose diabetes that develops during pregnancy (gestational diabetes mellitus). Gestational diabetes is a short-term form of diabetes that some women develop while they are pregnant. It usually occurs during the second trimester of pregnancy and goes away after delivery. Testing, or screening, for gestational diabetes usually occurs at weeks 24-28 of pregnancy. You may have the OGTT test after having a 1-hour glucose screening test if the results from that test indicate that you may have gestational diabetes. This test may also be needed if: You have a history of gestational diabetes. There is a history of giving birth to very large babies or of losing pregnancies (having stillbirths). You have signs and symptoms of diabetes, such as: Changes in your eyesight. Tingling or numbness in your hands or feet. Changes in hunger, thirst, and urination, and these are not explained by your pregnancy. What is being tested? This test measures the amount of glucose in your blood at different times during a period of 3 hours. This shows how well your body can process glucose. What kind of sample is taken? Blood samples are required for this test. They are usually collected by inserting a needle into a blood vessel. How do I prepare for this test? For 3 days before your test, eat normally. Have plenty of carbohydrate-rich foods. Follow instructions from your health care provider about: Eating or drinking restrictions on the day of the test. You may be asked not to eat or drink anything other than water (to fast) starting 8-10 hours before the test. Changing or stopping your regular medicines. Some medicines may interfere with this test. Tell a health care provider about: All medicines you are taking,  including vitamins, herbs, eye drops, creams, and over-the-counter medicines. Any blood disorders you have. Any surgeries you have had. Any medical conditions you have. What happens during the test? First, your blood glucose will be measured. This is referred to as your fasting blood glucose because you fasted before the test. Then, you will drink a glucose solution that contains a certain amount of glucose. Your blood glucose will be measured again 1, 2, and 3 hours after you drink the solution. This test takes about 3 hours to complete. You will need to stay at the testing location during this time. During the testing period: Do not eat or drink anything other than the glucose solution. Do not exercise. Do not use any products that contain nicotine or tobacco, such as cigarettes, e-cigarettes, and chewing tobacco. These can affect your test results. If you need help quitting, ask your health care provider. The testing procedure may vary among health care providers and hospitals. How are the results reported? Your results will be reported as milligrams of glucose per deciliter of blood (mg/dL) or millimoles per liter (mmol/L). There is more than one source for screening and diagnosis reference values used to diagnose gestational diabetes. Your health care provider will compare your results to normal values that were established after testing a large group of people (reference values). Reference values may vary among labs and hospitals. For this test ([place]), reference values are: Fasting: 95 mg/dL (5.3 mmol/L). 1 hour: 180 mg/dL (10.0 mmol/L). 2 hour: 155 mg/dL (8.6 mmol/L). 3 hour: 140 mg/dL (7.8 mmol/L). What do the results mean? Results below the reference values are considered  normal. If two or more of your blood glucose levels are at or above the reference values, you may be diagnosed with gestational diabetes. If only one level is high, your health care provider may suggest  repeat testing or other tests to confirm a diagnosis. Talk with your health care provider about what your results mean. Questions to ask your health care provider Ask your health care provider, or the department that is doing the test: When will my results be ready? How will I get my results? What are my treatment options? What other tests do I need? What are my next steps? Summary The oral glucose tolerance test (OGTT) is one of several tests used to diagnose diabetes that develops during pregnancy (gestational diabetes mellitus). Gestational diabetes is a short-term form of diabetes that some women develop while they are pregnant. You may have the OGTT test after having a 1-hour glucose screening test if the results from that test show that you may have gestational diabetes. You may also have this test if you have any symptoms or risk factors for this type of diabetes. Talk with your health care provider about what your results mean. This information is not intended to replace advice given to you by your health care provider. Make sure you discuss any questions you have with your health care provider. Document Revised: 03/18/2020 Document Reviewed: 03/18/2020 Elsevier Patient Education  2022 [employer].

## 2021-07-05 NOTE — Progress Notes (Signed)
ROB doing well, feeling fetal movement. Discussed 28 wk labs . Information of glucose screening given. All questions answered. Follow up 3 wk for ROB or prn.   Philip Aspen, CNM

## 2021-07-14 ENCOUNTER — Other Ambulatory Visit: Payer: Self-pay

## 2021-07-14 ENCOUNTER — Encounter: Payer: Commercial Managed Care - PPO | Attending: Family | Admitting: Physical Therapy

## 2021-07-14 ENCOUNTER — Encounter: Payer: Self-pay | Admitting: Physical Therapy

## 2021-07-14 DIAGNOSIS — M545 Low back pain, unspecified: Secondary | ICD-10-CM | POA: Diagnosis present

## 2021-07-14 DIAGNOSIS — R278 Other lack of coordination: Secondary | ICD-10-CM | POA: Diagnosis present

## 2021-07-14 DIAGNOSIS — M6281 Muscle weakness (generalized): Secondary | ICD-10-CM | POA: Insufficient documentation

## 2021-07-14 NOTE — Patient Instructions (Signed)
Access Code: [email or web address] URL: [email or web address] Date: 07/14/2021 Prepared by: Earlie Counts  Exercises Seated Pelvic Floor Contraction - 4 x daily - 7 x weekly - 1 sets - 10 reps - 5 sec hold Half Kneeling Hip Flexor Stretch with Sidebend - 1 x daily - 7 x weekly - 1 sets - 2 reps - 30 sec hold Supine Piriformis Stretch with Leg Straight - 1 x daily - 7 x weekly - 1 sets - 2 reps - 30 sec hold Seated Piriformis Stretch with Trunk Bend - 1 x daily - 7 x weekly - 1 sets - 2 reps - 30 sec hold  Earlie Counts, PT [facility] [address], [place],  [address] W: [phone] [email or web address]

## 2021-07-15 NOTE — Therapy (Signed)
[place] at [facility] [address] [place], [place], [address] Phone: [phone]   Fax:  [phone]  Physical Therapy Evaluation  Patient Details  Name: Mackenzie Crawford MRN: [id number] Date of Birth: May 22, 1986 Referring Provider (PT): Philip Aspen CNM   Encounter Date: 07/14/2021   PT End of Session - 07/14/21 1645     Visit Number 1    Date for PT Re-Evaluation 10/06/21    Authorization Type UHC    PT Start Time 1600    PT Stop Time 1423    PT Time Calculation (min) 65 min    Activity Tolerance Patient tolerated treatment well;No increased pain    Behavior During Therapy WFL for tasks assessed/performed             Past Medical History:  Diagnosis Date   Anxiety    Arthritis    knees, 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    Subjective Assessment - 07/14/21 1603     Subjective AFter first pregnanacy felt like her insides were falling out. Patient is on her second pregnancy and wants to prepare her body and understand her recovery process. Right now leak urine with coughing, sneezing. Hip pain and sciatic pain.    Patient Stated Goals education, reduce chance of pelvic floor pain, and how to strengthen pelvic floor    Currently in Pain? Yes    Pain Score 2     Pain Location Hip    Pain Orientation Left    Pain Descriptors / Indicators Aching    Pain Type Acute pain    Pain Onset More than a month ago    Pain Frequency Constant    Aggravating Factors  up and down stairs    Multiple Pain Sites Yes    Pain Score 5    Pain Location Buttocks    Pain Orientation Left    Pain Descriptors / Indicators Shooting    Pain Type Acute pain    Pain Radiating Towards refer down to left leg     Pain Onset More than a month ago    Pain Frequency Intermittent    Aggravating Factors  pain is random    Pain Relieving Factors stretch, lay on flor anddo piriformis, forward bend stretch, massage ball to sit on, sit on a yoga ball chair                            Objective measurements completed on examination: See above findings.                PT Education - 07/14/21 1714     Education Details Access Code: [email or web address]    Person(s) Educated Patient    Methods Explanation;Demonstration;Verbal cues;Handout    Comprehension Returned demonstration;Verbalized understanding              PT Short Term Goals - 07/15/21 0841       PT SHORT TERM GOAL #1   Title independent with initial HEP for flexibility and pelvic floor contraction    Time 4    Period Weeks    Status New    Target Date 08/12/21  PT SHORT TERM GOAL #2   Title pelvis in correct alignment for 4 weeks due to correction of muscle imbalances    Time 4    Period Weeks    Status New    Target Date 08/12/21      PT SHORT TERM GOAL #3   Title pain in left hip reduced >/= 25% due to reduction of trigger points and pelvis in correct alignment    Time 4    Period Weeks    Status New    Target Date 08/12/21               PT Long Term Goals - 07/14/21 0843       PT LONG TERM GOAL #1   Title Independent with postnatal care for the pelvic floor to reduce urinary leakage, falling out feeling, and reduce discomfort    Time 12    Period Weeks    Status New    Target Date 10/06/21      PT LONG TERM GOAL #2   Title understand ways to reduce urinary leakage with coughing and sneezing with full circular contraction of the pelvic floor holding for 10 seconds    Time 12    Period Weeks    Status New    Target Date 10/06/21      PT LONG TERM GOAL #3   Title pain in left hip and buttocks decreased >/= 50% due to reduction on trigger points in the pelvic floor and hip muscles     Time 12    Period Weeks    Status New    Target Date 10/06/21      PT LONG TERM GOAL #4   Title understand ways to strengthen the pelvic floor correctly after birth to reduce the falling out feeling she had after the first pregnancy    Time 12    Period Weeks    Status New    Target Date 10/06/21                    Plan - 07/14/21 1714     Clinical Impression Statement Patient is a 35 year old female with pelvic pain, left hip pain and sometimes has sciatica. Patient reports her left hip pain is 2/10 and left buttocks pain is 5/10. She has increased pain with walking, steps, and standing. She feels better if she uses a ball to massage the buttocks. Patient has decreased bilateral hip external rotation. She has weakness in bilateral hip abduction. Her pelvic floor strength is 25 but after manual work was 3/5 with circular contraction and lift. Patient has tenderness located on the pubic bone, bilateral piriformis, left ischicoccygeu, left superficial transverse perineum, and ischiocavernosus. Patient left ilium is rotated posteriorly on the left, sacrum rotated right, and decreased mobility of L1-L5. Patient had a falling out feeling after her first pregnancy and is nervous of having that feeling again and wants to be proactive of what she can do for her pelvic floor prior and after this pregnancy. Patient reports she will leak urine with coughing and sneezing and this was not happening prior to the pregnancy. Patient will benefit from skilled therapy to improve pelvic floor coordination and strength, education on pre and postpartum care of the pelvic floor and ways to give birth to not strain the pelvic floor.    Personal Factors and Comorbidities Fitness;Comorbidity 1    Comorbidities presently pregnant and due on 10/17/2021    Examination-Activity Limitations Locomotion  Level;Stairs    Examination-Participation Restrictions Community Activity    Stability/Clinical Decision Making  Stable/Uncomplicated    Clinical Decision Making Low    Rehab Potential Excellent    PT Frequency 1x / week    PT Duration 12 weeks    PT Treatment/Interventions ADLs/Self Care Home Management;Cryotherapy;Moist Heat;Therapeutic activities;Therapeutic exercise;Neuromuscular re-education;Patient/family education;Manual techniques;Passive range of motion;Taping;Spinal Manipulations;Joint Manipulations    PT Next Visit Plan correct pelvis; toileting; prenatal exercises for pushing but also stabilize the pelvis; 360 degree rib expansion; transverse abdominus contraction; what to do after birth, breath    PT Home Exercise Plan Access Code: [email or web address]    Consulted and Agree with Plan of Care Patient             Patient will benefit from skilled therapeutic intervention in order to improve the following deficits and impairments:  Decreased coordination, Decreased range of motion, Increased fascial restricitons, Decreased endurance, Decreased activity tolerance, Pain, Increased muscle spasms, Decreased strength  Visit Diagnosis: Muscle weakness (generalized) - Plan: PT plan of care cert/re-cert  Other lack of coordination - Plan: PT plan of care cert/re-cert  Left-sided low back pain without sciatica, unspecified chronicity - Plan: PT plan of care cert/re-cert     Problem List Patient Active Problem List   Diagnosis Date Noted   Labor and delivery, indication for care 06/20/2021   STD exposure 02/20/2018   Dysmenorrhea 12/21/2017   Benign nevus 11/16/2016   Panic disorder 06/19/2014    Earlie Counts, PT  [facility] [address], [place], [place], [address] Phone: [phone]   Fax:  [phone]  Name: Mackenzie Crawford MRN: [id number] Date of Birth: 08/17/1986

## 2021-07-27 ENCOUNTER — Other Ambulatory Visit: Payer: Commercial Managed Care - PPO

## 2021-07-27 ENCOUNTER — Other Ambulatory Visit: Payer: Self-pay

## 2021-07-27 ENCOUNTER — Ambulatory Visit (INDEPENDENT_AMBULATORY_CARE_PROVIDER_SITE_OTHER): Payer: Commercial Managed Care - PPO | Admitting: Certified Nurse Midwife

## 2021-07-27 DIAGNOSIS — Z3482 Encounter for supervision of other normal pregnancy, second trimester: Secondary | ICD-10-CM

## 2021-07-27 DIAGNOSIS — Z23 Encounter for immunization: Secondary | ICD-10-CM | POA: Diagnosis not present

## 2021-07-27 DIAGNOSIS — Z3A28 28 weeks gestation of pregnancy: Secondary | ICD-10-CM

## 2021-07-27 DIAGNOSIS — Z130 Encounter for screening for diseases of the blood and blood-forming organs and certain disorders involving the immune mechanism: Secondary | ICD-10-CM

## 2021-07-27 DIAGNOSIS — Z3483 Encounter for supervision of other normal pregnancy, third trimester: Secondary | ICD-10-CM

## 2021-07-27 LAB — POCT URINALYSIS DIPSTICK OB
Bilirubin, UA: NEGATIVE
Blood, UA: NEGATIVE
Glucose, UA: NEGATIVE
Ketones, UA: NEGATIVE
Leukocytes, UA: NEGATIVE
Nitrite, UA: NEGATIVE
Spec Grav, UA: 1.015 (ref 1.010–1.025)
Urobilinogen, UA: 0.2 E.U./dL
pH, UA: 7 (ref 5.0–8.0)

## 2021-07-27 MED ORDER — TETANUS-DIPHTH-ACELL PERTUSSIS 5-2.5-18.5 LF-MCG/0.5 IM SUSY
0.5000 mL | PREFILLED_SYRINGE | Freq: Once | INTRAMUSCULAR | Status: AC
  Administered 2021-07-27: 0.5 mL via INTRAMUSCULAR

## 2021-07-27 NOTE — Progress Notes (Signed)
ROB doing well. Feels good movement. 28 wk labs today: Glucose screen/RPR/CBC. Tdap, Blood transfusion consent completed, all questions answered. Ready set baby reviewed, see check list for topics covered. bIRTH PLAN DISCUSSED  Wants epidural ,. Discussed birth control after delivery. She declines BC states that she does not do well with it. Concerned about wt gain. Reasurrance given. Discussed doing u/s for growth if wt does gain does not improve over the next few visits.   Follow up 2 wk for ROB or sooner if needed.    Philip Aspen, CNM

## 2021-07-28 LAB — CBC
Hematocrit: 34.3 % (ref 34.0–46.6)
Hemoglobin: 11.3 g/dL (ref 11.1–15.9)
MCH: 28.9 pg (ref 26.6–33.0)
MCHC: 32.9 g/dL (ref 31.5–35.7)
MCV: 88 fL (ref 79–97)
Platelets: 203 10*3/uL (ref 150–450)
RBC: 3.91 x10E6/uL (ref 3.77–5.28)
RDW: 13.4 % (ref 11.7–15.4)
WBC: 7.4 10*3/uL (ref 3.4–10.8)

## 2021-07-28 LAB — GLUCOSE, 1 HOUR GESTATIONAL: Gestational Diabetes Screen: 119 mg/dL (ref 65–139)

## 2021-07-28 LAB — RPR: RPR Ser Ql: NONREACTIVE

## 2021-08-10 ENCOUNTER — Other Ambulatory Visit: Payer: Self-pay

## 2021-08-10 ENCOUNTER — Ambulatory Visit: Payer: Commercial Managed Care - PPO | Attending: Certified Nurse Midwife | Admitting: Physical Therapy

## 2021-08-10 ENCOUNTER — Encounter: Payer: Self-pay | Admitting: Physical Therapy

## 2021-08-10 DIAGNOSIS — O2692 Pregnancy related conditions, unspecified, second trimester: Secondary | ICD-10-CM | POA: Diagnosis not present

## 2021-08-10 DIAGNOSIS — R278 Other lack of coordination: Secondary | ICD-10-CM

## 2021-08-10 DIAGNOSIS — R102 Pelvic and perineal pain: Secondary | ICD-10-CM | POA: Insufficient documentation

## 2021-08-10 DIAGNOSIS — M545 Low back pain, unspecified: Secondary | ICD-10-CM

## 2021-08-10 DIAGNOSIS — M6281 Muscle weakness (generalized): Secondary | ICD-10-CM

## 2021-08-10 NOTE — Patient Instructions (Signed)
Access Code: [email or web address] URL: [email or web address] Date: 08/10/2021 Prepared by: Earlie Counts  Program Notes lay on right side put foam roll between knees and pull left thigh backwards 10x 3 times per week.     Exercises Seated Pelvic Floor Contraction - 4 x daily - 7 x weekly - 1 sets - 10 reps - 5 sec hold Half Kneeling Hip Flexor Stretch with Sidebend - 1 x daily - 7 x weekly - 1 sets - 2 reps - 30 sec hold Supine Piriformis Stretch with Leg Straight - 1 x daily - 7 x weekly - 1 sets - 2 reps - 30 sec hold Seated Piriformis Stretch with Trunk Bend - 1 x daily - 7 x weekly - 1 sets - 2 reps - 30 sec hold Supine Bridge with Resistance Band - 1 x daily - 3 x weekly - 1 sets - 10 reps Seated Hamstring Curl with Anchored Resistance - 1 x daily - 3 x weekly - 1 sets - 10 reps Seated Bilateral AROM Pectoralis Stretch - 1 x daily - 3 x weekly - 1 sets - 10 reps [facility] [address], [place], [place] [address] Phone # [phone] Fax [phone]

## 2021-08-10 NOTE — Therapy (Signed)
[facility] @ [place], [place], [address] Phone: [phone]   Fax:  [phone]  Physical Therapy Treatment  Patient Details  Name: Mackenzie Crawford MRN: [id number] Date of Birth: 02-11-1986 Referring Provider (PT): Philip Aspen CNM   Encounter Date: 08/10/2021   PT End of Session - 08/10/21 1317     Visit Number 2    Date for PT Re-Evaluation 10/06/21    Authorization Type UHC    PT Start Time 1230    PT Stop Time 1309    PT Time Calculation (min) 39 min    Activity Tolerance Patient tolerated treatment well;No increased pain    Behavior During Therapy WFL for tasks assessed/performed             Past Medical History:  Diagnosis Date   Anxiety    Arthritis    knees, 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   Subjective Assessment - 08/10/21 1237     Subjective I am still dealing with hip pain. It is better this week. I was sore after therapy.    Patient Stated Goals education, reduce chance of pelvic floor pain, and how to strengthen pelvic floor    Currently in Pain? Yes    Pain Score 8     Pain Location Hip    Pain Orientation Left    Pain Descriptors / Indicators Shooting;Sharp;Aching    Pain Type Acute pain    Pain Onset More than a month ago    Pain Frequency Intermittent    Aggravating Factors  step down, gettin up and of the ground    Pain Relieving Factors stretches, roller to massage, massage ball and hydrogun    Multiple Pain Sites No                [facility] PT Assessment - 08/10/21 0001       PROM   Right Hip External Rotation  --   after manual mobilization   Left Hip External Rotation  70      Palpation   SI assessment  left ilium  posteriorly rotated; sacrum rotated right                           [facility] Adult PT Treatment/Exercise - 08/10/21 0001       Lumbar Exercises: Stretches   Other Lumbar Stretch Exercise sitting pectoralis stretch 10 to open the chest.      Manual Therapy   Manual Therapy Soft tissue mobilization;Joint mobilization    Joint Mobilization to left hip with mulligan belt for inferior glide, posterior glide grade 3; gapping of the left L5 S1 facet in left sidely then stabilize to stretch the joint in sidely    Soft tissue mobilization manual work to left quadratus                     PT Education - 08/10/21 1316     Education Details Access Code: [email or web address]    Person(s) Educated Patient    Methods Explanation;Demonstration;Verbal cues;Handout    Comprehension Returned demonstration;Verbalized understanding  PT Short Term Goals - 08/10/21 1407       PT SHORT TERM GOAL #1   Title independent with initial HEP for flexibility and pelvic floor contraction    Time 4    Period Weeks    Status On-going    Target Date 08/12/21      PT SHORT TERM GOAL #2   Title pelvis in correct alignment for 4 weeks due to correction of muscle imbalances    Time 4    Period Weeks    Status On-going      PT SHORT TERM GOAL #3   Title pain in left hip reduced >/= 25% due to reduction of trigger points and pelvis in correct alignment    Time 4    Period Weeks    Status On-going               PT Long Term Goals - 07/14/21 0843       PT LONG TERM GOAL #1   Title Independent with postnatal care for the pelvic floor to reduce urinary leakage, falling out feeling, and reduce discomfort    Time 12    Period Weeks    Status New    Target Date 10/06/21      PT LONG TERM GOAL #2   Title understand ways to reduce urinary leakage with coughing and sneezing with full circular contraction of the pelvic floor holding for 10 seconds    Time 12    Period Weeks     Status New    Target Date 10/06/21      PT LONG TERM GOAL #3   Title pain in left hip and buttocks decreased >/= 50% due to reduction on trigger points in the pelvic floor and hip muscles    Time 12    Period Weeks    Status New    Target Date 10/06/21      PT LONG TERM GOAL #4   Title understand ways to strengthen the pelvic floor correctly after birth to reduce the falling out feeling she had after the first pregnancy    Time 12    Period Weeks    Status New    Target Date 10/06/21                   Plan - 08/10/21 1317     Clinical Impression Statement Patient is doing her hip stretches but still has some left hip pain. After manual work her ASIS was equal and left hip external rotation increased to 70 degrees. Patient has improved movement of L5-S1 after manual work. She is working on gluteal and left hamstring strength to stabilize the SI joint. Patient is now [redacted] weeks along in her pregnancy. Patient left hip pain is worse with stepping down and getting up from the floor. Patient will benefit from skilled therapy to improve pelvic floor coordination and strength, education on pre and pospartum care of the pelvic floor and ways to give birth to not strain the pelvic floor.    Personal Factors and Comorbidities Fitness;Comorbidity 1    Comorbidities presently pregnant and due on 10/17/2021    Examination-Activity Limitations Locomotion Level;Stairs    Examination-Participation Restrictions Community Activity    Stability/Clinical Decision Making Stable/Uncomplicated    Rehab Potential Excellent    PT Frequency 1x / week    PT Duration 12 weeks    PT Treatment/Interventions ADLs/Self Care Home Management;Cryotherapy;Moist Heat;Therapeutic activities;Therapeutic exercise;Neuromuscular re-education;Patient/family education;Manual techniques;Passive range of motion;Taping;Spinal  Manipulations;Joint Manipulations    PT Next Visit Plan toileting; prenatal exercises for  pushing but also stabilize the pelvis; 360 degree rib expansion; transverse abdominus contraction; what to do after birth, breath; tens unit placement during birht, go over massage gun; piriformis twist, sitting cat and camel, sitting forward and rotational movement, diaphragmatic breathing with pelvic floor contraction, sitting curl up with open back, sitting sidebend    PT Home Exercise Plan Access Code: [email or web address]    Recommended Other Services MD signed initial note    Consulted and Agree with Plan of Care Patient             Patient will benefit from skilled therapeutic intervention in order to improve the following deficits and impairments:  Decreased coordination, Decreased range of motion, Increased fascial restricitons, Decreased endurance, Decreased activity tolerance, Pain, Increased muscle spasms, Decreased strength  Visit Diagnosis: Muscle weakness (generalized)  Other lack of coordination  Left-sided low back pain without sciatica, unspecified chronicity     Problem List Patient Active Problem List   Diagnosis Date Noted   Labor and delivery, indication for care 06/20/2021   STD exposure 02/20/2018   Dysmenorrhea 12/21/2017   Benign nevus 11/16/2016   Panic disorder 06/19/2014    Earlie Counts, PT 08/10/21 2:09 PM  [facility] @ [place], [place], [address] Phone: [phone]   Fax:  [phone]  Name: Mackenzie Crawford MRN: [id number] Date of Birth: [birth year]

## 2021-08-12 ENCOUNTER — Other Ambulatory Visit: Payer: Self-pay

## 2021-08-12 ENCOUNTER — Ambulatory Visit (INDEPENDENT_AMBULATORY_CARE_PROVIDER_SITE_OTHER): Payer: Commercial Managed Care - PPO | Admitting: Certified Nurse Midwife

## 2021-08-12 VITALS — BP 120/76 | HR 76 | Wt 174.6 lb

## 2021-08-12 DIAGNOSIS — Z3A30 30 weeks gestation of pregnancy: Secondary | ICD-10-CM

## 2021-08-12 DIAGNOSIS — Z3483 Encounter for supervision of other normal pregnancy, third trimester: Secondary | ICD-10-CM

## 2021-08-12 LAB — POCT URINALYSIS DIPSTICK OB
Bilirubin, UA: NEGATIVE
Blood, UA: NEGATIVE
Glucose, UA: NEGATIVE
Ketones, UA: NEGATIVE
Leukocytes, UA: NEGATIVE
Nitrite, UA: NEGATIVE
Spec Grav, UA: 1.01 (ref 1.010–1.025)
Urobilinogen, UA: 0.2 E.U./dL
pH, UA: 7.5 (ref 5.0–8.0)

## 2021-08-12 NOTE — Patient Instructions (Signed)
Fetal Movement Counts Patient Name: ________________________________________________ Patient Due Date: ____________________ What is a fetal movement count? A fetal movement count is the number of times that you feel your baby move during a certain amount of time. This may also be called a fetal kick count. A fetal movement count is recommended for every pregnant woman. You may be asked to start counting fetal movements as early as week 28 of your pregnancy. Pay attention to when your baby is most active. You may notice your baby's sleep and wake cycles. You may also notice things that make your baby move more. You should do a fetal movement count: When your baby is normally most active. At the same time each day. A good time to count movements is while you are resting, after having something to eat and drink. How do I count fetal movements? Find a quiet, comfortable area. Sit, or lie down on your side. Write down the date, the start time and stop time, and the number of movements that you felt between those two times. Take this information with you to your health care visits. Write down your start time when you feel the first movement. Count kicks, flutters, swishes, rolls, and jabs. You should feel at least 10 movements. You may stop counting after you have felt 10 movements, or if you have been counting for 2 hours. Write down the stop time. If you do not feel 10 movements in 2 hours, contact your health care provider for further instructions. Your health care provider may want to do additional tests to assess your baby's well-being. Contact a health care provider if: You feel fewer than 10 movements in 2 hours. Your baby is not moving like he or she usually does. Date: ____________ Start time: ____________ Stop time: ____________ Movements: ____________ Date: ____________ Start time: ____________ Stop time: ____________ Movements: ____________ Date: ____________ Start time: ____________ Stop  time: ____________ Movements: ____________ Date: ____________ Start time: ____________ Stop time: ____________ Movements: ____________ Date: ____________ Start time: ____________ Stop time: ____________ Movements: ____________ Date: ____________ Start time: ____________ Stop time: ____________ Movements: ____________ Date: ____________ Start time: ____________ Stop time: ____________ Movements: ____________ Date: ____________ Start time: ____________ Stop time: ____________ Movements: ____________ Date: ____________ Start time: ____________ Stop time: ____________ Movements: ____________ This information is not intended to replace advice given to you by your health care provider. Make sure you discuss any questions you have with your health care provider. Document Revised: 05/29/2019 Document Reviewed: 05/29/2019 Elsevier Patient Education  [place].

## 2021-08-12 NOTE — Progress Notes (Signed)
ROB doing well, feeling regular movement. Is see PT for hip, back , and pelvic floor. She states that it is helping. Discussed u/s in pregnancy, asking if she will have any more. Discussed reasons for u/s use. She verbalizes understanding. Follow up 2 wk for rob.   Philip Aspen, CNM

## 2021-08-23 ENCOUNTER — Other Ambulatory Visit: Payer: Self-pay

## 2021-08-23 ENCOUNTER — Ambulatory Visit (INDEPENDENT_AMBULATORY_CARE_PROVIDER_SITE_OTHER): Payer: Commercial Managed Care - PPO | Admitting: Certified Nurse Midwife

## 2021-08-23 VITALS — BP 119/77 | HR 87 | Wt 175.2 lb

## 2021-08-23 DIAGNOSIS — Z3A32 32 weeks gestation of pregnancy: Secondary | ICD-10-CM

## 2021-08-23 DIAGNOSIS — Z6721 Type B blood, Rh negative: Secondary | ICD-10-CM | POA: Diagnosis not present

## 2021-08-23 DIAGNOSIS — Z3483 Encounter for supervision of other normal pregnancy, third trimester: Secondary | ICD-10-CM | POA: Diagnosis not present

## 2021-08-23 LAB — POCT URINALYSIS DIPSTICK OB
Bilirubin, UA: NEGATIVE
Blood, UA: NEGATIVE
Glucose, UA: NEGATIVE
Ketones, UA: NEGATIVE
Leukocytes, UA: NEGATIVE
Nitrite, UA: NEGATIVE
POC,PROTEIN,UA: NEGATIVE
Spec Grav, UA: 1.01
Urobilinogen, UA: 0.2 U/dL
pH, UA: 7.5

## 2021-08-23 MED ORDER — RHO D IMMUNE GLOBULIN 1500 UNITS IM SOSY
1500.0000 [IU] | PREFILLED_SYRINGE | Freq: Once | INTRAMUSCULAR | Status: AC
  Administered 2021-08-23: 1500 [IU] via INTRAMUSCULAR

## 2021-08-23 NOTE — Progress Notes (Signed)
ROB doing well, feeling good movement. Rhogam given today. Was not in stock at 28 wk visit. Discussed growth u/s should she measure off by 2 or more inches. She verbalizes and agrees. Discussed GBS testing @ 36 wks. Follow up 2 wks or prn.   Philip Aspen, CNM

## 2021-08-23 NOTE — Patient Instructions (Signed)
[place] Pediatrician List  [facility]  [address], [place], [place] [address]  Phone: [phone]  [facility] (second location)  [address] [place], [place] [address]  Phone: [phone]  [facility] ([place]) [address], [place], [place] [address] Phone: [phone]  [facility]  [address] [place],  [address]  Phone: [phone] 

## 2021-08-26 ENCOUNTER — Encounter: Payer: Self-pay | Admitting: Physical Therapy

## 2021-08-26 ENCOUNTER — Other Ambulatory Visit: Payer: Self-pay

## 2021-08-26 ENCOUNTER — Ambulatory Visit: Payer: Commercial Managed Care - PPO | Attending: Certified Nurse Midwife | Admitting: Physical Therapy

## 2021-08-26 DIAGNOSIS — M6281 Muscle weakness (generalized): Secondary | ICD-10-CM | POA: Insufficient documentation

## 2021-08-26 DIAGNOSIS — R278 Other lack of coordination: Secondary | ICD-10-CM | POA: Diagnosis present

## 2021-08-26 DIAGNOSIS — M545 Low back pain, unspecified: Secondary | ICD-10-CM | POA: Diagnosis present

## 2021-08-26 NOTE — Therapy (Signed)
[facility] [place], [place], [address] Phone: [phone]   Fax:  [phone]  Physical Therapy Treatment  Patient Details  Name: Mackenzie Crawford MRN: [id number] Date of Birth: [birth year] Referring Provider (PT): Philip Aspen CNM   Encounter Date: 08/26/2021   PT End of Session - 08/26/21 1152     Visit Number 3    Date for PT Re-Evaluation 10/06/21    Authorization Type UHC    PT Start Time 2376    PT Stop Time 1145    PT Time Calculation (min) 40 min    Activity Tolerance Patient tolerated treatment well;No increased pain    Behavior During Therapy WFL for tasks assessed/performed             Past Medical History:  Diagnosis Date   Anxiety    Arthritis    knees, 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   Subjective Assessment - 08/26/21 1110     Subjective I have been good this week. I have been more active this week. I have had a little bit of pain.    Patient Stated Goals education, reduce chance of pelvic floor pain, and how to strengthen pelvic floor    Currently in Pain? Yes    Pain Score 4     Pain Location Hip    Pain Orientation Left    Pain Descriptors / Indicators Shooting    Pain Type Acute pain    Pain Onset More than a month ago    Pain Frequency Intermittent    Aggravating Factors  step down, getting up from the ground    Pain Relieving Factors stretches, roller to massage, massage ball and hydrogun    Multiple Pain Sites No                               [facility] Adult PT Treatment/Exercise - 08/26/21 0001       Self-Care   Self-Care Other Self-Care Comments    Other Self-Care Comments  discussed how to use the massage gun and  where to do it. Laying on the tennsi ball and move the hip internal and external rotation; educated patient on how to use the HEOME TENS unit for labour and where to place the electrodes; educated patient on looking at the [employer] and spinning babies      Exercises   Exercises Other Exercises    Other Exercises  cat cow in sitting, trunk rotation in sitting, transverse abdominus in supine and standing      Manual Therapy   Manual Therapy Soft tissue mobilization    Soft tissue mobilization trigger point massage in the left pririformis with moving hip in and out                     PT Education - 08/26/21 1151     Education Details Access Code: [email or web address]    Person(s) Educated Patient    Methods Explanation;Demonstration;Verbal cues;Handout    Comprehension Returned demonstration;Verbalized understanding              PT Short Term Goals - 08/26/21 1158  PT SHORT TERM GOAL #1   Title independent with initial HEP for flexibility and pelvic floor contraction    Time 4    Period Weeks    Status Achieved      PT SHORT TERM GOAL #2   Title pelvis in correct alignment for 4 weeks due to correction of muscle imbalances    Time 4    Period Weeks    Status Achieved      PT SHORT TERM GOAL #3   Title pain in left hip reduced >/= 25% due to reduction of trigger points and pelvis in correct alignment    Time 4    Period Weeks    Status Achieved               PT Long Term Goals - 07/14/21 2878       PT LONG TERM GOAL #1   Title Independent with postnatal care for the pelvic floor to reduce urinary leakage, falling out feeling, and reduce discomfort    Time 12    Period Weeks    Status New    Target Date 10/06/21      PT LONG TERM GOAL #2   Title understand ways to reduce urinary leakage with coughing and sneezing with full circular contraction of the pelvic floor holding for 10 seconds    Time 12    Period Weeks    Status New    Target Date  10/06/21      PT LONG TERM GOAL #3   Title pain in left hip and buttocks decreased >/= 50% due to reduction on trigger points in the pelvic floor and hip muscles    Time 12    Period Weeks    Status New    Target Date 10/06/21      PT LONG TERM GOAL #4   Title understand ways to strengthen the pelvic floor correctly after birth to reduce the falling out feeling she had after the first pregnancy    Time 12    Period Weeks    Status New    Target Date 10/06/21                   Plan - 08/26/21 1152     Clinical Impression Statement Patient is having less left hip pain and doing her HEP. She has a trigger point in her left piriformis and added hip rotation to reduce the trigger point. Patient understands how to use the massage gun on the left lumbar and SI joint. She understand the exercises she can post partum. She will be looking at the [facility] instagram for birthing positions. Patient understands how to stretch her ribs after the baby. Patient will benefit from skilled therapy to improve pelvic floor coordination and strength, education on pre and postpartum care of the pelvic floor and ways to give birth to not strain the pelvic floor.    Personal Factors and Comorbidities Fitness;Comorbidity 1    Comorbidities presently pregnant and due on 10/17/2021    Examination-Activity Limitations Locomotion Level;Stairs    Examination-Participation Restrictions Community Activity    Stability/Clinical Decision Making Stable/Uncomplicated    Rehab Potential Excellent    PT Frequency 1x / week    PT Duration 12 weeks    PT Treatment/Interventions ADLs/Self Care Home Management;Cryotherapy;Moist Heat;Therapeutic activities;Therapeutic exercise;Neuromuscular re-education;Patient/family education;Manual techniques;Passive range of motion;Taping;Spinal Manipulations;Joint Manipulations    PT Next Visit Plan go over toileting, how to breath correctly to push baby out, perineal massage  for  mobility of the perineal body; possible discharge    PT Home Exercise Plan Access Code: [email or web address]    Consulted and Agree with Plan of Care Patient             Patient will benefit from skilled therapeutic intervention in order to improve the following deficits and impairments:  Decreased coordination, Decreased range of motion, Increased fascial restricitons, Decreased endurance, Decreased activity tolerance, Pain, Increased muscle spasms, Decreased strength  Visit Diagnosis: Muscle weakness (generalized)  Other lack of coordination  Left-sided low back pain without sciatica, unspecified chronicity     Problem List Patient Active Problem List   Diagnosis Date Noted   Labor and delivery, indication for care 06/20/2021   STD exposure 02/20/2018   Dysmenorrhea 12/21/2017   Benign nevus 11/16/2016   Panic disorder 06/19/2014    Earlie Counts, PT 08/26/21 12:00 PM   [facility] @ [facility], [place], [address] Phone: [phone]   Fax:  [phone]  Name: Shima Compere MRN: [id number] Date of Birth: Jun 09, 1986

## 2021-08-26 NOTE — Patient Instructions (Signed)
Access Code: [email or web address] URL: [email or web address] Date: 08/26/2021 Prepared by: Earlie Counts  Program Notes lay on right side put foam roll between knees and pull left thigh backwards 10x 3 times per week.     Exercises Seated Pelvic Floor Contraction - 4 x daily - 7 x weekly - 1 sets - 10 reps - 5 sec hold Half Kneeling Hip Flexor Stretch with Sidebend - 1 x daily - 7 x weekly - 1 sets - 2 reps - 30 sec hold Supine Piriformis Stretch with Leg Straight - 1 x daily - 7 x weekly - 1 sets - 2 reps - 30 sec hold Seated Piriformis Stretch with Trunk Bend - 1 x daily - 7 x weekly - 1 sets - 2 reps - 30 sec hold Supine Bridge with Resistance Band - 1 x daily - 3 x weekly - 1 sets - 10 reps Seated Hamstring Curl with Anchored Resistance - 1 x daily - 3 x weekly - 1 sets - 10 reps Seated Bilateral AROM Pectoralis Stretch - 1 x daily - 3 x weekly - 1 sets - 10 reps Seated Cat Cow - 1 x daily - 7 x weekly - 1 sets - 10 reps Seated Trunk Rotation - Hands on Shoulders - 1 x daily - 7 x weekly - 1 sets - 10 reps Seated Sidebending - 1 x daily - 7 x weekly - 1 sets - 10 reps Seated Pelvic Floor Contraction - 1 x daily - 7 x weekly - 3 sets - 10 reps Supine Transversus Abdominis Bracing - Hands on Ground - 1 x daily - 7 x weekly - 1 sets - 10 reps  [facility] [address] [place], [place] [address] Phone # [phone] Fax [phone]

## 2021-09-06 ENCOUNTER — Ambulatory Visit (INDEPENDENT_AMBULATORY_CARE_PROVIDER_SITE_OTHER): Payer: Commercial Managed Care - PPO | Admitting: Certified Nurse Midwife

## 2021-09-06 ENCOUNTER — Other Ambulatory Visit: Payer: Self-pay

## 2021-09-06 VITALS — BP 117/79 | HR 80 | Wt 179.4 lb

## 2021-09-06 DIAGNOSIS — Z3483 Encounter for supervision of other normal pregnancy, third trimester: Secondary | ICD-10-CM

## 2021-09-06 DIAGNOSIS — Z3A34 34 weeks gestation of pregnancy: Secondary | ICD-10-CM

## 2021-09-06 LAB — POCT URINALYSIS DIPSTICK OB
Bilirubin, UA: NEGATIVE
Blood, UA: NEGATIVE
Glucose, UA: NEGATIVE
Ketones, UA: NEGATIVE
Leukocytes, UA: NEGATIVE
Nitrite, UA: NEGATIVE
POC,PROTEIN,UA: NEGATIVE
Spec Grav, UA: 1.02 (ref 1.010–1.025)
Urobilinogen, UA: 0.2 E.U./dL
pH, UA: 7 (ref 5.0–8.0)

## 2021-09-06 NOTE — Patient Instructions (Signed)
Fetal Movement Counts Patient Name: ________________________________________________ Patient Due Date: ____________________ What is a fetal movement count? A fetal movement count is the number of times that you feel your baby move during a certain amount of time. This may also be called a fetal kick count. A fetal movement count is recommended for every pregnant woman. You may be asked to start counting fetal movements as early as week 28 of your pregnancy. Pay attention to when your baby is most active. You may notice your baby's sleep and wake cycles. You may also notice things that make your baby move more. You should do a fetal movement count: When your baby is normally most active. At the same time each day. A good time to count movements is while you are resting, after having something to eat and drink. How do I count fetal movements? Find a quiet, comfortable area. Sit, or lie down on your side. Write down the date, the start time and stop time, and the number of movements that you felt between those two times. Take this information with you to your health care visits. Write down your start time when you feel the first movement. Count kicks, flutters, swishes, rolls, and jabs. You should feel at least 10 movements. You may stop counting after you have felt 10 movements, or if you have been counting for 2 hours. Write down the stop time. If you do not feel 10 movements in 2 hours, contact your health care provider for further instructions. Your health care provider may want to do additional tests to assess your baby's well-being. Contact a health care provider if: You feel fewer than 10 movements in 2 hours. Your baby is not moving like he or she usually does. Date: ____________ Start time: ____________ Stop time: ____________ Movements: ____________ Date: ____________ Start time: ____________ Stop time: ____________ Movements: ____________ Date: ____________ Start time: ____________ Stop  time: ____________ Movements: ____________ Date: ____________ Start time: ____________ Stop time: ____________ Movements: ____________ Date: ____________ Start time: ____________ Stop time: ____________ Movements: ____________ Date: ____________ Start time: ____________ Stop time: ____________ Movements: ____________ Date: ____________ Start time: ____________ Stop time: ____________ Movements: ____________ Date: ____________ Start time: ____________ Stop time: ____________ Movements: ____________ Date: ____________ Start time: ____________ Stop time: ____________ Movements: ____________ This information is not intended to replace advice given to you by your health care provider. Make sure you discuss any questions you have with your health care provider. Document Revised: 05/29/2019 Document Reviewed: 05/29/2019 Elsevier Patient Education  2022 [employer] of the uterus can occur throughout pregnancy, but they are not always a sign that you are in labor. You may have practice contractions called Braxton Hicks contractions. These false labor contractions are sometimes confused with true labor. What are [place] contractions? Braxton Hicks contractions are tightening movements that occur in the muscles of the uterus before labor. Unlike true labor contractions, these contractions do not result in opening (dilation) and thinning of the lowest part of the uterus (cervix). Toward the end of pregnancy (32-34 weeks), Braxton Hicks contractions can happen more often and may become stronger. These contractions are sometimes difficult to tell apart from true labor because they can be very uncomfortable. How to tell the difference between true labor and false labor True labor Contractions last 30-70 seconds. Contractions become very regular. Discomfort is usually felt in the top of the uterus, and it spreads to the lower abdomen and low back. Contractions do  not go away with walking.  Contractions usually become stronger and more frequent. The cervix dilates and gets thinner. False labor Contractions are usually shorter, weaker, and farther apart than true labor contractions. Contractions are usually irregular. Contractions are often felt in the front of the lower abdomen and in the groin. Contractions may go away when you walk around or change positions while lying down. The cervix usually does not dilate or become thin. Sometimes, the only way to tell if you are in true labor is for your health care provider to look for changes in your cervix. Your health care provider will do a physical exam and may monitor your contractions. If you are in true labor, your health care provider will send you home with instructions about when to return to the hospital. You may continue to have Braxton Hicks contractions until you go into true labor. Follow these instructions at home:  Take over-the-counter and prescription medicines only as told by your health care provider. If Braxton Hicks contractions are making you uncomfortable: Change your position from lying down or resting to walking, or change from walking to resting. Sit and rest in a tub of warm water. Drink enough fluid to keep your urine pale yellow. Dehydration may cause these contractions. Do slow and deep breathing several times an hour. Keep all follow-up visits. This is important. Contact a health care provider if: You have a fever. You have continuous pain in your abdomen. Your contractions become stronger, more regular, and closer together. You pass blood-tinged mucus. Get help right away if: You have fluid leaking or gushing from your vagina. You have bright red blood coming from your vagina. Your baby is not moving inside you as much as it used to. Summary You may have practice contractions called Braxton Hicks contractions. These false labor contractions are sometimes confused with  true labor. Braxton Hicks contractions are usually shorter, weaker, farther apart, and less regular than true labor contractions. True labor contractions usually become stronger, more regular, and more frequent. Manage discomfort from [facility] contractions by changing position, resting in a warm bath, practicing deep breathing, and drinking plenty of water. Keep all follow-up visits. Contact your health care provider if your contractions become stronger, more regular, and closer together. This information is not intended to replace advice given to you by your health care provider. Make sure you discuss any questions you have with your health care provider. Document Revised: 08/16/2020 Document Reviewed: 08/16/2020 Elsevier Patient Education  2022 [employer].

## 2021-09-06 NOTE — Progress Notes (Signed)
ROB doing well, feeling regular fetal movement , she denies any concerns today. She is going on a baby moon /anniversary trip , this weekend, Pedicure, perinatal message . PTL precautions reviewed.  Discussed GBS testing next visit. Follow up 2 wks.   Philip Aspen, CNM

## 2021-09-13 ENCOUNTER — Encounter: Payer: Commercial Managed Care - PPO | Attending: Family | Admitting: Physical Therapy

## 2021-09-13 ENCOUNTER — Encounter: Payer: Self-pay | Admitting: Physical Therapy

## 2021-09-13 ENCOUNTER — Other Ambulatory Visit: Payer: Self-pay

## 2021-09-13 DIAGNOSIS — M6281 Muscle weakness (generalized): Secondary | ICD-10-CM | POA: Diagnosis present

## 2021-09-13 DIAGNOSIS — M545 Low back pain, unspecified: Secondary | ICD-10-CM | POA: Diagnosis present

## 2021-09-13 DIAGNOSIS — R278 Other lack of coordination: Secondary | ICD-10-CM | POA: Diagnosis present

## 2021-09-13 NOTE — Therapy (Signed)
[facility] [address] [place], [place], [address] Phone: [phone]   Fax:  [phone]  Physical Therapy Treatment  Patient Details  Name: Mackenzie Crawford MRN: [id number] Date of Birth: Feb 14, 1986 Referring Provider (PT): Philip Aspen CNM   Encounter Date: 09/13/2021   PT End of Session - 09/13/21 1406     Visit Number 4    Date for PT Re-Evaluation 10/06/21    Authorization Type UHC    PT Start Time 1300    PT Stop Time 8891    PT Time Calculation (min) 55 min    Activity Tolerance Patient tolerated treatment well;No increased pain    Behavior During Therapy WFL for tasks assessed/performed             Past Medical History:  Diagnosis Date   Anxiety    Arthritis    knees, 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   Subjective Assessment - 09/13/21 1257     Subjective I had increased in Sciatica pain over the weekend.    Patient Stated Goals education, reduce chance of pelvic floor pain, and how to strengthen pelvic floor    Currently in Pain? Yes    Pain Score 2     Pain Location Hip    Pain Orientation Left    Pain Descriptors / Indicators Shooting    Pain Type Acute pain    Pain Onset More than a month ago    Pain Frequency Intermittent    Aggravating Factors  step down, getting up from the ground    Pain Relieving Factors stretches, roller to massage, massage ball and hydro gun    Multiple Pain Sites No                [facility] PT Assessment - 09/13/21 0001       Assessment   Medical Diagnosis O26.892, R10.2 Pelvic pain affecting pregnancy in second trimester, antepartum    Referring Provider (PT) Philip Aspen CNM    Prior Therapy none      Precautions    Precautions Other (comment)    Precaution Comments pregnant      Restrictions   Weight Bearing Restrictions No      Home Environment   Living Environment Private residence      Prior Function   Level of Independence Independent    Vocation Full time employment    Leisure working with her 33 year old      Cognition   Overall Cognitive Status Within Functional Limits for tasks assessed      Posture/Postural Control   Posture/Postural Control Postural limitations    Posture Comments Pregnant posture      AROM   Overall AROM Comments Lumbar ROM is full      PROM   Left Hip External Rotation  70      Strength   Right Hip ABduction 3+/5    Left Hip ABduction 3+/5      Palpation   SI assessment  left ilium posteriorly rotated; sacrum rotated right   corrected after manual work  Pelvic Floor Special Questions - 09/13/21 0001     Number of Pregnancies 2    Number of Vaginal Deliveries 1    Any difficulty with labor and deliveries Yes   26 min labor and went quick; 1st degree tear   Urinary Leakage Yes    Activities that cause leaking Coughing;Sneezing;With strong urge   no leakage during first pregnancy   Fecal incontinence No   sometimes strain to have a BM;   Falling out feeling (prolapse) No   after last pregnancy              [facility] Adult PT Treatment/Exercise - 09/13/21 0001       Self-Care   Self-Care Other Self-Care Comments    Other Self-Care Comments  abdominal massage to improve peristalic motion; education on using coconut oil to the vulvar area for vagianl dryness pre and post partum      Therapeutic Activites    Therapeutic Activities Other Therapeutic Activities    Other Therapeutic Activities education on toileting technique to reduce strain on hemorroids and correct breathing; education on perineal massage to do at 35 weeks and afterwards if she tears      Lumbar Exercises: Stretches   Other Lumbar Stretch  Exercise manually stretced the left hip to improve movement of the left SI joint      Manual Therapy   Manual Therapy Joint mobilization;Taping;Neural Stretch    Manual therapy comments left lower extremity neural stretch    Joint Mobilization PA glide of left ilium with knee at 90 degrees    Kinesiotex Facilitate Muscle      Kinesiotix   Facilitate Muscle  back-1 strip across the lumbar sacral area and two up and down the back; front-one under her belly and the other along the isdes of the abdomen                       PT Short Term Goals - 09/13/21 1403       PT SHORT TERM GOAL #1   Title independent with initial HEP for flexibility and pelvic floor contraction    Time 4    Period Weeks    Status Achieved    Target Date 08/12/21      PT SHORT TERM GOAL #2   Title pelvis in correct alignment for 4 weeks due to correction of muscle imbalances    Time 4    Period Weeks    Status Achieved      PT SHORT TERM GOAL #3   Title pain in left hip reduced >/= 25% due to reduction of trigger points and pelvis in correct alignment    Time 4    Period Weeks    Status Achieved    Target Date 08/12/21               PT Long Term Goals - 09/13/21 1403       PT LONG TERM GOAL #1   Title Independent with postnatal care for the pelvic floor to reduce urinary leakage, falling out feeling, and reduce discomfort    Time 12    Period Weeks    Status Achieved      PT LONG TERM GOAL #2   Title understand ways to reduce urinary leakage with coughing and sneezing with full circular contraction of the pelvic floor holding for 10 seconds    Time 12    Period Weeks    Status Achieved  PT LONG TERM GOAL #3   Title pain in left hip and buttocks decreased >/= 50% due to reduction on trigger points in the pelvic floor and hip muscles    Time 12    Period Weeks    Status Achieved      PT LONG TERM GOAL #4   Title understand ways to strengthen the pelvic floor correctly  after birth to reduce the falling out feeling she had after the first pregnancy    Time 12    Period Weeks    Status Achieved                   Plan - 09/13/21 1406     Clinical Impression Statement Paient understands how to tape her SI joint and abdomen to take the pressure off her left sciatical nerve. She does well with her pain unless she overdoes it. Patient understands different ways to give birth to take the strain off her back and pelvis. Patient has learned correct toileting techniques due to her having hemorroids and strains for a bowel movement. She understands ways to contract the pelvic floor to reduce the falling out feeling. Patient is ready for discharge.    Personal Factors and Comorbidities Fitness;Comorbidity 1    Comorbidities presently pregnant and due on 10/17/2021    Examination-Activity Limitations Locomotion Level;Stairs    Examination-Participation Restrictions Community Activity    Stability/Clinical Decision Making Stable/Uncomplicated    Rehab Potential Excellent    PT Treatment/Interventions ADLs/Self Care Home Management;Cryotherapy;Moist Heat;Therapeutic activities;Therapeutic exercise;Neuromuscular re-education;Patient/family education;Manual techniques;Passive range of motion;Taping;Spinal Manipulations;Joint Manipulations    PT Next Visit Plan Discharge  to HEP    PT Home Exercise Plan Access Code: [email or web address]    Consulted and Agree with Plan of Care Patient             Patient will benefit from skilled therapeutic intervention in order to improve the following deficits and impairments:  Decreased coordination, Decreased range of motion, Increased fascial restricitons, Decreased endurance, Decreased activity tolerance, Pain, Increased muscle spasms, Decreased strength  Visit Diagnosis: Muscle weakness (generalized)  Other lack of coordination  Left-sided low back pain without sciatica, unspecified chronicity     Problem List Patient  Active Problem List   Diagnosis Date Noted   Labor and delivery, indication for care 06/20/2021   STD exposure 02/20/2018   Dysmenorrhea 12/21/2017   Benign nevus 11/16/2016   Panic disorder 06/19/2014    Earlie Counts, PT 09/13/21 2:16 PM  [facility] [address] [place], [place], [address] Phone: [phone]   Fax:  [phone]  Name: Mackenzie Crawford MRN: [id number] Date of Birth: 13-Jun-1986  PHYSICAL THERAPY DISCHARGE SUMMARY  Visits from Start of Care: 4  Current functional level related to goals / functional outcomes: See above.    Remaining deficits: See above.    Education / Equipment: HEP   Patient agrees to discharge. Patient goals were met. Patient is being discharged due to meeting the stated rehab goals. Thank you for the referral. Earlie Counts, PT 09/13/21 2:17 PM

## 2021-09-20 ENCOUNTER — Other Ambulatory Visit: Payer: Self-pay

## 2021-09-20 ENCOUNTER — Encounter: Payer: Self-pay | Admitting: Certified Nurse Midwife

## 2021-09-20 ENCOUNTER — Ambulatory Visit (INDEPENDENT_AMBULATORY_CARE_PROVIDER_SITE_OTHER): Payer: Commercial Managed Care - PPO | Admitting: Certified Nurse Midwife

## 2021-09-20 VITALS — BP 125/80 | HR 116 | Wt 180.6 lb

## 2021-09-20 DIAGNOSIS — Z3A36 36 weeks gestation of pregnancy: Secondary | ICD-10-CM

## 2021-09-20 DIAGNOSIS — Z3A34 34 weeks gestation of pregnancy: Secondary | ICD-10-CM

## 2021-09-20 DIAGNOSIS — Z113 Encounter for screening for infections with a predominantly sexual mode of transmission: Secondary | ICD-10-CM

## 2021-09-20 DIAGNOSIS — Z3483 Encounter for supervision of other normal pregnancy, third trimester: Secondary | ICD-10-CM

## 2021-09-20 LAB — POCT URINALYSIS DIPSTICK OB
Bilirubin, UA: NEGATIVE
Blood, UA: NEGATIVE
Glucose, UA: NEGATIVE
Ketones, UA: NEGATIVE
Nitrite, UA: NEGATIVE
Spec Grav, UA: 1.015 (ref 1.010–1.025)
Urobilinogen, UA: 0.2 E.U./dL
pH, UA: 8 (ref 5.0–8.0)

## 2021-09-20 NOTE — Progress Notes (Signed)
ROB doing well. Feeling good movement. Has pressure and feels like she is running out of room. Reassurance given. GBS and cultures today. Disscused herbal prep-hand out given. SVE per pt request. 2/50/-2. Labor precautions reviewed. Follow up 1wk .  Philip Aspen, CNM

## 2021-09-20 NOTE — Patient Instructions (Signed)
Braxton Hicks Contractions Contractions of the uterus can occur throughout pregnancy, but they are not always a sign that you are in labor. You may have practice contractions called Braxton Hicks contractions. These false labor contractions are sometimes confused with true labor. What are [place] contractions? Braxton Hicks contractions are tightening movements that occur in the muscles of the uterus before labor. Unlike true labor contractions, these contractions do not result in opening (dilation) and thinning of the lowest part of the uterus (cervix). Toward the end of pregnancy (32-34 weeks), Braxton Hicks contractions can happen more often and may become stronger. These contractions are sometimes difficult to tell apart from true labor because they can be very uncomfortable. How to tell the difference between true labor and false labor True labor Contractions last 30-70 seconds. Contractions become very regular. Discomfort is usually felt in the top of the uterus, and it spreads to the lower abdomen and low back. Contractions do not go away with walking. Contractions usually become stronger and more frequent. The cervix dilates and gets thinner. False labor Contractions are usually shorter, weaker, and farther apart than true labor contractions. Contractions are usually irregular. Contractions are often felt in the front of the lower abdomen and in the groin. Contractions may go away when you walk around or change positions while lying down. The cervix usually does not dilate or become thin. Sometimes, the only way to tell if you are in true labor is for your health care provider to look for changes in your cervix. Your health care provider will do a physical exam and may monitor your contractions. If you are in true labor, your health care provider will send you home with instructions about when to return to the hospital. You may continue to have Braxton Hicks contractions until you  go into true labor. Follow these instructions at home:  Take over-the-counter and prescription medicines only as told by your health care provider. If Braxton Hicks contractions are making you uncomfortable: Change your position from lying down or resting to walking, or change from walking to resting. Sit and rest in a tub of warm water. Drink enough fluid to keep your urine pale yellow. Dehydration may cause these contractions. Do slow and deep breathing several times an hour. Keep all follow-up visits. This is important. Contact a health care provider if: You have a fever. You have continuous pain in your abdomen. Your contractions become stronger, more regular, and closer together. You pass blood-tinged mucus. Get help right away if: You have fluid leaking or gushing from your vagina. You have bright red blood coming from your vagina. Your baby is not moving inside you as much as it used to. Summary You may have practice contractions called Braxton Hicks contractions. These false labor contractions are sometimes confused with true labor. Braxton Hicks contractions are usually shorter, weaker, farther apart, and less regular than true labor contractions. True labor contractions usually become stronger, more regular, and more frequent. Manage discomfort from [facility] contractions by changing position, resting in a warm bath, practicing deep breathing, and drinking plenty of water. Keep all follow-up visits. Contact your health care provider if your contractions become stronger, more regular, and closer together. This information is not intended to replace advice given to you by your health care provider. Make sure you discuss any questions you have with your health care provider. Document Revised: 08/16/2020 Document Reviewed: 08/16/2020 Elsevier Patient Education  2022 [employer].

## 2021-09-22 LAB — STREP GP B NAA: Strep Gp B NAA: NEGATIVE

## 2021-09-23 ENCOUNTER — Encounter: Payer: Self-pay | Admitting: Certified Nurse Midwife

## 2021-09-23 LAB — GC/CHLAMYDIA PROBE AMP
Chlamydia trachomatis, NAA: NEGATIVE
Neisseria Gonorrhoeae by PCR: NEGATIVE

## 2021-09-27 ENCOUNTER — Ambulatory Visit (INDEPENDENT_AMBULATORY_CARE_PROVIDER_SITE_OTHER): Payer: Commercial Managed Care - PPO | Admitting: Certified Nurse Midwife

## 2021-09-27 ENCOUNTER — Other Ambulatory Visit: Payer: Self-pay

## 2021-09-27 ENCOUNTER — Encounter: Payer: Self-pay | Admitting: Certified Nurse Midwife

## 2021-09-27 ENCOUNTER — Other Ambulatory Visit: Payer: Self-pay | Admitting: Certified Nurse Midwife

## 2021-09-27 VITALS — BP 123/83 | Wt 182.0 lb

## 2021-09-27 DIAGNOSIS — Z3483 Encounter for supervision of other normal pregnancy, third trimester: Secondary | ICD-10-CM

## 2021-09-27 DIAGNOSIS — Z3A37 37 weeks gestation of pregnancy: Secondary | ICD-10-CM

## 2021-09-27 LAB — POCT URINALYSIS DIPSTICK OB
Spec Grav, UA: 1.015 (ref 1.010–1.025)
pH, UA: 6.5 (ref 5.0–8.0)

## 2021-09-27 NOTE — Progress Notes (Signed)
ROB , doing well. Feeling good fetal movement. Feeling lots of pressure. Requesting SVE. 3/50/-2  Labor precautions reviewed. Follow up 1 wk with MD.   Philip Aspen, CNM

## 2021-09-27 NOTE — Patient Instructions (Signed)
Braxton Hicks Contractions Contractions of the uterus can occur throughout pregnancy, but they are not always a sign that you are in labor. You may have practice contractions called Braxton Hicks contractions. These false labor contractions are sometimes confused with true labor. What are [place] contractions? Braxton Hicks contractions are tightening movements that occur in the muscles of the uterus before labor. Unlike true labor contractions, these contractions do not result in opening (dilation) and thinning of the lowest part of the uterus (cervix). Toward the end of pregnancy (32-34 weeks), Braxton Hicks contractions can happen more often and may become stronger. These contractions are sometimes difficult to tell apart from true labor because they can be very uncomfortable. How to tell the difference between true labor and false labor True labor Contractions last 30-70 seconds. Contractions become very regular. Discomfort is usually felt in the top of the uterus, and it spreads to the lower abdomen and low back. Contractions do not go away with walking. Contractions usually become stronger and more frequent. The cervix dilates and gets thinner. False labor Contractions are usually shorter, weaker, and farther apart than true labor contractions. Contractions are usually irregular. Contractions are often felt in the front of the lower abdomen and in the groin. Contractions may go away when you walk around or change positions while lying down. The cervix usually does not dilate or become thin. Sometimes, the only way to tell if you are in true labor is for your health care provider to look for changes in your cervix. Your health care provider will do a physical exam and may monitor your contractions. If you are in true labor, your health care provider will send you home with instructions about when to return to the hospital. You may continue to have Braxton Hicks contractions until you  go into true labor. Follow these instructions at home:  Take over-the-counter and prescription medicines only as told by your health care provider. If Braxton Hicks contractions are making you uncomfortable: Change your position from lying down or resting to walking, or change from walking to resting. Sit and rest in a tub of warm water. Drink enough fluid to keep your urine pale yellow. Dehydration may cause these contractions. Do slow and deep breathing several times an hour. Keep all follow-up visits. This is important. Contact a health care provider if: You have a fever. You have continuous pain in your abdomen. Your contractions become stronger, more regular, and closer together. You pass blood-tinged mucus. Get help right away if: You have fluid leaking or gushing from your vagina. You have bright red blood coming from your vagina. Your baby is not moving inside you as much as it used to. Summary You may have practice contractions called Braxton Hicks contractions. These false labor contractions are sometimes confused with true labor. Braxton Hicks contractions are usually shorter, weaker, farther apart, and less regular than true labor contractions. True labor contractions usually become stronger, more regular, and more frequent. Manage discomfort from [facility] contractions by changing position, resting in a warm bath, practicing deep breathing, and drinking plenty of water. Keep all follow-up visits. Contact your health care provider if your contractions become stronger, more regular, and closer together. This information is not intended to replace advice given to you by your health care provider. Make sure you discuss any questions you have with your health care provider. Document Revised: 08/16/2020 Document Reviewed: 08/16/2020 Elsevier Patient Education  2022 [employer].

## 2021-09-29 LAB — CULTURE, OB URINE

## 2021-09-29 LAB — URINE CULTURE, OB REFLEX: Organism ID, Bacteria: NO GROWTH

## 2021-10-03 ENCOUNTER — Encounter: Payer: Self-pay | Admitting: Certified Nurse Midwife

## 2021-10-03 MED ORDER — AZITHROMYCIN 250 MG PO TABS: ORAL_TABLET | ORAL | 0 refills | Status: DC

## 2021-10-04 ENCOUNTER — Observation Stay
Admission: EM | Admit: 2021-10-04 | Discharge: 2021-10-05 | Disposition: A | Discharge status: Home / Self Care | Payer: Commercial Managed Care - PPO | Attending: Obstetrics and Gynecology | Admitting: Obstetrics and Gynecology

## 2021-10-04 ENCOUNTER — Encounter: Payer: Self-pay | Admitting: Obstetrics and Gynecology

## 2021-10-04 ENCOUNTER — Other Ambulatory Visit: Payer: Self-pay

## 2021-10-04 DIAGNOSIS — O479 False labor, unspecified: Secondary | ICD-10-CM

## 2021-10-04 DIAGNOSIS — Z3A38 38 weeks gestation of pregnancy: Secondary | ICD-10-CM | POA: Insufficient documentation

## 2021-10-04 DIAGNOSIS — Z3490 Encounter for supervision of normal pregnancy, unspecified, unspecified trimester: Secondary | ICD-10-CM

## 2021-10-04 DIAGNOSIS — R059 Cough, unspecified: Secondary | ICD-10-CM | POA: Insufficient documentation

## 2021-10-04 DIAGNOSIS — O26893 Other specified pregnancy related conditions, third trimester: Secondary | ICD-10-CM | POA: Insufficient documentation

## 2021-10-04 MED ORDER — GUAIFENESIN 100 MG/5ML PO LIQD
5.0000 mL | ORAL | Status: DC | PRN
  Administered 2021-10-05: 5 mL via ORAL
  Filled 2021-10-04 (×2): qty 5

## 2021-10-04 NOTE — OB Triage Note (Addendum)
Pt Mackenzie Crawford 35 y.o. presents to labor and delivery triage reporting contractions every 2-3 mins starting around 3-4pm. Pt is a G2P1001 at [redacted]w[redacted]d. Pt denies signs and symptoms consistent with rupture of membranes or active vaginal bleeding. Pt states positive fetal movement. External FM and TOCO applied to non-tender abdomen and assessing. Initial FHR 135. Vital signs obtained and within normal limits. Provider notified of pt.

## 2021-10-05 ENCOUNTER — Encounter: Payer: Commercial Managed Care - PPO | Admitting: Obstetrics and Gynecology

## 2021-10-05 DIAGNOSIS — O26893 Other specified pregnancy related conditions, third trimester: Secondary | ICD-10-CM | POA: Diagnosis not present

## 2021-10-05 DIAGNOSIS — Z3A38 38 weeks gestation of pregnancy: Secondary | ICD-10-CM

## 2021-10-05 DIAGNOSIS — O479 False labor, unspecified: Secondary | ICD-10-CM

## 2021-10-05 DIAGNOSIS — R059 Cough, unspecified: Secondary | ICD-10-CM | POA: Diagnosis not present

## 2021-10-05 DIAGNOSIS — R109 Unspecified abdominal pain: Secondary | ICD-10-CM

## 2021-10-05 DIAGNOSIS — Z3490 Encounter for supervision of normal pregnancy, unspecified, unspecified trimester: Secondary | ICD-10-CM

## 2021-10-05 NOTE — Progress Notes (Signed)
°   10/04/21 2330  Fetal Heart Rate A  Mode External  Baseline Rate (A) 120 bpm  Variability 6-25 BPM  Accelerations 15 x 15  Decelerations None  Uterine Activity  Mode Toco  Contraction Frequency (min) 8-8.5  Contraction Duration (sec) 60-70  Contraction Quality Mild  Resting Tone Palpated Relaxed  Resting Time Adequate   NST is a category 1 tracing. MD made aware.

## 2021-10-05 NOTE — OB Triage Note (Signed)

## 2021-10-05 NOTE — Final Progress Note (Signed)
L&D OB Triage Note  Mackenzie Crawford is a 35 y.o. G2P1001 female at [redacted]w[redacted]d, EDD Estimated Date of Delivery: 10/17/21 who presented to triage for complaints of contractions q 2-3 minutes since 3 pm yesterday. She also reported a moderate cough for the past few days.  She underwent labor evaluation, and was found to be in latent labor. Vital signs stable. An NST was performed and has been reviewed by MD. She was treated with Robitussin for her cough.    Physical Exam:  Blood pressure 135/85, pulse 83, temperature 98.9 F (37.2 C), temperature source Oral, resp. rate 16, height 5\' 5"  (1.651 m), weight 82.6 kg, last menstrual period 01/10/2021, unknown if currently breastfeeding. Gen App: mild distress with contractions Cervix: 3/50/-2/posterior, changed to 4/50/-2 after 2 hours, then no further cervical change.   NST INTERPRETATION: Indications: rule out uterine contractions  Mode: External Baseline Rate (A): 125 bpm (fht) Variability: Moderate Accelerations: 15 x 15 Decelerations: None     Contraction Frequency (min): UTA  Impression: reactive   Plan: NST performed was reviewed and was found to be reactive. Patient observed for a total of 4 hours with only minimal cervical change. Uterine contractions began to dissipate.  She was discharged home with bleeding/labor precautions.  Continue routine prenatal care. Follow up with OB/GYN as previously scheduled.     Rubie Maid, MD [facility]

## 2021-10-07 ENCOUNTER — Ambulatory Visit (INDEPENDENT_AMBULATORY_CARE_PROVIDER_SITE_OTHER): Payer: Commercial Managed Care - PPO | Admitting: Obstetrics and Gynecology

## 2021-10-07 ENCOUNTER — Encounter: Payer: Self-pay | Admitting: Obstetrics and Gynecology

## 2021-10-07 ENCOUNTER — Other Ambulatory Visit: Payer: Self-pay

## 2021-10-07 VITALS — BP 118/84 | HR 95 | Wt 182.0 lb

## 2021-10-07 DIAGNOSIS — Z3A38 38 weeks gestation of pregnancy: Secondary | ICD-10-CM

## 2021-10-07 DIAGNOSIS — Z3483 Encounter for supervision of other normal pregnancy, third trimester: Secondary | ICD-10-CM

## 2021-10-07 LAB — POCT URINALYSIS DIPSTICK OB
Bilirubin, UA: NEGATIVE
Glucose, UA: NEGATIVE
Ketones, UA: NEGATIVE
Nitrite, UA: NEGATIVE
POC,PROTEIN,UA: NEGATIVE
Spec Grav, UA: 1.01 (ref 1.010–1.025)
Urobilinogen, UA: 0.2 E.U./dL
pH, UA: 7 (ref 5.0–8.0)

## 2021-10-07 NOTE — Progress Notes (Signed)
ROB: Had a few contractions last night, has some moderate pressure.

## 2021-10-07 NOTE — Progress Notes (Signed)
Notes some contractions last night, irregular.  Also feeling pelvic pressure. Was seen in triage 3 days ago for contractions, noted to have prodromal labor. Discussed labor precautions. RTC in 1 week if undelivered.

## 2021-10-10 ENCOUNTER — Encounter: Payer: Commercial Managed Care - PPO | Admitting: Certified Nurse Midwife

## 2021-10-10 ENCOUNTER — Ambulatory Visit (INDEPENDENT_AMBULATORY_CARE_PROVIDER_SITE_OTHER): Payer: Commercial Managed Care - PPO | Admitting: Certified Nurse Midwife

## 2021-10-10 ENCOUNTER — Inpatient Hospital Stay
Admission: EM | Admit: 2021-10-10 | Discharge: 2021-10-11 | DRG: 807 | Disposition: A | Discharge status: Home / Self Care | Payer: Commercial Managed Care - PPO | Attending: Certified Nurse Midwife | Admitting: Certified Nurse Midwife

## 2021-10-10 ENCOUNTER — Encounter: Payer: Self-pay | Admitting: Obstetrics and Gynecology

## 2021-10-10 ENCOUNTER — Inpatient Hospital Stay: Payer: Commercial Managed Care - PPO | Admitting: Registered Nurse

## 2021-10-10 ENCOUNTER — Other Ambulatory Visit: Payer: Self-pay

## 2021-10-10 VITALS — BP 136/82 | HR 91 | Wt 183.8 lb

## 2021-10-10 DIAGNOSIS — O26893 Other specified pregnancy related conditions, third trimester: Secondary | ICD-10-CM | POA: Diagnosis present

## 2021-10-10 DIAGNOSIS — Z3A39 39 weeks gestation of pregnancy: Secondary | ICD-10-CM

## 2021-10-10 DIAGNOSIS — Z20822 Contact with and (suspected) exposure to covid-19: Secondary | ICD-10-CM | POA: Diagnosis present

## 2021-10-10 DIAGNOSIS — Z3483 Encounter for supervision of other normal pregnancy, third trimester: Secondary | ICD-10-CM

## 2021-10-10 DIAGNOSIS — Z3A38 38 weeks gestation of pregnancy: Secondary | ICD-10-CM

## 2021-10-10 LAB — POCT URINALYSIS DIPSTICK OB
Bilirubin, UA: NEGATIVE
Blood, UA: NEGATIVE
Glucose, UA: NEGATIVE
Ketones, UA: NEGATIVE
Leukocytes, UA: NEGATIVE
Nitrite, UA: NEGATIVE
POC,PROTEIN,UA: NEGATIVE
Spec Grav, UA: 1.02 (ref 1.010–1.025)
Urobilinogen, UA: 0.2 E.U./dL
pH, UA: 7 (ref 5.0–8.0)

## 2021-10-10 LAB — TYPE AND SCREEN
ABO/RH(D): B NEG
Antibody Screen: POSITIVE

## 2021-10-10 LAB — RESP PANEL BY RT-PCR (FLU A&B, COVID) ARPGX2
Influenza A by PCR: NEGATIVE
Influenza B by PCR: NEGATIVE
SARS Coronavirus 2 by RT PCR: NEGATIVE

## 2021-10-10 LAB — CBC
HCT: 34.3 % — ABNORMAL LOW (ref 36.0–46.0)
Hemoglobin: 11.5 g/dL — ABNORMAL LOW (ref 12.0–15.0)
MCH: 29.2 pg (ref 26.0–34.0)
MCHC: 33.5 g/dL (ref 30.0–36.0)
MCV: 87.1 fL (ref 80.0–100.0)
Platelets: 226 10*3/uL (ref 150–400)
RBC: 3.94 MIL/uL (ref 3.87–5.11)
RDW: 13.2 % (ref 11.5–15.5)
WBC: 14.4 10*3/uL — ABNORMAL HIGH (ref 4.0–10.5)
nRBC: 0 % (ref 0.0–0.2)

## 2021-10-10 MED ORDER — FENTANYL-BUPIVACAINE-NACL 0.5-0.125-0.9 MG/250ML-% EP SOLN
EPIDURAL | Status: AC
  Filled 2021-10-10: qty 250

## 2021-10-10 MED ORDER — LACTATED RINGERS IV SOLN
500.0000 mL | INTRAVENOUS | Status: DC | PRN
  Administered 2021-10-10: 16:00:00 500 mL via INTRAVENOUS

## 2021-10-10 MED ORDER — OXYTOCIN 10 UNIT/ML IJ SOLN
INTRAMUSCULAR | Status: AC
  Filled 2021-10-10: qty 2

## 2021-10-10 MED ORDER — LIDOCAINE HCL (PF) 1 % IJ SOLN
INTRAMUSCULAR | Status: DC | PRN
  Administered 2021-10-10: 3 mL via SUBCUTANEOUS

## 2021-10-10 MED ORDER — MISOPROSTOL 200 MCG PO TABS
ORAL_TABLET | ORAL | Status: AC
  Filled 2021-10-10: qty 4

## 2021-10-10 MED ORDER — AMMONIA AROMATIC IN INHA
RESPIRATORY_TRACT | Status: AC
  Filled 2021-10-10: qty 10

## 2021-10-10 MED ORDER — FENTANYL-BUPIVACAINE-NACL 0.5-0.125-0.9 MG/250ML-% EP SOLN
EPIDURAL | Status: DC | PRN
  Administered 2021-10-10: 12 mL/h via EPIDURAL

## 2021-10-10 MED ORDER — PRENATAL MULTIVITAMIN CH
1.0000 | ORAL_TABLET | Freq: Every day | ORAL | Status: DC
  Administered 2021-10-11: 11:00:00 1 via ORAL
  Filled 2021-10-10: qty 1

## 2021-10-10 MED ORDER — LIDOCAINE HCL (PF) 1 % IJ SOLN
INTRAMUSCULAR | Status: AC
  Filled 2021-10-10: qty 30

## 2021-10-10 MED ORDER — SENNOSIDES-DOCUSATE SODIUM 8.6-50 MG PO TABS
2.0000 | ORAL_TABLET | Freq: Every day | ORAL | Status: DC
  Administered 2021-10-10: 23:00:00 2 via ORAL
  Filled 2021-10-10 (×2): qty 2

## 2021-10-10 MED ORDER — OXYCODONE-ACETAMINOPHEN 5-325 MG PO TABS: 1.0000 | ORAL_TABLET | ORAL | Status: DC | PRN

## 2021-10-10 MED ORDER — COCONUT OIL OIL
1.0000 "application " | TOPICAL_OIL | Status: DC | PRN
  Filled 2021-10-10 (×2): qty 120

## 2021-10-10 MED ORDER — ACETAMINOPHEN 325 MG PO TABS
650.0000 mg | ORAL_TABLET | ORAL | Status: DC | PRN
  Administered 2021-10-10: 23:00:00 650 mg via ORAL

## 2021-10-10 MED ORDER — BUPIVACAINE HCL (PF) 0.25 % IJ SOLN
INTRAMUSCULAR | Status: DC | PRN
Start: 2021-10-10 — End: 2021-10-10
  Administered 2021-10-10: 7 mL via EPIDURAL

## 2021-10-10 MED ORDER — LIDOCAINE HCL (PF) 1 % IJ SOLN: 30.0000 mL | INTRAMUSCULAR | Status: DC | PRN

## 2021-10-10 MED ORDER — TERBUTALINE SULFATE 1 MG/ML IJ SOLN: 0.2500 mg | Freq: Once | INTRAMUSCULAR | Status: DC | PRN

## 2021-10-10 MED ORDER — SIMETHICONE 80 MG PO CHEW: 80.0000 mg | CHEWABLE_TABLET | ORAL | Status: DC | PRN

## 2021-10-10 MED ORDER — METHYLERGONOVINE MALEATE 0.2 MG PO TABS
0.2000 mg | ORAL_TABLET | ORAL | Status: DC | PRN
  Filled 2021-10-10: qty 1

## 2021-10-10 MED ORDER — DIBUCAINE (PERIANAL) 1 % EX OINT: 1.0000 "application " | TOPICAL_OINTMENT | CUTANEOUS | Status: DC | PRN

## 2021-10-10 MED ORDER — OXYTOCIN-SODIUM CHLORIDE 30-0.9 UT/500ML-% IV SOLN
2.5000 [IU]/h | INTRAVENOUS | Status: DC
  Administered 2021-10-10: 21:00:00 2.5 [IU]/h via INTRAVENOUS

## 2021-10-10 MED ORDER — OXYCODONE-ACETAMINOPHEN 5-325 MG PO TABS: 2.0000 | ORAL_TABLET | ORAL | Status: DC | PRN

## 2021-10-10 MED ORDER — ONDANSETRON HCL 4 MG/2ML IJ SOLN: 4.0000 mg | Freq: Four times a day (QID) | INTRAMUSCULAR | Status: DC | PRN

## 2021-10-10 MED ORDER — DOCUSATE SODIUM 100 MG PO CAPS
100.0000 mg | ORAL_CAPSULE | Freq: Two times a day (BID) | ORAL | Status: DC
  Administered 2021-10-11: 09:00:00 100 mg via ORAL
  Filled 2021-10-10: qty 1

## 2021-10-10 MED ORDER — WITCH HAZEL-GLYCERIN EX PADS
1.0000 "application " | MEDICATED_PAD | CUTANEOUS | Status: DC | PRN
  Filled 2021-10-10: qty 100

## 2021-10-10 MED ORDER — ONDANSETRON HCL 4 MG/2ML IJ SOLN: 4.0000 mg | INTRAMUSCULAR | Status: DC | PRN

## 2021-10-10 MED ORDER — OXYTOCIN-SODIUM CHLORIDE 30-0.9 UT/500ML-% IV SOLN
1.0000 m[IU]/min | INTRAVENOUS | Status: DC
  Administered 2021-10-10: 19:00:00 4 m[IU]/min via INTRAVENOUS

## 2021-10-10 MED ORDER — ONDANSETRON HCL 4 MG PO TABS: 4.0000 mg | ORAL_TABLET | ORAL | Status: DC | PRN

## 2021-10-10 MED ORDER — BENZOCAINE-MENTHOL 20-0.5 % EX AERO
1.0000 "application " | INHALATION_SPRAY | CUTANEOUS | Status: DC | PRN
  Filled 2021-10-10 (×2): qty 56

## 2021-10-10 MED ORDER — LACTATED RINGERS IV SOLN: INTRAVENOUS | Status: DC

## 2021-10-10 MED ORDER — IBUPROFEN 600 MG PO TABS
600.0000 mg | ORAL_TABLET | Freq: Four times a day (QID) | ORAL | Status: DC
  Administered 2021-10-10 – 2021-10-11 (×3): 600 mg via ORAL
  Filled 2021-10-10 (×3): qty 1

## 2021-10-10 MED ORDER — ACETAMINOPHEN 325 MG PO TABS
650.0000 mg | ORAL_TABLET | ORAL | Status: DC | PRN
  Filled 2021-10-10: qty 2

## 2021-10-10 MED ORDER — LIDOCAINE-EPINEPHRINE (PF) 1.5 %-1:200000 IJ SOLN
INTRAMUSCULAR | Status: DC | PRN
  Administered 2021-10-10: 5 mL via EPIDURAL

## 2021-10-10 MED ORDER — METHYLERGONOVINE MALEATE 0.2 MG/ML IJ SOLN
0.2000 mg | INTRAMUSCULAR | Status: DC | PRN
  Filled 2021-10-10: qty 1

## 2021-10-10 MED ORDER — BUTORPHANOL TARTRATE 1 MG/ML IJ SOLN: 1.0000 mg | INTRAMUSCULAR | Status: DC | PRN

## 2021-10-10 MED ORDER — OXYTOCIN BOLUS FROM INFUSION
333.0000 mL | Freq: Once | INTRAVENOUS | Status: AC
  Administered 2021-10-10: 20:00:00 333 mL via INTRAVENOUS

## 2021-10-10 MED ORDER — FERROUS SULFATE 325 (65 FE) MG PO TABS
325.0000 mg | ORAL_TABLET | Freq: Every day | ORAL | Status: DC
  Administered 2021-10-11: 09:00:00 325 mg via ORAL
  Filled 2021-10-10: qty 1

## 2021-10-10 MED ORDER — OXYTOCIN-SODIUM CHLORIDE 30-0.9 UT/500ML-% IV SOLN
INTRAVENOUS | Status: AC
  Filled 2021-10-10: qty 500

## 2021-10-10 NOTE — Patient Instructions (Signed)
Braxton Hicks Contractions Contractions of the uterus can occur throughout pregnancy, but they are not always a sign that you are in labor. You may have practice contractions called Braxton Hicks contractions. These false labor contractions are sometimes confused with true labor. What are [place] contractions? Braxton Hicks contractions are tightening movements that occur in the muscles of the uterus before labor. Unlike true labor contractions, these contractions do not result in opening (dilation) and thinning of the lowest part of the uterus (cervix). Toward the end of pregnancy (32-34 weeks), Braxton Hicks contractions can happen more often and may become stronger. These contractions are sometimes difficult to tell apart from true labor because they can be very uncomfortable. How to tell the difference between true labor and false labor True labor Contractions last 30-70 seconds. Contractions become very regular. Discomfort is usually felt in the top of the uterus, and it spreads to the lower abdomen and low back. Contractions do not go away with walking. Contractions usually become stronger and more frequent. The cervix dilates and gets thinner. False labor Contractions are usually shorter, weaker, and farther apart than true labor contractions. Contractions are usually irregular. Contractions are often felt in the front of the lower abdomen and in the groin. Contractions may go away when you walk around or change positions while lying down. The cervix usually does not dilate or become thin. Sometimes, the only way to tell if you are in true labor is for your health care provider to look for changes in your cervix. Your health care provider will do a physical exam and may monitor your contractions. If you are in true labor, your health care provider will send you home with instructions about when to return to the hospital. You may continue to have Braxton Hicks contractions until you  go into true labor. Follow these instructions at home:  Take over-the-counter and prescription medicines only as told by your health care provider. If Braxton Hicks contractions are making you uncomfortable: Change your position from lying down or resting to walking, or change from walking to resting. Sit and rest in a tub of warm water. Drink enough fluid to keep your urine pale yellow. Dehydration may cause these contractions. Do slow and deep breathing several times an hour. Keep all follow-up visits. This is important. Contact a health care provider if: You have a fever. You have continuous pain in your abdomen. Your contractions become stronger, more regular, and closer together. You pass blood-tinged mucus. Get help right away if: You have fluid leaking or gushing from your vagina. You have bright red blood coming from your vagina. Your baby is not moving inside you as much as it used to. Summary You may have practice contractions called Braxton Hicks contractions. These false labor contractions are sometimes confused with true labor. Braxton Hicks contractions are usually shorter, weaker, farther apart, and less regular than true labor contractions. True labor contractions usually become stronger, more regular, and more frequent. Manage discomfort from [facility] contractions by changing position, resting in a warm bath, practicing deep breathing, and drinking plenty of water. Keep all follow-up visits. Contact your health care provider if your contractions become stronger, more regular, and closer together. This information is not intended to replace advice given to you by your health care provider. Make sure you discuss any questions you have with your health care provider. Document Revised: 08/16/2020 Document Reviewed: 08/16/2020 Elsevier Patient Education  2022 [employer].

## 2021-10-10 NOTE — Anesthesia Preprocedure Evaluation (Addendum)
Anesthesia Evaluation  Patient identified by MRN, date of birth, ID band Patient awake    Reviewed: Allergy & Precautions, H&P , NPO status , Patient's Chart, lab work & pertinent test results  Airway Mallampati: II       Dental   Pulmonary former smoker,           Cardiovascular negative cardio ROS       Neuro/Psych PSYCHIATRIC DISORDERS Anxiety negative neurological ROS     GI/Hepatic Neg liver ROS, GERD  ,  Endo/Other  negative endocrine ROS  Renal/GU   negative genitourinary   Musculoskeletal   Abdominal   Peds  Hematology negative hematology ROS (+)   Anesthesia Other Findings   Reproductive/Obstetrics (+) Pregnancy                             Anesthesia Physical Anesthesia Plan  ASA: 2  Anesthesia Plan: Epidural   Post-op Pain Management:    Induction:   PONV Risk Score and Plan:   Airway Management Planned:   Additional Equipment:   Intra-op Plan:   Post-operative Plan:   Informed Consent:   Plan Discussed with: Anesthesiologist and CRNA  Anesthesia Plan Comments:         Anesthesia Quick Evaluation

## 2021-10-10 NOTE — Progress Notes (Signed)
LABOR NOTE   Mackenzie Crawford 35 y.o.GP@ at [redacted]w[redacted]d  SUBJECTIVE:  Comfortable with epidural.  Analgesia: Epidural  OBJECTIVE:  BP (!) 110/57    Pulse 81    Temp 98.7 F (37.1 C) (Oral)    Resp 18    Ht 5\' 5"  (1.651 m)    Wt 83.4 kg    LMP 01/10/2021 (Exact Date)    SpO2 98%    BMI 30.59 kg/m  No intake/output data recorded.  She has shown cervical change. CERVIX: 6-7:  90:   -2:   mid position:   soft SVE:   Dilation: 6.5 Effacement (%): 90 Station: -2 Exam by:: A Zilda No,CNM CONTRACTIONS: regular, every 2-4 minutes FHR: Fetal heart tracing reviewed. Baseline: 120 bpm, Variability: Good {> 6 bpm), Accelerations: Reactive, and Decelerations: none  Category I.    Labs: Lab Results  Component Value Date   WBC 14.4 (H) 10/10/2021   HGB 11.5 (L) 10/10/2021   HCT 34.3 (L) 10/10/2021   MCV 87.1 10/10/2021   PLT 226 10/10/2021    ASSESSMENT: 1) Labor curve reviewed.       Progress: Active phase labor.     Membranes: Intact/bulging.      AROM - clear        Active Problems:   Labor and delivery, indication for care   PLAN: Discussed AROM. Patient and partner agree to plan and gave consent. Discussed potential use of Pitocin for augmentation if necessary. Continue current management.   Philip Aspen, CNM 10/10/2021 5:06 PM

## 2021-10-10 NOTE — Anesthesia Procedure Notes (Signed)
Epidural Patient location during procedure: OB Start time: 10/10/2021 4:08 PM End time: 10/10/2021 4:11 PM  Staffing Resident/CRNA: Jonna Clark, CRNA Performed: resident/CRNA   Preanesthetic Checklist Completed: patient identified, IV checked, site marked, risks and benefits discussed, surgical consent, monitors and equipment checked, pre-op evaluation and timeout performed  Epidural Patient position: sitting Prep: ChloraPrep Patient monitoring: heart rate, continuous pulse ox and blood pressure Approach: midline Location: L4-L5 Injection technique: LOR saline  Needle:  Needle type: Tuohy  Needle gauge: 18 G Needle length: 9 cm and 9 Needle insertion depth: 7.5 cm Catheter type: closed end flexible Catheter size: 20 Guage Catheter at skin depth: 13 cm Test dose: negative and 1.5% lidocaine with Epi 1:200 K  Assessment Events: blood not aspirated, injection not painful, no injection resistance, no paresthesia and negative IV test  Additional Notes   Patient tolerated the insertion well without complications.Reason for block:procedure for pain

## 2021-10-10 NOTE — OB Triage Note (Signed)
Patient seen at office at 845 am for ROB. Patients states she checked your cervix at office at was 5cm. Pt since then at 1145 started contracting every 2-7 minutes rating 4/10 on pain scale. Patient states baby is moving well, Denies LOF, has had scant amountf bleeding post cervical exam. Denies any other needs at this time

## 2021-10-10 NOTE — H&P (Signed)
History and Physical   HPI  Mackenzie Crawford is a 35 y.o. G2P1001 at [redacted]w[redacted]d Estimated Date of Delivery: 10/17/21 who is being admitted for labor management. Was seen in the office and cervical exam was 4-5. She has made cervical change over the past two hours and is being admitted for labor.   OB History  OB History  Gravida Para Term Preterm AB Living  2 1 1  0 0 1  SAB IAB Ectopic Multiple Live Births  0 0 0 0 1    # Outcome Date GA Lbr Len/2nd Weight Sex Delivery Anes PTL Lv  2 Current           1 Term 05/09/19 [redacted]w[redacted]d 03:27 / 01:15 3380 g M Vag-Spont EPI  LIV     Name: Creedon,BOY Apryll     Apgar1: 9  Apgar5: 9    PROBLEM LIST  Pregnancy complications or risks: Patient Active Problem List   Diagnosis Date Noted   Pregnancy 10/05/2021   Irregular uterine contractions    Labor and delivery, indication for care 06/20/2021   STD exposure 02/20/2018   Dysmenorrhea 12/21/2017   Benign nevus 11/16/2016   Panic disorder 06/19/2014    Prenatal labs and studies: ABO, Rh:  B negative (05/09/2019) Antibody: Negative (06/03 1019) Rubella: 1.99 (06/03 1019) RPR: Non Reactive (10/05 1005)  HBsAg: Negative (06/03 1019)  HIV: Non Reactive (06/03 1019)  KDX:IPJASNKN/-- (11/29 1009)   Past Medical History:  Diagnosis Date   Anxiety    Arthritis    knees, 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    Prenatal Vit-Fe Fumarate-FA (PRENATAL MULTIVITAMIN) TABS tablet Take 1 tablet by mouth daily at 12 noon.    Probiotic Product (PROBIOTIC-10 PO) Take by mouth.    acetaminophen (TYLENOL) 500 MG tablet Take 1,000 mg by mouth every 6 (six) hours as  needed.    azithromycin (ZITHROMAX) 250 MG tablet Take as directed: Two pills by mouth the first day, then one pill every day until completed Qty: 6 tablet, Refills: 0         Allergies  Patient has no known allergies.  Review of Systems  A comprehensive review of systems was negative except for: contractions  Physical Exam  BP 129/85    Pulse 88    Resp 18    LMP 01/10/2021 (Exact Date)   Lungs:  CTA B Cardio: RRR  Abd: Soft, gravid, NT Presentation: cephalic EXT:  no Edema CERVIX: Dilation: 6 Effacement (%): 90 Exam by:: Deneise Lever Thopson,CNM  See Prenatal records for more detailed PE.     FHR:  Baseline: 120 bpm, Variability: Good {> 6 bpm), and Accelerations: Reactive  Toco: Uterine Contractions: Frequency: Every 3-4 minutes, Duration: 60-80 seconds, and Intensity: mild/moderate  Test Results  Results for orders placed or performed in visit on 10/10/21 (from the past 24 hour(s))  POC Urinalysis Dipstick OB     Status: Normal   Collection Time: 10/10/21  8:44 AM  Result Value Ref Range   Color, UA     Clarity, UA  Glucose, UA Negative Negative   Bilirubin, UA neg    Ketones, UA neg    Spec Grav, UA 1.020 1.010 - 1.025   Blood, UA neg    pH, UA 7.0 5.0 - 8.0   POC,PROTEIN,UA Negative Negative, Trace, Small (1+), Moderate (2+), Large (3+), 4+   Urobilinogen, UA 0.2 0.2 or 1.0 E.U./dL   Nitrite, UA neg    Leukocytes, UA Negative Negative   Appearance     Odor     Group B Strep negative, Rh negative  Assessment   G2P1001 at [redacted]w[redacted]d Estimated Date of Delivery: 10/17/21  The fetus is reassuring.   Patient Active Problem List   Diagnosis Date Noted   Pregnancy 10/05/2021   Irregular uterine contractions    Labor and delivery, indication for care 06/20/2021   STD exposure 02/20/2018   Dysmenorrhea 12/21/2017   Benign nevus 11/16/2016   Panic disorder 06/19/2014    Plan  1. Admit to L&D    2. EFM: Continuous-- Category 1 3. Stadol or Epidural  if desired.   4. Admission labs  5. MD aware of admission. 6. Anticipate SVD.  Philip Aspen, CNM 10/10/2021 2:14 PM

## 2021-10-10 NOTE — Progress Notes (Signed)
LABOR NOTE   Mackenzie Crawford 35 y.o.GP@ at [redacted]w[redacted]d  SUBJECTIVE:  Comfortable with epidural.  Analgesia: Epidural  OBJECTIVE:  BP 118/84    Pulse 77    Temp 98.7 F (37.1 C) (Oral)    Resp 18    Ht 5\' 5"  (1.651 m)    Wt 83.4 kg    LMP 01/10/2021 (Exact Date)    SpO2 98%    BMI 30.59 kg/m  No intake/output data recorded.  She has not shown cervical change. CERVIX: 8 cm:  90%:   -2:   mid position:   soft SVE:   Dilation: 8 Effacement (%): 80, 90 Station: -2 Exam by:: Grandville Silos CNM CONTRACTIONS: irregular, every 4-5 minutes FHR: Fetal heart tracing reviewed. Baseline: 105 bpm, Variability: Good {> 6 bpm), Accelerations: Reactive, and Decelerations: Absent Category I    Labs: Lab Results  Component Value Date   WBC 14.4 (H) 10/10/2021   HGB 11.5 (L) 10/10/2021   HCT 34.3 (L) 10/10/2021   MCV 87.1 10/10/2021   PLT 226 10/10/2021    ASSESSMENT: 1) Labor curve reviewed.       Progress: Active phase labor.     Membranes: ruptured, clear fluid        Active Problems:   Labor and delivery, indication for care   PLAN: IV Pitocin augmentation discussed with pt and her spouse, they are in agreement to plan .   Philip Aspen, CNM  10/10/2021 7:17 PM

## 2021-10-10 NOTE — Progress Notes (Signed)
ROB, doing well, feeling good movement. Has episodes of contractions. Labor precautions reviewed. Discussed induction 41 wks. NST and ROB one week.   Philip Aspen, CNM

## 2021-10-11 ENCOUNTER — Encounter: Payer: Self-pay | Admitting: Certified Nurse Midwife

## 2021-10-11 LAB — CBC
HCT: 31 % — ABNORMAL LOW (ref 36.0–46.0)
Hemoglobin: 10.2 g/dL — ABNORMAL LOW (ref 12.0–15.0)
MCH: 28.6 pg (ref 26.0–34.0)
MCHC: 32.9 g/dL (ref 30.0–36.0)
MCV: 86.8 fL (ref 80.0–100.0)
Platelets: 187 10*3/uL (ref 150–400)
RBC: 3.57 MIL/uL — ABNORMAL LOW (ref 3.87–5.11)
RDW: 13.2 % (ref 11.5–15.5)
WBC: 12.6 10*3/uL — ABNORMAL HIGH (ref 4.0–10.5)
nRBC: 0 % (ref 0.0–0.2)

## 2021-10-11 LAB — RPR: RPR Ser Ql: NONREACTIVE

## 2021-10-11 LAB — FETAL SCREEN: Fetal Screen: NEGATIVE

## 2021-10-11 MED ORDER — RHO D IMMUNE GLOBULIN 1500 UNIT/2ML IJ SOSY
300.0000 ug | PREFILLED_SYRINGE | Freq: Once | INTRAMUSCULAR | Status: AC
  Administered 2021-10-11: 11:00:00 300 ug via INTRAVENOUS
  Filled 2021-10-11: qty 2

## 2021-10-11 MED ORDER — VARICELLA VIRUS VACCINE LIVE 1350 PFU/0.5ML IJ SUSR
0.5000 mL | Freq: Once | INTRAMUSCULAR | Status: DC
  Filled 2021-10-11 (×2): qty 0.5

## 2021-10-11 NOTE — Anesthesia Postprocedure Evaluation (Signed)
Anesthesia Post Note  Patient: Mackenzie Crawford  Procedure(s) Performed: AN AD Humphrey  Patient location during evaluation: Mother Baby Anesthesia Type: Epidural Level of consciousness: awake and alert Pain management: pain level controlled Vital Signs Assessment: post-procedure vital signs reviewed and stable Respiratory status: spontaneous breathing, nonlabored ventilation and respiratory function stable Cardiovascular status: stable Postop Assessment: no headache, no backache, epidural receding and able to ambulate Anesthetic complications: no Comments: Patient reports some tingling sensation in the left shin, which is improving over time.   No notable events documented.   Last Vitals:  Vitals:   10/11/21 0100 10/11/21 0307  BP: 126/69 112/66  Pulse: 84 67  Resp: 16 16  Temp: 36.9 C 36.9 C  SpO2:  99%    Last Pain:  Vitals:   10/11/21 0307  TempSrc: Oral  PainSc:                  Lia Foyer

## 2021-10-11 NOTE — Discharge Summary (Signed)
Patient DC instructions reviewed with patient. Verbalizes understanding of instructions and follow up appt. No prescriptions at this time. Patient Dc'd via wheelchair with infant in arms to home.

## 2021-10-11 NOTE — Final Progress Note (Signed)
Discharge Day SOAP Note:  Progress Note - Vaginal Delivery  Mackenzie Crawford is a 35 y.o. G2P2002 now PP day 1 s/p Vaginal, Spontaneous . Delivery was uncomplicated  Subjective  The patient has the following complaints: has no unusual complaints  Pain is controlled with current medications.   Patient is urinating without difficulty.  She is ambulating well.     Objective  Vital signs: BP 123/79 (BP Location: Right Arm)    Pulse 62    Temp 97.9 F (36.6 C) (Oral)    Resp 18    Ht 5\' 5"  (1.651 m)    Wt 83.4 kg    LMP 01/10/2021 (Exact Date)    SpO2 99%    Breastfeeding Unknown    BMI 30.59 kg/m   Physical Exam: Gen: NAD Fundus Fundal Tone: Firm @ U-1  Lochia Amount: Scant        Data Review Labs: Lab Results  Component Value Date   WBC 12.6 (H) 10/11/2021   HGB 10.2 (L) 10/11/2021   HCT 31.0 (L) 10/11/2021   MCV 86.8 10/11/2021   PLT 187 10/11/2021   CBC Latest Ref Rng & Units 10/11/2021 10/10/2021 07/27/2021  WBC 4.0 - 10.5 K/uL 12.6(H) 14.4(H) 7.4  Hemoglobin 12.0 - 15.0 g/dL 10.2(L) 11.5(L) 11.3  Hematocrit 36.0 - 46.0 % 31.0(L) 34.3(L) 34.3  Platelets 150 - 400 K/uL 187 226 203   B NEG  Edinburgh Score: Edinburgh Postnatal Depression Scale Screening Tool 10/10/2021  I have been able to laugh and see the funny side of things. 0  I have looked forward with enjoyment to things. 0  I have blamed myself unnecessarily when things went wrong. 0  I have been anxious or worried for no good reason. 0  I have felt scared or panicky for no good reason. 0  Things have been getting on top of me. 0  I have been so unhappy that I have had difficulty sleeping. 0  I have felt sad or miserable. 0  I have been so unhappy that I have been crying. 0  The thought of harming myself has occurred to me. 0  Edinburgh Postnatal Depression Scale Total 0    Assessment/Plan  Active Problems:   Labor and delivery, indication for care    Plan for discharge today.  Discharge  Instructions: Per After Visit Summary. Activity: Advance as tolerated. Pelvic rest for 6 weeks.  Also refer to After Visit Summary Diet: Regular Medications: Allergies as of 10/11/2021   No Known Allergies      Medication List     STOP taking these medications    azithromycin 250 MG tablet Commonly known as: ZITHROMAX       TAKE these medications    acetaminophen 500 MG tablet Commonly known as: TYLENOL Take 1,000 mg by mouth every 6 (six) hours as needed.   MAGNESIUM PO Take by mouth.   prenatal multivitamin Tabs tablet Take 1 tablet by mouth daily at 12 noon.   PROBIOTIC-10 PO Take by mouth.       Outpatient follow up: 2 week video visit and 6 wk postpartum visit in office. Postpartum contraception: Condoms   Discharged Condition: good  Discharged to: home  Newborn Data: Disposition:home with mother  Apgars: APGAR (1 MIN): 8   APGAR (5 MINS): 9   APGAR (10 MINS):    Baby Feeding: Breast    Philip Aspen, CNM  10/11/2021 4:46 PM

## 2021-10-11 NOTE — Lactation Note (Signed)
This note was copied from a baby's chart. Lactation Consultation Note  Patient Name: Girl Shaelynn Dragos [id number] Date: 10/11/2021 Reason for consult: Initial assessment;Term Age:33 hours  Initial lactation consult with P2 SVD 14hrs ago. Mom has previous breastfeeding experience includes 2 weeks at breast with 5 months pumping and bottle feeding. 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 Mom desires exclusive at breast feedings with this baby while in the hospital and plans to transition to pumping and bottle feeding once at home. Mom, being an experienced pumper does not voice any questions or concerns re: pumping.  Baby appears to be doing well at the breast with pees and poops exceeding expectations for HOL, and mom isn't experiencing any pain or discomfort. Mom is confident in baby's latch with flanged lips, tugging sensation, identifiable swallows, and baby being satisfied after feedings. [facility] Student provided encouragement for mom's feeding plan, encouraged her to call out with any questions/concerns.  Maternal Data Has patient been taught Hand Expression?: Yes Does the patient have breastfeeding experience prior to this delivery?: Yes How long did the patient breastfeed?: 2wk at breast/pump 5 months  Feeding Mother's Current Feeding Choice: Breast Milk  LATCH Score                    Lactation Tools Discussed/Used    Interventions Interventions: Breast feeding basics reviewed;Education;DEBP  Discharge Pump: Personal  Consult Status Consult Status: PRN    Lavonia Drafts 10/11/2021, 10:59 AM

## 2021-10-11 NOTE — Discharge Summary (Signed)
Patient Name: Mackenzie Crawford DOB: June 01, 1986 MRN: [id number]                            Discharge Summary  Date of Admission: 10/10/2021 Date of Discharge: 10/11/2021 Delivering Provider: Philip Aspen   Admitting Diagnosis: Labor and delivery, indication for care [O75.9] at [redacted]w[redacted]d Secondary diagnosis:  Active Problems:   Labor and delivery, indication for care   Mode of Delivery: normal spontaneous vaginal delivery              Discharge diagnosis: Term Pregnancy Delivered      Intrapartum Procedures: epidural   Post partum procedures: rhogam  Complications: none                     Discharge Day SOAP Note:  Progress Note - Vaginal Delivery  Mackenzie Crawford is a 35 y.o. G2P2002 now PP day 1 s/p Vaginal, Spontaneous . Delivery was uncomplicated  Subjective  The patient has the following complaints: has no unusual complaints  Pain is controlled with current medications.   Patient is urinating without difficulty.  She is ambulating well.     Objective  Vital signs: BP 123/79 (BP Location: Right Arm)    Pulse 62    Temp 97.9 F (36.6 C) (Oral)    Resp 18    Ht 5\' 5"  (1.651 m)    Wt 83.4 kg    LMP 01/10/2021 (Exact Date)    SpO2 99%    Breastfeeding Unknown    BMI 30.59 kg/m   Physical Exam: Gen: NAD Fundus Fundal Tone: Firm @ U-1  Lochia Amount: Scant        Data Review Labs: Lab Results  Component Value Date   WBC 12.6 (H) 10/11/2021   HGB 10.2 (L) 10/11/2021   HCT 31.0 (L) 10/11/2021   MCV 86.8 10/11/2021   PLT 187 10/11/2021   CBC Latest Ref Rng & Units 10/11/2021 10/10/2021 07/27/2021  WBC 4.0 - 10.5 K/uL 12.6(H) 14.4(H) 7.4  Hemoglobin 12.0 - 15.0 g/dL 10.2(L) 11.5(L) 11.3  Hematocrit 36.0 - 46.0 % 31.0(L) 34.3(L) 34.3  Platelets 150 - 400 K/uL 187 226 203   B NEG  Edinburgh Score: Edinburgh Postnatal Depression Scale Screening Tool 10/10/2021  I have been able to laugh and see the funny side of things. 0  I have looked forward with  enjoyment to things. 0  I have blamed myself unnecessarily when things went wrong. 0  I have been anxious or worried for no good reason. 0  I have felt scared or panicky for no good reason. 0  Things have been getting on top of me. 0  I have been so unhappy that I have had difficulty sleeping. 0  I have felt sad or miserable. 0  I have been so unhappy that I have been crying. 0  The thought of harming myself has occurred to me. 0  Edinburgh Postnatal Depression Scale Total 0    Assessment/Plan  Active Problems:   Labor and delivery, indication for care    Plan for discharge today.  Discharge Instructions: Per After Visit Summary. Activity: Advance as tolerated. Pelvic rest for 6 weeks.  Also refer to After Visit Summary Diet: Regular Medications: Allergies as of 10/11/2021   No Known Allergies      Medication List     STOP taking these medications    azithromycin 250 MG tablet Commonly known as:  ZITHROMAX       TAKE these medications    acetaminophen 500 MG tablet Commonly known as: TYLENOL Take 1,000 mg by mouth every 6 (six) hours as needed.   MAGNESIUM PO Take by mouth.   prenatal multivitamin Tabs tablet Take 1 tablet by mouth daily at 12 noon.   PROBIOTIC-10 PO Take by mouth.       Outpatient follow up: 2 week video visit and 6 wk postpartum visit in office. Postpartum contraception: Condoms   Discharged Condition: good  Discharged to: home  Newborn Data: Disposition:home with mother  Apgars: APGAR (1 MIN): 8   APGAR (5 MINS): 9   APGAR (10 MINS):    Baby Feeding: Breast    Philip Aspen, CNM  10/11/2021 4:46 PM

## 2021-10-12 ENCOUNTER — Other Ambulatory Visit: Payer: Self-pay

## 2021-10-12 LAB — RHOGAM INJECTION: Unit division: 0

## 2021-10-12 MED ORDER — WITCH HAZEL-GLYCERIN EX PADS: 1.0000 "application " | MEDICATED_PAD | CUTANEOUS | 12 refills | Status: DC | PRN

## 2021-10-23 DIAGNOSIS — C4490 Unspecified malignant neoplasm of skin, unspecified: Secondary | ICD-10-CM

## 2021-10-23 HISTORY — DX: Unspecified malignant neoplasm of skin, unspecified: C44.90

## 2021-10-31 ENCOUNTER — Telehealth (INDEPENDENT_AMBULATORY_CARE_PROVIDER_SITE_OTHER): Payer: Commercial Managed Care - PPO | Admitting: Certified Nurse Midwife

## 2021-10-31 DIAGNOSIS — Z1331 Encounter for screening for depression: Secondary | ICD-10-CM

## 2021-10-31 DIAGNOSIS — Z1332 Encounter for screening for maternal depression: Secondary | ICD-10-CM

## 2021-10-31 NOTE — Progress Notes (Signed)
Virtual Visit via Video Note  I connected with Mackenzie Crawford on 10/31/21 at 11:30 AM EST by a video enabled telemedicine application and verified that I am speaking with the correct person using two identifiers.  Location: Patient: at home Provider: at office   I discussed the limitations of evaluation and management by telemedicine and the availability of in person appointments. The patient expressed understanding and agreed to proceed.  History of Present Illness: G 2P2 SVD 10/10/21  2 wk video visit for evaluation of lactation, postpartum recover, and depression screening  Ob servations/Objective: Doing well, state bleeding is light. Denies any pain. Pt is pumping breast milk and states supply is great. She denies an issues with depression or baby blues. Her partner is home with her on paternity leave for 12 wks.   Assessment and Plan: Edinburgh Postnatal scale 4  Follow Up Instructions: As as scheduled    I discussed the assessment and treatment plan with the patient. The patient was provided an opportunity to ask questions and all were answered. The patient agreed with the plan and demonstrated an understanding of the instructions.   The patient was advised to call back or seek an in-person evaluation if the symptoms worsen or if the condition fails to improve as anticipated.  I provided 7 minutes of non-face-to-face time during this encounter.   Philip Aspen, CNM

## 2021-11-08 ENCOUNTER — Encounter: Payer: Self-pay | Admitting: Certified Nurse Midwife

## 2021-11-08 ENCOUNTER — Other Ambulatory Visit: Payer: Self-pay | Admitting: Certified Nurse Midwife

## 2021-11-08 MED ORDER — VARICELLA VIRUS VACCINE LIVE 1350 PFU/0.5ML IJ SUSR: 0.5000 mL | Freq: Once | INTRAMUSCULAR | 0 refills | Status: AC

## 2021-11-11 ENCOUNTER — Encounter: Payer: Commercial Managed Care - PPO | Admitting: Certified Nurse Midwife

## 2021-11-14 ENCOUNTER — Encounter: Payer: Commercial Managed Care - PPO | Admitting: Certified Nurse Midwife

## 2021-11-22 ENCOUNTER — Ambulatory Visit (INDEPENDENT_AMBULATORY_CARE_PROVIDER_SITE_OTHER): Payer: Commercial Managed Care - PPO | Admitting: Certified Nurse Midwife

## 2021-11-22 ENCOUNTER — Other Ambulatory Visit: Payer: Self-pay

## 2021-11-22 ENCOUNTER — Encounter: Payer: Self-pay | Admitting: Certified Nurse Midwife

## 2021-11-22 ENCOUNTER — Other Ambulatory Visit: Payer: Self-pay | Admitting: Certified Nurse Midwife

## 2021-11-22 NOTE — Progress Notes (Signed)
Subjective:    Mackenzie Crawford is a 36 y.o. G64P2002 Caucasian female who presents for a postpartum visit. She is 6 weeks postpartum following a spontaneous vaginal delivery at 71 gestational weeks. Anesthesia: epidural. I have fully reviewed the prenatal and intrapartum course. Postpartum course has been normal. Baby's course has been normal. Baby is feeding by  pumping/bottle . Bleeding no bleeding. Bowel function is normal. Bladder function is normal. Patient is not sexually active.  . Contraception method is condoms. Postpartum depression screening: negative. Score 2.  Last pap 11/09/2020 and was negative.  The following portions of the patient's history were reviewed and updated as appropriate: allergies, current medications, past medical history, past surgical history and problem list.  Review of Systems Pertinent items are noted in HPI.   Vitals:   11/22/21 0927  BP: 110/78  Pulse: 81  Weight: 161 lb 12.8 oz (73.4 kg)  Height: 5\' 5"  (1.651 m)   No LMP recorded (lmp unknown).  Objective:   General:  alert, cooperative and no distress   Breasts:  deferred, no complaints  Lungs: clear to auscultation bilaterally  Heart:  regular rate and rhythm  Abdomen: soft, nontender   Vulva: normal  Vagina: normal vagina  Cervix:  closed  Corpus: Well-involuted  Adnexa:  Non-palpable  Rectal Exam: no hemorrhoids        Assessment:   Postpartum exam 6 wks s/p SVD Pumping breast feeding Depression screening Contraception counseling   Plan:  : condoms Follow up in: 6 months for annual or earlier if needed  Philip Aspen, CNM

## 2022-04-12 ENCOUNTER — Encounter: Payer: Self-pay | Admitting: Dermatology

## 2022-04-12 ENCOUNTER — Ambulatory Visit (INDEPENDENT_AMBULATORY_CARE_PROVIDER_SITE_OTHER): Payer: 59 | Admitting: Dermatology

## 2022-04-12 DIAGNOSIS — R21 Rash and other nonspecific skin eruption: Secondary | ICD-10-CM

## 2022-04-12 DIAGNOSIS — C44519 Basal cell carcinoma of skin of other part of trunk: Secondary | ICD-10-CM | POA: Diagnosis not present

## 2022-04-12 DIAGNOSIS — Z1283 Encounter for screening for malignant neoplasm of skin: Secondary | ICD-10-CM | POA: Diagnosis not present

## 2022-04-12 DIAGNOSIS — C4491 Basal cell carcinoma of skin, unspecified: Secondary | ICD-10-CM

## 2022-04-12 DIAGNOSIS — D2262 Melanocytic nevi of left upper limb, including shoulder: Secondary | ICD-10-CM | POA: Diagnosis not present

## 2022-04-12 DIAGNOSIS — D492 Neoplasm of unspecified behavior of bone, soft tissue, and skin: Secondary | ICD-10-CM

## 2022-04-12 DIAGNOSIS — D229 Melanocytic nevi, unspecified: Secondary | ICD-10-CM

## 2022-04-12 DIAGNOSIS — D1800 Hemangioma unspecified site: Secondary | ICD-10-CM

## 2022-04-12 DIAGNOSIS — L821 Other seborrheic keratosis: Secondary | ICD-10-CM

## 2022-04-12 DIAGNOSIS — L814 Other melanin hyperpigmentation: Secondary | ICD-10-CM

## 2022-04-12 DIAGNOSIS — L578 Other skin changes due to chronic exposure to nonionizing radiation: Secondary | ICD-10-CM

## 2022-04-12 HISTORY — DX: Basal cell carcinoma of skin, unspecified: C44.91

## 2022-04-12 MED ORDER — OPZELURA 1.5 % EX CREA: TOPICAL_CREAM | CUTANEOUS | 1 refills | Status: DC

## 2022-04-12 NOTE — Progress Notes (Signed)
New Patient Visit  Subjective  Mackenzie Crawford is a 36 y.o. female who presents for the following: Rash (New patient here today for itching rash that started at inframammary, bikini line and shin. It has not spread to inner thighs. The itching started after she stopped breastfeeding about 3 months ago. Patient started taking zyrtec and has used Benadryl, essential oils, aquaphor and her husbands eczema medication. ).  Patient feels it could be hormone related. There is not constant itch but when it does itch, it is always in the same areas.   The patient presents for Total-Body Skin Exam (TBSE) for skin cancer screening and mole check.  The patient has spots, moles and lesions to be evaluated, some may be new or changing and the patient has concerns that these could be cancer. Possible fhx of BCC.  The following portions of the chart were reviewed this encounter and updated as appropriate:   Tobacco  Allergies  Meds  Problems  Med Hx  Surg Hx  Fam Hx      Review of Systems:  No other skin or systemic complaints except as noted in HPI or Assessment and Plan.  Objective  Well appearing patient in no apparent distress; mood and affect are within normal limits.  A full examination was performed including scalp, head, eyes, ears, nose, lips, neck, chest, axillae, abdomen, back, buttocks, bilateral upper extremities, bilateral lower extremities, hands, feet, fingers, toes, fingernails, and toenails. All findings within normal limits unless otherwise noted below.  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 Itch at thighs could be  itch related to xerosis/clinically eczema or possibly neurogenic.   Start Opzelura once daily.   Recommend Gold Bond Rapid Relief anti-itch  If patient not improving by 2 weeks, patient will let us know and we will send in Skin Medicinals anti-itch for a trial.  Patient denies change in moisturizers, soap. She switched to free and clear laundry detergent. She has not noticed change during menstrual cycle but has not been paying close attention. Rash and itch appeared after discontinuing breastfeeding.    Ruxolitinib Phosphate (OPZELURA) 1.5 % CREA - Left Inframammary Fold Apply to affected areas once daily  Nevus left palm  Benign-appearing.  Observation.  Call clinic for new or changing lesions.  Recommend daily use of broad spectrum spf 30+ sunscreen to sun-exposed areas.    Neoplasm of skin Left Upper Back  Epidermal / dermal shaving  Lesion diameter (cm):  0.6 Informed consent: discussed and consent obtained   Timeout: patient name, date of birth, surgical site, and procedure verified   Anesthesia: the lesion was anesthetized in a standard fashion   Anesthetic:  1% lidocaine w/ epinephrine 1-100,000 local infiltration Instrument used: flexible razor blade   Hemostasis achieved with: aluminum chloride   Outcome: patient tolerated procedure well   Post-procedure details: wound care instructions given   Additional details:  Mupirocin and a bandage applied  Specimen 1 - Surgical pathology Differential Diagnosis: BCC > melanoma > nevus  Check Margins: No 0.6 cm pink and brown papule with irregular vascular pattern   Lentigines - Scattered tan macules - Due to sun exposure - Benign-appearing,  observe - Recommend daily broad spectrum sunscreen SPF 30+ to sun-exposed areas, reapply every 2 hours as needed. - Call for any changes  Seborrheic Keratoses - Stuck-on, waxy, tan-brown papules and/or plaques  - Benign-appearing - Discussed benign etiology and prognosis. -  Observe - Call for any changes  Melanocytic Nevi - Tan-brown and/or pink-flesh-colored symmetric macules and papules - Benign appearing on exam today - Observation - Call clinic for new or changing moles - Recommend daily use of broad spectrum spf 30+ sunscreen to sun-exposed areas.   Hemangiomas - Red papules - Discussed benign nature - Observe - Call for any changes  Actinic Damage - Chronic condition, secondary to cumulative UV/sun exposure - diffuse scaly erythematous macules with underlying dyspigmentation - Recommend daily broad spectrum sunscreen SPF 30+ to sun-exposed areas, reapply every 2 hours as needed.  - Staying in the shade or wearing long sleeves, sun glasses (UVA+UVB protection) and wide brim hats (4-inch brim around the entire circumference of the hat) are also recommended for sun protection.  - Call for new or changing lesions.  Skin cancer screening performed today.  Return for 4-6 weeks rash/itch follow up.  Graciella Belton, RMA, am acting as scribe for Forest Gleason, MD .  Documentation: I have reviewed the above documentation for accuracy and completeness, and I agree with the above.  Forest Gleason, MD

## 2022-04-12 NOTE — Patient Instructions (Addendum)
Recommend taking Heliocare sun protection supplement daily in sunny weather for additional sun protection. For maximum protection on the sunniest days, you can take up to 2 capsules of regular Heliocare OR take 1 capsule of Heliocare Ultra. For prolonged exposure (such as a full day in the sun), you can repeat your dose of the supplement 4 hours after your first dose. Heliocare can be purchased at [facility], at some [facility] or at www.heliocare.com.     Wound Care Instructions  Cleanse wound gently with soap and water once a day then pat dry with clean gauze. Apply a thing coat of Petrolatum (petroleum jelly, "Vaseline") over the wound (unless you have an allergy to this). We recommend that you use a new, sterile tube of Vaseline. Do not pick or remove scabs. Do not remove the yellow or white "healing tissue" from the base of the wound.  Cover the wound with fresh, clean, nonstick gauze and secure with paper tape. You may use Band-Aids in place of gauze and tape if the would is small enough, but would recommend trimming much of the tape off as there is often too much. Sometimes Band-Aids can irritate the skin.  You should call the office for your biopsy report after 1 week if you have not already been contacted.  If you experience any problems, such as abnormal amounts of bleeding, swelling, significant bruising, significant pain, or evidence of infection, please call the office immediately.  FOR ADULT SURGERY PATIENTS: If you need something for pain relief you may take 1 extra strength Tylenol (acetaminophen) AND 2 Ibuprofen (200mg each) together every 4 hours as needed for pain. (do not take these if you are allergic to them or if you have a reason you should not take them.) Typically, you may only need pain medication for 1 to 3 days.     Melanoma ABCDEs  Melanoma is the most dangerous type of skin cancer, and is the leading cause of death from skin disease.  You are more likely to  develop melanoma if you: Have light-colored skin, light-colored eyes, or red or blond hair Spend a lot of time in the sun Tan regularly, either outdoors or in a tanning bed Have had blistering sunburns, especially during childhood Have a close family member who has had a melanoma Have atypical moles or large birthmarks  Early detection of melanoma is key since treatment is typically straightforward and cure rates are extremely high if we catch it early.   The first sign of melanoma is often a change in a mole or a new dark spot.  The ABCDE system is a way of remembering the signs of melanoma.  A for asymmetry:  The two halves do not match. B for border:  The edges of the growth are irregular. C for color:  A mixture of colors are present instead of an even brown color. D for diameter:  Melanomas are usually (but not always) greater than 6mm - the size of a pencil eraser. E for evolution:  The spot keeps changing in size, shape, and color.  Please check your skin once per month between visits. You can use a small mirror in front and a large mirror behind you to keep an eye on the back side or your body.   If you see any new or changing lesions before your next follow-up, please call to schedule a visit.  Please continue daily skin protection including broad spectrum sunscreen SPF 30+ to sun-exposed areas, reapplying every   as needed when you're outdoors.    Due to recent changes in healthcare laws, you may see results of your pathology and/or laboratory studies on MyChart before the doctors have had a chance to review them. We understand that in some cases there may be results that are confusing or concerning to you. Please understand that not all results are received at the same time and often the doctors may need to interpret multiple results in order to provide you with the best plan of care or course of treatment. Therefore, we ask that you please give [place] 2 business days to  thoroughly review all your results before contacting the office for clarification. Should we see a critical lab result, you will be contacted sooner.   If You Need Anything After Your Visit  If you have any questions or concerns for your doctor, please call our main line at [phone] and press option 4 to reach your doctor's medical assistant. If no one answers, please leave a voicemail as directed and we will return your call as soon as possible. Messages left after 4 pm will be answered the following business day.   You may also send [place] a message via [place]. We typically respond to MyChart messages within 1-2 business days.  For prescription refills, please ask your pharmacy to contact our office. Our fax number is [phone].  If you have an urgent issue when the clinic is closed that cannot wait until the next business day, you can page your doctor at the number below.    Please note that while we do our best to be available for urgent issues outside of office hours, we are not available 24/7.   If you have an urgent issue and are unable to reach [place], you may choose to seek medical care at your doctor's office, retail clinic, urgent care center, or emergency room.  If you have a medical emergency, please immediately call 911 or go to the emergency department.  Pager Numbers  - Dr. Nehemiah Massed: [phone]  - Dr. Laurence Ferrari: [phone]  - Dr. Remedy Kindred: [phone]  In the event of inclement weather, please call our main line at [phone] for an update on the status of any delays or closures.  Dermatology Medication Tips: Please keep the boxes that topical medications come in in order to help keep track of the instructions about where and how to use these. Pharmacies typically print the medication instructions only on the boxes and not directly on the medication tubes.   If your medication is too expensive, please contact our office at [phone] option 4 or send [place] a message  through [place].   We are unable to tell what your co-pay for medications will be in advance as this is different depending on your insurance coverage. However, we may be able to find a substitute medication at lower cost or fill out paperwork to get insurance to cover a needed medication.   If a prior authorization is required to get your medication covered by your insurance company, please allow [place] 1-2 business days to complete this process.  Drug prices often vary depending on where the prescription is filled and some pharmacies may offer cheaper prices.  The website www.goodrx.com contains coupons for medications through different pharmacies. The prices here do not account for what the cost may be with help from insurance (it may be cheaper with your insurance), but the website can give you the price if you did not use any insurance.  - You  can print the associated coupon and take it with your prescription to the pharmacy.  - You may also stop by our office during regular business hours and pick up a GoodRx coupon card.  - If you need your prescription sent electronically to a different pharmacy, notify our office through [facility] or by phone at [phone] option 4.     Si Usted Necesita Algo Despus de Su Visita  Tambin puede enviarnos un mensaje a travs de Pharmacist, community. Por lo general respondemos a los mensajes de MyChart en el transcurso de 1 a 2 das hbiles.  Para renovar recetas, por favor pida a su farmacia que se ponga en contacto con nuestra oficina. Harland Dingwall de fax es [place] [phone].  Si tiene un asunto urgente cuando la clnica est cerrada y que no puede esperar hasta el siguiente da hbil, puede llamar/localizar a su doctor(a) al nmero que aparece a continuacin.   Por favor, tenga en cuenta que aunque hacemos todo lo posible para estar disponibles para asuntos urgentes fuera del horario de [place], no estamos disponibles las 24 horas del da, los 7 das de  la [place].   Si tiene un problema urgente y no puede comunicarse con nosotros, puede optar por buscar atencin mdica  en el consultorio de su doctor(a), en una clnica privada, en un centro de atencin urgente o en una sala de emergencias.  Si tiene Engineering geologist, por favor llame inmediatamente al 911 o vaya a la sala de emergencias.  Nmeros de bper  - Dr. Nehemiah Massed: [phone]  - Dra. Moye: [phone]  - Dra. Taiylor Kindred: [phone]  En caso de inclemencias del [place], por favor llame a Johnsie Kindred principal al [phone] para una actualizacin sobre el [place] de cualquier retraso o cierre.  Consejos para la medicacin en dermatologa: Por favor, guarde las cajas en las que vienen los medicamentos de uso tpico para ayudarle a seguir las instrucciones sobre dnde y cmo usarlos. Las farmacias generalmente imprimen las instrucciones del medicamento slo en las cajas y no directamente en los tubos del [place].   Si su medicamento es muy caro, por favor, pngase en contacto con Zigmund Daniel llamando al [phone] y presione la opcin 4 o envenos un mensaje a travs de Pharmacist, community.   No podemos decirle cul ser su copago por los medicamentos por adelantado ya que esto es diferente dependiendo de la cobertura de su seguro. Sin embargo, es posible que podamos encontrar un medicamento sustituto a Electrical engineer un formulario para que el seguro cubra el medicamento que se considera necesario.   Si se requiere una autorizacin previa para que su compaa de seguros [place] su medicamento, por favor permtanos de 1 a 2 das hbiles para completar este proceso.  Los precios de los medicamentos varan con frecuencia dependiendo del Environmental consultant de dnde se surte la receta y alguna farmacias pueden ofrecer precios ms baratos.  El sitio web www.goodrx.com tiene cupones para medicamentos de Airline pilot. Los precios aqu no tienen en cuenta lo que podra costar con la ayuda  del seguro (puede ser ms barato con su seguro), pero el sitio web puede darle el precio si no utiliz Research scientist (physical sciences).  - Puede imprimir el cupn correspondiente y llevarlo con su receta a la farmacia.  - Tambin puede pasar por nuestra oficina durante el horario de atencin regular y Charity fundraiser una tarjeta de cupones de GoodRx.  - Si necesita que su receta se enve electrnicamente a una farmacia diferente, informe a  nuestra oficina a travs de MyChart de [place] o por telfono llamando al [phone] y presione la opcin 4.

## 2022-04-13 ENCOUNTER — Encounter: Payer: Self-pay | Admitting: Certified Nurse Midwife

## 2022-04-20 ENCOUNTER — Telehealth: Payer: Self-pay

## 2022-04-20 NOTE — Telephone Encounter (Signed)
-----   Message from [place], MD sent at 04/19/2022  1:51 PM EDT ----- Skin , left upper back BASAL CELL CARCINOMA, NODULAR PATTERN, CLOSE TO MARGIN -->  Excision vs ED&C Excision would leave a line scar about 4 times longer than the spot that sometimes spreads a bit wider, particularly on the back. Cure rate is 92-93%. We would send for pathology to confirm it appears to be completely out. She would need to limit exercise and lifting for 2 weeks.  EDC would leave a round depressed whitish scar about the same size as the original lesion.  It is treated here in office in a procedure we call "scrape and burn".  No further pathology would be performed.  About 85% cure rate.  Dr. Jerilynn Mages called and left voicemail.  MAs please call starting 04/20/2022. Thank you!

## 2022-04-20 NOTE — Telephone Encounter (Signed)
Patient advised of BX results. She would like to think about treatment options and call back after the Holiday to schedule. aw

## 2022-04-21 ENCOUNTER — Other Ambulatory Visit: Payer: Self-pay | Admitting: Certified Nurse Midwife

## 2022-04-21 DIAGNOSIS — L299 Pruritus, unspecified: Secondary | ICD-10-CM

## 2022-05-03 ENCOUNTER — Other Ambulatory Visit: Payer: Commercial Managed Care - PPO

## 2022-05-03 DIAGNOSIS — L299 Pruritus, unspecified: Secondary | ICD-10-CM

## 2022-05-04 ENCOUNTER — Encounter: Payer: Self-pay | Admitting: Certified Nurse Midwife

## 2022-05-04 LAB — THYROID PANEL WITH TSH
Free Thyroxine Index: 2.3 (ref 1.2–4.9)
T3 Uptake Ratio: 30 % (ref 24–39)
T4, Total: 7.5 ug/dL (ref 4.5–12.0)
TSH: 1.7 u[IU]/mL (ref 0.450–4.500)

## 2022-05-04 LAB — ESTRADIOL: Estradiol: 50.9 pg/mL

## 2022-05-09 ENCOUNTER — Other Ambulatory Visit: Payer: Self-pay | Admitting: Certified Nurse Midwife

## 2022-05-09 DIAGNOSIS — L299 Pruritus, unspecified: Secondary | ICD-10-CM

## 2022-05-17 ENCOUNTER — Other Ambulatory Visit: Payer: Commercial Managed Care - PPO

## 2022-05-17 ENCOUNTER — Ambulatory Visit (INDEPENDENT_AMBULATORY_CARE_PROVIDER_SITE_OTHER): Payer: Commercial Managed Care - PPO | Admitting: Certified Nurse Midwife

## 2022-05-17 ENCOUNTER — Encounter: Payer: Self-pay | Admitting: Certified Nurse Midwife

## 2022-05-17 VITALS — BP 126/75 | HR 66 | Ht 65.0 in | Wt 164.0 lb

## 2022-05-17 DIAGNOSIS — Z01411 Encounter for gynecological examination (general) (routine) with abnormal findings: Secondary | ICD-10-CM

## 2022-05-17 DIAGNOSIS — L299 Pruritus, unspecified: Secondary | ICD-10-CM | POA: Diagnosis not present

## 2022-05-17 DIAGNOSIS — C4490 Unspecified malignant neoplasm of skin, unspecified: Secondary | ICD-10-CM | POA: Diagnosis not present

## 2022-05-17 DIAGNOSIS — Z01419 Encounter for gynecological examination (general) (routine) without abnormal findings: Secondary | ICD-10-CM

## 2022-05-17 DIAGNOSIS — Z124 Encounter for screening for malignant neoplasm of cervix: Secondary | ICD-10-CM

## 2022-05-17 NOTE — Progress Notes (Signed)
GYNECOLOGY ANNUAL PREVENTATIVE CARE ENCOUNTER NOTE  History:     Mackenzie Crawford is a 36 y.o. G48P2002 female here for a routine annual gynecologic exam.  Current complaints: itching, pt has had and seen derm. Requesting labs for further evaluation. .   Denies abnormal vaginal bleeding, discharge, pelvic pain, problems with intercourse or other gynecologic concerns.     Social Relationship: married Living: spouse and 2 children  Work:  Exercise: starting 3 x wk Smoke/Alcohol/drug use: alcohol socially , denies smoking and drug use.   Gynecologic History Patient's last menstrual period was 05/13/2022 (exact date). Contraception: none Last Pap: 11/09/20. Results were: normal (pt has done annual)  Last mammogram: na ( discussed baseline @ 37 due to family hx).    Upstream - 05/17/22 1122       Pregnancy Intention Screening   Does the patient want to become pregnant in the next year? No    Does the patient's partner want to become pregnant in the next year? No    Would the patient like to discuss contraceptive options today? No      Contraception Wrap Up   Current Method No Method - Other Reason    End Method No Method - Other Reason    Contraception Counseling Provided Yes            The pregnancy intention screening data noted above was reviewed. Potential methods of contraception were discussed. The patient elected to proceed with No Method - Other Reason.  Obstetric History OB History  Gravida Para Term Preterm AB Living  '2 2 2     2  '$ SAB IAB Ectopic Multiple Live Births        0 2    # Outcome Date GA Lbr Len/2nd Weight Sex Delivery Anes PTL Lv  2 Term 10/10/21 [id number] / 00:09 6 lb 3.1 oz (2.81 kg) F Vag-Spont EPI  LIV  1 Term 05/09/19 453w6d3:27 / 01:15 7 lb 7.2 oz (3.38 kg) M Vag-Spont EPI  LIV    Past Medical History:  Diagnosis Date   Anxiety    Arthritis    knees, started in high school   Basal cell carcinoma 04/12/2022   L upper back, EDC vs  Excision   Kidney stones    Pelvic pain    Recurrent streptococcal tonsillitis    Skin cancer 2023    Past Surgical History:  Procedure Laterality Date   TONSILLECTOMY AND ADENOIDECTOMY  2015   WISDOM TOOTH EXTRACTION      Current Outpatient Medications on File Prior to Visit  Medication Sig Dispense Refill   MAGNESIUM PO Take by mouth.     Probiotic Product (PROBIOTIC-10 PO) Take by mouth.     Ruxolitinib Phosphate (OPZELURA) 1.5 % CREA Apply to affected areas once daily 60 g 1   No current facility-administered medications on file prior to visit.    No Known Allergies  Social History:  reports that she quit smoking about 12 years ago. Her smoking use included cigarettes. She has never used smokeless tobacco. She reports that she does not currently use alcohol after a past usage of about 2.0 standard drinks of alcohol per week. She reports that she does not currently use drugs.  Family History  Problem Relation Age of Onset   Breast cancer Mother [id number] Kidney Stones Father    Kidney Stones Brother    Pancreatic cancer Maternal Grandmother    Heart disease Maternal Grandfather [id number]  Arthritis Paternal Grandmother    Diabetes Paternal Grandmother     The following portions of the patient's history were reviewed and updated as appropriate: allergies, current medications, past family history, past medical history, past social history, past surgical history and problem list.  Review of Systems Pertinent items noted in HPI and remainder of comprehensive ROS otherwise negative.  Physical Exam:  BP 126/75   Pulse 66   Ht '5\' 5"'$  (1.651 m)   Wt 164 lb (74.4 kg)   LMP 05/13/2022 (Exact Date)   Breastfeeding No   BMI 27.29 kg/m  CONSTITUTIONAL: Well-developed, well-nourished female in no acute distress.  HENT:  Normocephalic, atraumatic, External right and left ear normal. Oropharynx is clear and moist EYES: Conjunctivae and EOM are normal. Pupils are equal, round, and reactive  to light. No scleral icterus.  NECK: Normal range of motion, supple, no masses.  Normal thyroid.  SKIN: Skin is warm and dry. No rash noted. Not diaphoretic. No erythema. No pallor. MUSCULOSKELETAL: Normal range of motion. No tenderness.  No cyanosis, clubbing, or edema.  2+ distal pulses. NEUROLOGIC: Alert and oriented to person, place, and time. Normal reflexes, muscle tone coordination.  PSYCHIATRIC: Normal mood and affect. Normal behavior. Normal judgment and thought content. CARDIOVASCULAR: Normal heart rate noted, regular rhythm RESPIRATORY: Clear to auscultation bilaterally. Effort and breath sounds normal, no problems with respiration noted. BREASTS: Symmetric in size. No masses, tenderness, skin changes, nipple drainage, or lymphadenopathy bilaterally.  ABDOMEN: Soft, no distention noted.  No tenderness, rebound or guarding.  PELVIC: Normal appearing external genitalia and urethral meatus; normal appearing vaginal mucosa and cervix.  No abnormal discharge noted.  Pap smear obtained.  Pt on cycle , blood present. Normal uterine size, no other palpable masses, no uterine or adnexal tenderness.  .   Assessment and Plan:   Annual GYN exam  .  Pap: Will follow up results of pap smear and manage accordingly. Mammogram : n/a  Labs: c reactive protein, vitamin D-itching Refills: none Referral:  none Routine preventative health maintenance measures emphasized. Please refer to After Visit Summary for other counseling recommendations.      Philip Aspen, CNM [facility]

## 2022-05-18 ENCOUNTER — Encounter: Payer: Self-pay | Admitting: Certified Nurse Midwife

## 2022-05-18 ENCOUNTER — Encounter: Payer: Self-pay | Admitting: Dermatology

## 2022-05-18 ENCOUNTER — Ambulatory Visit (INDEPENDENT_AMBULATORY_CARE_PROVIDER_SITE_OTHER): Payer: Commercial Managed Care - PPO | Admitting: Dermatology

## 2022-05-18 DIAGNOSIS — R21 Rash and other nonspecific skin eruption: Secondary | ICD-10-CM | POA: Diagnosis not present

## 2022-05-18 DIAGNOSIS — L739 Follicular disorder, unspecified: Secondary | ICD-10-CM | POA: Diagnosis not present

## 2022-05-18 DIAGNOSIS — C44519 Basal cell carcinoma of skin of other part of trunk: Secondary | ICD-10-CM

## 2022-05-18 LAB — C-REACTIVE PROTEIN: CRP: <1 mg/L (ref 0–10)

## 2022-05-18 LAB — VITAMIN D 25 HYDROXY (VIT D DEFICIENCY, FRACTURES): Vit D, 25-Hydroxy: 72.4 ng/mL (ref 30.0–100.0)

## 2022-05-18 LAB — CORTISOL: Cortisol: 6.6 ug/dL (ref 6.2–19.4)

## 2022-05-18 LAB — TESTOSTERONE: Testosterone: 26 ng/dL (ref 8–60)

## 2022-05-18 MED ORDER — CLINDAMYCIN PHOSPHATE 1 % EX SOLN: CUTANEOUS | 2 refills | Status: DC

## 2022-05-18 NOTE — Patient Instructions (Addendum)
Scalp: Can use H&S shampoo, Selsun Blue; 2-3 times a week lather on scalp, leave on 5-8 minutes, rinse well.   Clindamycin solution apply twice daily to affected areas on scalp.    Recommend daily broad spectrum sunscreen SPF 30+ to sun-exposed areas, reapply every 2 hours as needed. Call for new or changing lesions.  Staying in the shade or wearing long sleeves, sun glasses (UVA+UVB protection) and wide brim hats (4-inch brim around the entire circumference of the hat) are also recommended for sun protection.      Pre-Operative Instructions  You are scheduled for a surgical procedure at [facility]. We recommend you read the following instructions. If you have any questions or concerns, please call the office at [phone].  Shower and wash the entire body with soap and water the day of your surgery paying special attention to cleansing at and around the planned surgery site.  Avoid aspirin or aspirin containing products at least fourteen (14) days prior to your surgical procedure and for at least one week (7 Days) after your surgical procedure. If you take aspirin on a regular basis for heart disease or history of stroke or for any other reason, we may recommend you continue taking aspirin but please notify us if you take this on a regular basis. Aspirin can cause more bleeding to occur during surgery as well as prolonged bleeding and bruising after surgery.   Avoid other nonsteroidal pain medications at least one week prior to surgery and at least one week prior to your surgery. These include medications such as Ibuprofen (Motrin, Advil and Nuprin), Naprosyn, Voltaren, Relafen, etc. If medications are used for therapeutic reasons, please inform us as they can cause increased bleeding or prolonged bleeding during and bruising after surgical procedures.   Please advise [place] if you are taking any "blood thinner" medications such as Coumadin or Dipyridamole or Plavix or similar  medications. These cause increased bleeding and prolonged bleeding during procedures and bruising after surgical procedures. We may have to consider discontinuing these medications briefly prior to and shortly after your surgery if safe to do so.   Please inform us of all medications you are currently taking. All medications that are taken regularly should be taken the day of surgery as you always do. Nevertheless, we need to be informed of what medications you are taking prior to surgery to know whether they will affect the procedure or cause any complications.   Please inform us of any medication allergies. Also inform us of whether you have allergies to Latex or rubber products or whether you have had any adverse reaction to Lidocaine or Epinephrine.  Please inform us of any prosthetic or artificial body parts such as artificial heart valve, joint replacements, etc., or similar condition that might require preoperative antibiotics.   We recommend avoidance of alcohol at least two weeks prior to surgery and continued avoidance for at least two weeks after surgery.   We recommend discontinuation of tobacco smoking at least two weeks prior to surgery and continued abstinence for at least two weeks after surgery.  Do not plan strenuous exercise, strenuous work or strenuous lifting for approximately four weeks after your surgery.   We request if you are unable to make your scheduled surgical appointment, please call us at least a week in advance or as soon as you are aware of a problem so that we can cancel or reschedule the appointment.   You MAY TAKE TYLENOL (acetaminophen) for pain as it  is not a blood thinner.   PLEASE PLAN TO BE IN TOWN FOR TWO WEEKS FOLLOWING SURGERY, THIS IS IMPORTANT SO YOU CAN BE CHECKED FOR DRESSING CHANGES, SUTURE REMOVAL AND TO MONITOR FOR POSSIBLE COMPLICATIONS.     Gentle Skin Care Guide  1. Bathe no more than once a day.  2. Avoid bathing in hot water  3. Use  a mild soap like Dove, Vanicream, Cetaphil, CeraVe. Can use Lever 2000 or Cetaphil antibacterial soap  4. Use soap only where you need it. On most days, use it under your arms, between your legs, and on your feet. Let the water rinse other areas unless visibly dirty.  5. When you get out of the bath/shower, use a towel to gently blot your skin dry, don't rub it.  6. While your skin is still a little damp, apply a moisturizing cream such as Vanicream, CeraVe, Cetaphil, Eucerin, Sarna lotion or plain Vaseline Jelly. For hands apply Neutrogena [place] Hand Cream or Excipial Hand Cream.  7. Reapply moisturizer any time you start to itch or feel dry.  8. Sometimes using free and clear laundry detergents can be helpful. Fabric softener sheets should be avoided. Downy Free & Gentle liquid, or any liquid fabric softener that is free of dyes and perfumes, it acceptable to use  9. If your doctor has given you prescription creams you may apply moisturizers over them      Due to recent changes in healthcare laws, you may see results of your pathology and/or laboratory studies on MyChart before the doctors have had a chance to review them. We understand that in some cases there may be results that are confusing or concerning to you. Please understand that not all results are received at the same time and often the doctors may need to interpret multiple results in order to provide you with the best plan of care or course of treatment. Therefore, we ask that you please give [place] 2 business days to thoroughly review all your results before contacting the office for clarification. Should we see a critical lab result, you will be contacted sooner.   If You Need Anything After Your Visit  If you have any questions or concerns for your doctor, please call our main line at [phone] and press option 4 to reach your doctor's medical assistant. If no one answers, please leave a voicemail as directed and we will  return your call as soon as possible. Messages left after 4 pm will be answered the following business day.   You may also send [place] a message via [place]. We typically respond to MyChart messages within 1-2 business days.  For prescription refills, please ask your pharmacy to contact our office. Our fax number is [phone].  If you have an urgent issue when the clinic is closed that cannot wait until the next business day, you can page your doctor at the number below.    Please note that while we do our best to be available for urgent issues outside of office hours, we are not available 24/7.   If you have an urgent issue and are unable to reach [place], you may choose to seek medical care at your doctor's office, retail clinic, urgent care center, or emergency room.  If you have a medical emergency, please immediately call 911 or go to the emergency department.  Pager Numbers  - Dr. Nehemiah Massed: [phone]  - Dr. Laurence Ferrari: [phone]  - Dr. Raela Kindred: [phone]  In the event of inclement  weather, please call our main line at [phone] for an update on the status of any delays or closures.  Dermatology Medication Tips: Please keep the boxes that topical medications come in in order to help keep track of the instructions about where and how to use these. Pharmacies typically print the medication instructions only on the boxes and not directly on the medication tubes.   If your medication is too expensive, please contact our office at [phone] option 4 or send [place] a message through [place].   We are unable to tell what your co-pay for medications will be in advance as this is different depending on your insurance coverage. However, we may be able to find a substitute medication at lower cost or fill out paperwork to get insurance to cover a needed medication.   If a prior authorization is required to get your medication covered by your insurance company, please allow [place] 1-2 business  days to complete this process.  Drug prices often vary depending on where the prescription is filled and some pharmacies may offer cheaper prices.  The website www.goodrx.com contains coupons for medications through different pharmacies. The prices here do not account for what the cost may be with help from insurance (it may be cheaper with your insurance), but the website can give you the price if you did not use any insurance.  - You can print the associated coupon and take it with your prescription to the pharmacy.  - You may also stop by our office during regular business hours and pick up a GoodRx coupon card.  - If you need your prescription sent electronically to a different pharmacy, notify our office through [facility] or by phone at [phone] option 4.     Si Usted Necesita Algo Despus de Su Visita  Tambin puede enviarnos un mensaje a travs de Pharmacist, community. Por lo general respondemos a los mensajes de MyChart en el transcurso de 1 a 2 das hbiles.  Para renovar recetas, por favor pida a su farmacia que se ponga en contacto con nuestra oficina. Harland Dingwall de fax es [place] [phone].  Si tiene un asunto urgente cuando la clnica est cerrada y que no puede esperar hasta el siguiente da hbil, puede llamar/localizar a su doctor(a) al nmero que aparece a continuacin.   Por favor, tenga en cuenta que aunque hacemos todo lo posible para estar disponibles para asuntos urgentes fuera del horario de [place], no estamos disponibles las 24 horas del da, los 7 das de la [place].   Si tiene un problema urgente y no puede comunicarse con nosotros, puede optar por buscar atencin mdica  en el consultorio de su doctor(a), en una clnica privada, en un centro de atencin urgente o en una sala de emergencias.  Si tiene Engineering geologist, por favor llame inmediatamente al 911 o vaya a la sala de emergencias.  Nmeros de bper  - Dr. Nehemiah Massed: [phone]  - Dra. Moye:  [phone]  - Dra. Athanasia Kindred: [phone]  En caso de inclemencias del [place], por favor llame a Johnsie Kindred principal al [phone] para una actualizacin sobre el [place] de cualquier retraso o cierre.  Consejos para la medicacin en dermatologa: Por favor, guarde las cajas en las que vienen los medicamentos de uso tpico para ayudarle a seguir las instrucciones sobre dnde y cmo usarlos. Las farmacias generalmente imprimen las instrucciones del medicamento slo en las cajas y no directamente en los tubos del [place].   Si su medicamento es [employer]  caro, por favor, pngase en contacto con nuestra oficina llamando al [phone] y presione la opcin 4 o envenos un mensaje a travs de Pharmacist, community.   No podemos decirle cul ser su copago por los medicamentos por adelantado ya que esto es diferente dependiendo de la cobertura de su seguro. Sin embargo, es posible que podamos encontrar un medicamento sustituto a Electrical engineer un formulario para que el seguro cubra el medicamento que se considera necesario.   Si se requiere una autorizacin previa para que su compaa de seguros [place] su medicamento, por favor permtanos de 1 a 2 das hbiles para completar este proceso.  Los precios de los medicamentos varan con frecuencia dependiendo del Environmental consultant de dnde se surte la receta y alguna farmacias pueden ofrecer precios ms baratos.  El sitio web www.goodrx.com tiene cupones para medicamentos de Airline pilot. Los precios aqu no tienen en cuenta lo que podra costar con la ayuda del seguro (puede ser ms barato con su seguro), pero el sitio web puede darle el precio si no utiliz Research scientist (physical sciences).  - Puede imprimir el cupn correspondiente y llevarlo con su receta a la farmacia.  - Tambin puede pasar por nuestra oficina durante el horario de atencin regular y Charity fundraiser una tarjeta de cupones de GoodRx.  - Si necesita que su receta se enve electrnicamente a una farmacia diferente,  informe a nuestra oficina a travs de MyChart de [place] o por telfono llamando al [phone] y presione la opcin 4.

## 2022-05-18 NOTE — Progress Notes (Signed)
   Follow-Up Visit   Subjective  Mackenzie Crawford is a 36 y.o. female who presents for the following: Rash (4 week recheck. Rash under left breast has resolved. Used Opzelura as directed. Now has itching at posterior hairline at neck. Started ~2 weeks ago) and Skin Cancer (BCC. Left upper back. Bx: 04/12/2022. Has decided to do an excision. She will schedule appointment today).   The following portions of the chart were reviewed this encounter and updated as appropriate:  Tobacco  Allergies  Meds  Problems  Med Hx  Surg Hx  Fam Hx      Review of Systems: No other skin or systemic complaints except as noted in HPI or Assessment and Plan.   Objective  Well appearing patient in no apparent distress; mood and affect are within normal limits.  A focused examination was performed including face, neck, chest and back. Relevant physical exam findings are noted in the Assessment and Plan.  left upper back Pink papule  left inframammary Small patch of faint erythema at left chest. Xerosis.  Occipital Scalp Erythematous follicular based papules   Assessment & Plan  Basal cell carcinoma (BCC) of skin of other part of torso left upper back  Pt prefers excision over ED&C. Schedule appointment for excision.  Rash left inframammary  Favor eczema  Discussed gentle skin care.   Continue Opzelura as directed, as needed for itching.   Related Medications Ruxolitinib Phosphate (OPZELURA) 1.5 % CREA Apply to affected areas once daily  Folliculitis Occipital Scalp  At scalp.   Can use H&S shampoo, Selsun Blue; 2-3 times a week lather on scalp, leave on 5-8 minutes, rinse well.   Start Clindamycin solution apply twice daily to affected areas on scalp.  clindamycin (CLEOCIN T) 1 % external solution - Occipital Scalp Apply once or twice daily to affected areas on scalp   Return for Surgery, next available.  I, Emelia Salisbury, CMA, am acting as scribe for Forest Gleason,  MD.  Documentation: I have reviewed the above documentation for accuracy and completeness, and I agree with the above.  Forest Gleason, MD

## 2022-05-22 ENCOUNTER — Encounter: Payer: Self-pay | Admitting: Dermatology

## 2022-05-31 ENCOUNTER — Encounter: Payer: Self-pay | Admitting: Certified Nurse Midwife

## 2022-05-31 LAB — CYTOLOGY - PAP
Adequacy: ABNORMAL
Comment: NEGATIVE

## 2022-06-07 ENCOUNTER — Encounter: Payer: Self-pay | Admitting: Dermatology

## 2022-06-07 ENCOUNTER — Ambulatory Visit (INDEPENDENT_AMBULATORY_CARE_PROVIDER_SITE_OTHER): Payer: Commercial Managed Care - PPO | Admitting: Dermatology

## 2022-06-07 DIAGNOSIS — C44519 Basal cell carcinoma of skin of other part of trunk: Secondary | ICD-10-CM

## 2022-06-07 DIAGNOSIS — L219 Seborrheic dermatitis, unspecified: Secondary | ICD-10-CM

## 2022-06-07 DIAGNOSIS — D492 Neoplasm of unspecified behavior of bone, soft tissue, and skin: Secondary | ICD-10-CM

## 2022-06-07 MED ORDER — CLOBETASOL PROPIONATE 0.05 % EX SOLN: CUTANEOUS | 2 refills | Status: DC

## 2022-06-07 NOTE — Progress Notes (Signed)
   Follow-Up Visit   Subjective  Mackenzie Crawford is a 36 y.o. female who presents for the following: Procedure (Patient here today for excision of bx proven BCC at left upper back. ).  The following portions of the chart were reviewed this encounter and updated as appropriate:   Tobacco  Allergies  Meds  Problems  Med Hx  Surg Hx  Fam Hx      Review of Systems:  No other skin or systemic complaints except as noted in HPI or Assessment and Plan.  Objective  Well appearing patient in no apparent distress; mood and affect are within normal limits.  A focused examination was performed including back. Relevant physical exam findings are noted in the Assessment and Plan.  Scalp Postauricular erythematous patches   Left Upper Back Pink pearly papule or plaque with arborizing vessels.      Assessment & Plan  Seborrheic dermatitis Scalp  Chronic and persistent condition with duration or expected duration over one year. Condition is bothersome/symptomatic for patient. Currently flared.  Start clobetasol solution 1-2 times daily to aa as needed for itch. Avoid applying to face, groin, and axilla. Use as directed. Long-term use can cause thinning of the skin.  Topical steroids (such as triamcinolone, fluocinolone, fluocinonide, mometasone, clobetasol, halobetasol, betamethasone, hydrocortisone) can cause thinning and lightening of the skin if they are used for too long in the same area. Your physician has selected the right strength medicine for your problem and area affected on the body. Please use your medication only as directed by your physician to prevent side effects.   Seborrheic Dermatitis  -  is a chronic persistent rash characterized by pinkness and scaling most commonly of the mid face but also can occur on the scalp (dandruff), ears; mid chest, mid back and groin.  It tends to be exacerbated by stress and cooler weather.  People who have neurologic disease may experience new  onset or exacerbation of existing seborrheic dermatitis.  The condition is not curable but treatable and can be controlled.   clobetasol (TEMOVATE) 0.05 % external solution - Scalp Apply 1-2 times daily to aa as needed for itch. Avoid applying to face, groin, and axilla. Use as directed. Long-term use can cause thinning of the skin.  Basal cell carcinoma (BCC) of skin of trunk Left Upper Back  Destruction of lesion  Destruction method: electrodesiccation and curettage   Informed consent: discussed and consent obtained   Timeout:  patient name, date of birth, surgical site, and procedure verified Anesthesia: the lesion was anesthetized in a standard fashion   Anesthetic:  1% lidocaine w/ epinephrine 1-100,000 buffered w/ 8.4% NaHCO3 Curettage performed in three different directions: Yes   Electrodesiccation performed over the curetted area: Yes   Curettage cycles:  3 Final wound size (cm):  1.2 Hemostasis achieved with:  electrodesiccation Outcome: patient tolerated procedure well with no complications   Post-procedure details: sterile dressing applied and wound care instructions given   Dressing type: petrolatum    Discussed options of excision vs ED&C. She would have a hard time avoiding lifting as she has young children. Will proceed with ED&C today.   Return in about 6 months (around 12/08/2022) for TBSE, 3 month recheck.  Graciella Belton, RMA, am acting as scribe for Forest Gleason, MD .  Documentation: I have reviewed the above documentation for accuracy and completeness, and I agree with the above.  Forest Gleason, MD

## 2022-06-07 NOTE — Patient Instructions (Addendum)
Start clobetasol solution 1-2 times daily to aa as needed for itch. Avoid applying to face, groin, and axilla. Use as directed. Long-term use can cause thinning of the skin.  Topical steroids (such as triamcinolone, fluocinolone, fluocinonide, mometasone, clobetasol, halobetasol, betamethasone, hydrocortisone) can cause thinning and lightening of the skin if they are used for too long in the same area. Your physician has selected the right strength medicine for your problem and area affected on the body. Please use your medication only as directed by your physician to prevent side effects.   Wound Care Instructions  Cleanse wound gently with soap and water once a day then pat dry with clean gauze. Apply a thin coat of Petrolatum (petroleum jelly, "Vaseline") over the wound (unless you have an allergy to this). We recommend that you use a new, sterile tube of Vaseline. Do not pick or remove scabs. Do not remove the yellow or white "healing tissue" from the base of the wound.  Cover the wound with fresh, clean, nonstick gauze and secure with paper tape. You may use Band-Aids in place of gauze and tape if the wound is small enough, but would recommend trimming much of the tape off as there is often too much. Sometimes Band-Aids can irritate the skin.  You should call the office for your biopsy report after 1 week if you have not already been contacted.  If you experience any problems, such as abnormal amounts of bleeding, swelling, significant bruising, significant pain, or evidence of infection, please call the office immediately.  FOR ADULT SURGERY PATIENTS: If you need something for pain relief you may take 1 extra strength Tylenol (acetaminophen) AND 2 Ibuprofen ('200mg'$  each) together every 4 hours as needed for pain. (do not take these if you are allergic to them or if you have a reason you should not take them.) Typically, you may only need pain medication for 1 to 3 days.    Due to recent changes  in healthcare laws, you may see results of your pathology and/or laboratory studies on MyChart before the doctors have had a chance to review them. We understand that in some cases there may be results that are confusing or concerning to you. Please understand that not all results are received at the same time and often the doctors may need to interpret multiple results in order to provide you with the best plan of care or course of treatment. Therefore, we ask that you please give [place] 2 business days to thoroughly review all your results before contacting the office for clarification. Should we see a critical lab result, you will be contacted sooner.   If You Need Anything After Your Visit  If you have any questions or concerns for your doctor, please call our main line at [phone] and press option 4 to reach your doctor's medical assistant. If no one answers, please leave a voicemail as directed and we will return your call as soon as possible. Messages left after 4 pm will be answered the following business day.   You may also send [place] a message via [place]. We typically respond to MyChart messages within 1-2 business days.  For prescription refills, please ask your pharmacy to contact our office. Our fax number is [phone].  If you have an urgent issue when the clinic is closed that cannot wait until the next business day, you can page your doctor at the number below.    Please note that while we do our best to be  available for urgent issues outside of office hours, we are not available 24/7.   If you have an urgent issue and are unable to reach [place], you may choose to seek medical care at your doctor's office, retail clinic, urgent care center, or emergency room.  If you have a medical emergency, please immediately call 911 or go to the emergency department.  Pager Numbers  - Dr. Nehemiah Massed: [phone]  - Dr. Laurence Ferrari: [phone]  - Dr. Kenetha Kindred: [phone]  In the event of  inclement weather, please call our main line at [phone] for an update on the status of any delays or closures.  Dermatology Medication Tips: Please keep the boxes that topical medications come in in order to help keep track of the instructions about where and how to use these. Pharmacies typically print the medication instructions only on the boxes and not directly on the medication tubes.   If your medication is too expensive, please contact our office at [phone] option 4 or send [place] a message through [place].   We are unable to tell what your co-pay for medications will be in advance as this is different depending on your insurance coverage. However, we may be able to find a substitute medication at lower cost or fill out paperwork to get insurance to cover a needed medication.   If a prior authorization is required to get your medication covered by your insurance company, please allow [place] 1-2 business days to complete this process.  Drug prices often vary depending on where the prescription is filled and some pharmacies may offer cheaper prices.  The website www.goodrx.com contains coupons for medications through different pharmacies. The prices here do not account for what the cost may be with help from insurance (it may be cheaper with your insurance), but the website can give you the price if you did not use any insurance.  - You can print the associated coupon and take it with your prescription to the pharmacy.  - You may also stop by our office during regular business hours and pick up a GoodRx coupon card.  - If you need your prescription sent electronically to a different pharmacy, notify our office through [facility] or by phone at [phone] option 4.     Si Usted Necesita Algo Despus de Su Visita  Tambin puede enviarnos un mensaje a travs de Pharmacist, community. Por lo general respondemos a los mensajes de MyChart en el transcurso de 1 a 2 das hbiles.  Para renovar  recetas, por favor pida a su farmacia que se ponga en contacto con nuestra oficina. Harland Dingwall de fax es [place] [phone].  Si tiene un asunto urgente cuando la clnica est cerrada y que no puede esperar hasta el siguiente da hbil, puede llamar/localizar a su doctor(a) al nmero que aparece a continuacin.   Por favor, tenga en cuenta que aunque hacemos todo lo posible para estar disponibles para asuntos urgentes fuera del horario de [place], no estamos disponibles las 24 horas del da, los 7 das de la [place].   Si tiene un problema urgente y no puede comunicarse con nosotros, puede optar por buscar atencin mdica  en el consultorio de su doctor(a), en una clnica privada, en un centro de atencin urgente o en una sala de emergencias.  Si tiene Engineering geologist, por favor llame inmediatamente al 911 o vaya a la sala de emergencias.  Nmeros de bper  - Dr. Nehemiah Massed: [phone]  - Dra. Moye: [phone]  - Dra. Tonita Kindred:  [phone]  En caso de inclemencias del [place], por favor llame a nuestra lnea principal al [phone] para una actualizacin sobre el [place] de cualquier retraso o cierre.  Consejos para la medicacin en dermatologa: Por favor, guarde las cajas en las que vienen los medicamentos de uso tpico para ayudarle a seguir las instrucciones sobre dnde y cmo usarlos. Las farmacias generalmente imprimen las instrucciones del medicamento slo en las cajas y no directamente en los tubos del [place].   Si su medicamento es muy caro, por favor, pngase en contacto con Zigmund Daniel llamando al [phone] y presione la opcin 4 o envenos un mensaje a travs de Pharmacist, community.   No podemos decirle cul ser su copago por los medicamentos por adelantado ya que esto es diferente dependiendo de la cobertura de su seguro. Sin embargo, es posible que podamos encontrar un medicamento sustituto a Electrical engineer un formulario para que el seguro cubra el medicamento  que se considera necesario.   Si se requiere una autorizacin previa para que su compaa de seguros [place] su medicamento, por favor permtanos de 1 a 2 das hbiles para completar este proceso.  Los precios de los medicamentos varan con frecuencia dependiendo del Environmental consultant de dnde se surte la receta y alguna farmacias pueden ofrecer precios ms baratos.  El sitio web www.goodrx.com tiene cupones para medicamentos de Airline pilot. Los precios aqu no tienen en cuenta lo que podra costar con la ayuda del seguro (puede ser ms barato con su seguro), pero el sitio web puede darle el precio si no utiliz Research scientist (physical sciences).  - Puede imprimir el cupn correspondiente y llevarlo con su receta a la farmacia.  - Tambin puede pasar por nuestra oficina durante el horario de atencin regular y Charity fundraiser una tarjeta de cupones de GoodRx.  - Si necesita que su receta se enve electrnicamente a una farmacia diferente, informe a nuestra oficina a travs de MyChart de [place] o por telfono llamando al [phone] y presione la opcin 4.

## 2022-06-17 ENCOUNTER — Encounter: Payer: Self-pay | Admitting: Dermatology

## 2022-07-26 ENCOUNTER — Ambulatory Visit (INDEPENDENT_AMBULATORY_CARE_PROVIDER_SITE_OTHER): Payer: Self-pay | Admitting: Dermatology

## 2022-07-26 DIAGNOSIS — L988 Other specified disorders of the skin and subcutaneous tissue: Secondary | ICD-10-CM

## 2022-07-26 NOTE — Progress Notes (Signed)
   Follow-Up Visit   Subjective  Mackenzie Crawford is a 36 y.o. 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 ).   The following portions of the chart were reviewed this encounter and updated as appropriate:   Tobacco  Allergies  Meds  Problems  Med Hx  Surg Hx  Fam Hx      Review of Systems:  No other skin or systemic complaints except as noted in HPI or Assessment and Plan.  Objective  Well appearing patient in no apparent distress; mood and affect are within normal limits.  A focused examination was performed including face. Relevant physical exam findings are noted in the Assessment and Plan.  Lips Rhytides and volume loss.                  Assessment & Plan  Elastosis of skin Lips  Botox Lip Flip - 10 units  Will prescribe [facility] Anti-Aging Tretinoin 0.025%/Niacinamide/Vitamin C/Vitamin E/Turmeric/Resveratrol with Hyaluronic Acid. Apply pea sized amount nightly to the entire face.  The patient was advised this is not covered by insurance since it is made by a compounding pharmacy. They will receive an email to check out and the medication will be mailed to their home.   Topical retinoid medications like tretinoin can cause dryness and irritation when first started. Only apply a pea-sized amount to the entire affected area. Avoid applying it around the eyes, edges of mouth and creases at the nose. If you experience irritation, use a good moisturizer first and/or apply the medicine less often. If you are doing well with the medicine, you can increase how often you use it until you are applying every night. Be careful with sun protection while using this medication as it can make you sensitive to the sun. This medicine should not be used by pregnant women.       Filling material injection - Lips Location: See attached image  Informed consent: Discussed risks (infection, pain, bleeding, bruising, swelling,  allergic reaction, paralysis of nearby muscles, eyelid droop, double vision, neck weakness, difficulty breathing, headache, undesirable cosmetic result, and need for additional treatment) and benefits of the procedure, as well as the alternatives.  Informed consent was obtained.  Preparation: The area was cleansed with alcohol.  Procedure Details:  Botox was injected into the dermis with a 30-gauge needle. Pressure applied to any bleeding. Ice packs offered for swelling.  Lot Number:  [id number] Expiration:  09/2024  Total Units Injected:  10  Plan: Tylenol may be used for headache.  Allow 2 weeks before returning to clinic for additional dosing as needed. Patient will call for any problems.    Return for TBSE, as scheduled.  Graciella Belton, RMA, am acting as scribe for Forest Gleason, MD .  Documentation: I have reviewed the above documentation for accuracy and completeness, and I agree with the above.  Forest Gleason, MD

## 2022-07-26 NOTE — Patient Instructions (Addendum)
Instructions for [facility] Medications  One or more of your medications was sent to the [facility] mail order compounding pharmacy. You will receive an email from them and can purchase the medicine through that link. It will then be mailed to your home at the address you confirmed. If for any reason you do not receive an email from them, please check your spam folder. If you still do not find the email, please let us know. [facility] phone number is [phone].  Will prescribe [facility] Anti-Aging Tretinoin 0.025%/Niacinamide/Vitamin C/Vitamin E/Turmeric/Resveratrol with Hyaluronic Acid. Apply pea sized amount nightly to the entire face.  The patient was advised this is not covered by insurance since it is made by a compounding pharmacy. They will receive an email to check out and the medication will be mailed to their home.   Topical retinoid medications like tretinoin can cause dryness and irritation when first started. Only apply a pea-sized amount to the entire affected area. Avoid applying it around the eyes, edges of mouth and creases at the nose. If you experience irritation, use a good moisturizer first and/or apply the medicine less often. If you are doing well with the medicine, you can increase how often you use it until you are applying every night. Be careful with sun protection while using this medication as it can make you sensitive to the sun. This medicine should not be used by pregnant women.   Due to recent changes in healthcare laws, you may see results of your pathology and/or laboratory studies on MyChart before the doctors have had a chance to review them. We understand that in some cases there may be results that are confusing or concerning to you. Please understand that not all results are received at the same time and often the doctors may need to interpret multiple results in order to provide you with the best plan of care or course of treatment. Therefore,  we ask that you please give us 2 business days to thoroughly review all your results before contacting the office for clarification. Should we see a critical lab result, you will be contacted sooner.   If You Need Anything After Your Visit  If you have any questions or concerns for your doctor, please call our main line at [phone] and press option 4 to reach your doctor's medical assistant. If no one answers, please leave a voicemail as directed and we will return your call as soon as possible. Messages left after 4 pm will be answered the following business day.   You may also send us a message via MyChart. We typically respond to MyChart messages within 1-2 business days.  For prescription refills, please ask your pharmacy to contact our office. Our fax number is [phone].  If you have an urgent issue when the clinic is closed that cannot wait until the next business day, you can page your doctor at the number below.    Please note that while we do our best to be available for urgent issues outside of office hours, we are not available 24/7.   If you have an urgent issue and are unable to reach us, you may choose to seek medical care at your doctor's office, retail clinic, urgent care center, or emergency room.  If you have a medical emergency, please immediately call 911 or go to the emergency department.  Pager Numbers  - Dr. Kowalski: [phone]  - Dr. Moye: [phone]  - Dr. Stewart: [phone]  In the   event of inclement weather, please call our main line at [phone] for an update on the status of any delays or closures.  Dermatology Medication Tips: Please keep the boxes that topical medications come in in order to help keep track of the instructions about where and how to use these. Pharmacies typically print the medication instructions only on the boxes and not directly on the medication tubes.   If your medication is too expensive, please contact our  office at [phone] option 4 or send us a message through MyChart.   We are unable to tell what your co-pay for medications will be in advance as this is different depending on your insurance coverage. However, we may be able to find a substitute medication at lower cost or fill out paperwork to get insurance to cover a needed medication.   If a prior authorization is required to get your medication covered by your insurance company, please allow us 1-2 business days to complete this process.  Drug prices often vary depending on where the prescription is filled and some pharmacies may offer cheaper prices.  The website www.goodrx.com contains coupons for medications through different pharmacies. The prices here do not account for what the cost may be with help from insurance (it may be cheaper with your insurance), but the website can give you the price if you did not use any insurance.  - You can print the associated coupon and take it with your prescription to the pharmacy.  - You may also stop by our office during regular business hours and pick up a GoodRx coupon card.  - If you need your prescription sent electronically to a different pharmacy, notify our office through [place] MyChart or by phone at [phone] option 4.     Si Usted Necesita Algo Despus de Su Visita  Tambin puede enviarnos un mensaje a travs de MyChart. Por lo general respondemos a los mensajes de MyChart en el transcurso de 1 a 2 das hbiles.  Para renovar recetas, por favor pida a su farmacia que se ponga en contacto con nuestra oficina. Nuestro nmero de fax es el [phone].  Si tiene un asunto urgente cuando la clnica est cerrada y que no puede esperar hasta el siguiente da hbil, puede llamar/localizar a su doctor(a) al nmero que aparece a continuacin.   Por favor, tenga en cuenta que aunque hacemos todo lo posible para estar disponibles para asuntos urgentes fuera del horario de oficina, no  estamos disponibles las 24 horas del da, los 7 das de la semana.   Si tiene un problema urgente y no puede comunicarse con nosotros, puede optar por buscar atencin mdica  en el consultorio de su doctor(a), en una clnica privada, en un centro de atencin urgente o en una sala de emergencias.  Si tiene una emergencia mdica, por favor llame inmediatamente al 911 o vaya a la sala de emergencias.  Nmeros de bper  - Dr. Kowalski: [phone]  - Dra. Moye: [phone]  - Dra. Stewart: [phone]  En caso de inclemencias del tiempo, por favor llame a nuestra lnea principal al [phone] para una actualizacin sobre el [place] de cualquier retraso o cierre.  Consejos para la medicacin en dermatologa: Por favor, guarde las cajas en las que vienen los medicamentos de uso tpico para ayudarle a seguir las instrucciones sobre dnde y cmo usarlos. Las farmacias generalmente imprimen las instrucciones del medicamento slo en las cajas y no directamente en los tubos del medicamento.   Si su   medicamento es muy caro, por favor, pngase en contacto con nuestra oficina llamando al [phone] y presione la opcin 4 o envenos un mensaje a travs de MyChart.   No podemos decirle cul ser su copago por los medicamentos por adelantado ya que esto es diferente dependiendo de la cobertura de su seguro. Sin embargo, es posible que podamos encontrar un medicamento sustituto a menor costo o llenar un formulario para que el seguro cubra el medicamento que se considera necesario.   Si se requiere una autorizacin previa para que su compaa de seguros cubra su medicamento, por favor permtanos de 1 a 2 das hbiles para completar este proceso.  Los precios de los medicamentos varan con frecuencia dependiendo del lugar de dnde se surte la receta y alguna farmacias pueden ofrecer precios ms baratos.  El sitio web www.goodrx.com tiene cupones para medicamentos de diferentes farmacias. Los precios  aqu no tienen en cuenta lo que podra costar con la ayuda del seguro (puede ser ms barato con su seguro), pero el sitio web puede darle el precio si no utiliz ningn seguro.  - Puede imprimir el cupn correspondiente y llevarlo con su receta a la farmacia.  - Tambin puede pasar por nuestra oficina durante el horario de atencin regular y recoger una tarjeta de cupones de GoodRx.  - Si necesita que su receta se enve electrnicamente a una farmacia diferente, informe a nuestra oficina a travs de MyChart de [place] o por telfono llamando al [phone] y presione la opcin 4.  

## 2022-08-07 ENCOUNTER — Encounter: Payer: Self-pay | Admitting: Certified Nurse Midwife

## 2022-08-09 ENCOUNTER — Encounter: Payer: Self-pay | Admitting: Dermatology

## 2022-09-28 ENCOUNTER — Ambulatory Visit: Payer: Commercial Managed Care - PPO | Admitting: Dermatology

## 2022-11-07 ENCOUNTER — Ambulatory Visit (INDEPENDENT_AMBULATORY_CARE_PROVIDER_SITE_OTHER): Payer: Commercial Managed Care - PPO | Admitting: Dermatology

## 2022-11-07 ENCOUNTER — Encounter: Payer: Self-pay | Admitting: Dermatology

## 2022-11-07 VITALS — BP 115/70 | HR 70

## 2022-11-07 DIAGNOSIS — D2271 Melanocytic nevi of right lower limb, including hip: Secondary | ICD-10-CM

## 2022-11-07 DIAGNOSIS — R21 Rash and other nonspecific skin eruption: Secondary | ICD-10-CM

## 2022-11-07 DIAGNOSIS — L219 Seborrheic dermatitis, unspecified: Secondary | ICD-10-CM | POA: Diagnosis not present

## 2022-11-07 DIAGNOSIS — D229 Melanocytic nevi, unspecified: Secondary | ICD-10-CM

## 2022-11-07 DIAGNOSIS — Z85828 Personal history of other malignant neoplasm of skin: Secondary | ICD-10-CM

## 2022-11-07 DIAGNOSIS — L814 Other melanin hyperpigmentation: Secondary | ICD-10-CM

## 2022-11-07 DIAGNOSIS — L578 Other skin changes due to chronic exposure to nonionizing radiation: Secondary | ICD-10-CM

## 2022-11-07 DIAGNOSIS — L910 Hypertrophic scar: Secondary | ICD-10-CM

## 2022-11-07 DIAGNOSIS — Z1283 Encounter for screening for malignant neoplasm of skin: Secondary | ICD-10-CM | POA: Diagnosis not present

## 2022-11-07 DIAGNOSIS — L821 Other seborrheic keratosis: Secondary | ICD-10-CM

## 2022-11-07 DIAGNOSIS — D225 Melanocytic nevi of trunk: Secondary | ICD-10-CM

## 2022-11-07 DIAGNOSIS — D492 Neoplasm of unspecified behavior of bone, soft tissue, and skin: Secondary | ICD-10-CM

## 2022-11-07 HISTORY — DX: Melanocytic nevi, unspecified: D22.9

## 2022-11-07 MED ORDER — OPZELURA 1.5 % EX CREA: TOPICAL_CREAM | CUTANEOUS | 2 refills | Status: AC

## 2022-11-07 NOTE — Progress Notes (Signed)
Follow-Up Visit   Subjective  Mackenzie Crawford is a 37 y.o. female who presents for the following: Annual Exam (Hx of BCC. C/O thick itchy scar at [facility] site at left upper back).  Scalp doing well. Has rash at groin area.   The patient presents for Total-Body Skin Exam (TBSE) for skin cancer screening and mole check.  The patient has spots, moles and lesions to be evaluated, some may be new or changing and the patient has concerns that these could be cancer.  The following portions of the chart were reviewed this encounter and updated as appropriate:  Tobacco  Allergies  Meds  Problems  Med Hx  Surg Hx  Fam Hx      Review of Systems: No other skin or systemic complaints except as noted in HPI or Assessment and Plan.   Objective  Well appearing patient in no apparent distress; mood and affect are within normal limits.  A full examination was performed including scalp, head, eyes, ears, nose, lips, neck, chest, axillae, abdomen, back, buttocks, bilateral upper extremities, bilateral lower extremities, hands, feet, fingers, toes, fingernails, and toenails. All findings within normal limits unless otherwise noted below.  Left Palm, Right Hip 0.6 cm thin med brown pap with dark brow focus at 12 o'clock at right hip,  without features suspicious for malignancy on dermoscopy   Parallel furrow nevus 0.4 cm at left palm  Right Buttock 0.3 cm irregular med - dark brown thin papule       right inguinal area Scaly erythematous patches  Scalp Clear today  Left Upper Back Firm pink/brown dermal plaque   Assessment & Plan   History of Basal Cell Carcinoma of the Skin. Left upper back. - No evidence of recurrence today - Recommend regular full body skin exams - Recommend daily broad spectrum sunscreen SPF 30+ to sun-exposed areas, reapply every 2 hours as needed.  - Call if any new or changing lesions are noted between office visits   Lentigines - Scattered tan macules -  Due to sun exposure - Benign-appearing, observe - Recommend daily broad spectrum sunscreen SPF 30+ to sun-exposed areas, reapply every 2 hours as needed. - Call for any changes  Seborrheic Keratoses - Stuck-on, waxy, tan-brown papules and/or plaques  - Benign-appearing - Discussed benign etiology and prognosis. - Observe - Call for any changes  Melanocytic Nevi - Tan-brown and/or pink-flesh-colored symmetric macules and papules - Benign appearing on exam today - Observation - Call clinic for new or changing moles - Recommend daily use of broad spectrum spf 30+ sunscreen to sun-exposed areas.   Hemangiomas - Red papules - Discussed benign nature - Observe - Call for any changes  Actinic Damage - Chronic condition, secondary to cumulative UV/sun exposure - diffuse scaly erythematous macules with underlying dyspigmentation - Recommend daily broad spectrum sunscreen SPF 30+ to sun-exposed areas, reapply every 2 hours as needed.  - Staying in the shade or wearing long sleeves, sun glasses (UVA+UVB protection) and wide brim hats (4-inch brim around the entire circumference of the hat) are also recommended for sun protection.  - Call for new or changing lesions.  Skin cancer screening performed today.  Nevus (2) Left Palm; Right Hip  Benign-appearing.  Observation.  Call clinic for new or changing lesions.  Recommend daily use of broad spectrum spf 30+ sunscreen to sun-exposed areas.    Neoplasm of skin Right Buttock  Epidermal / dermal shaving  Lesion diameter (cm):  0.3 Informed consent: discussed and consent obtained  Patient was prepped and draped in usual sterile fashion: Area prepped with alcohol. Anesthesia: the lesion was anesthetized in a standard fashion   Anesthetic:  1% lidocaine w/ epinephrine 1-100,000 buffered w/ 8.4% NaHCO3 Instrument used: flexible razor blade   Hemostasis achieved with: pressure, aluminum chloride and electrodesiccation   Outcome:  patient tolerated procedure well   Post-procedure details: wound care instructions given   Post-procedure details comment:  Ointment and small bandage applied  Specimen 1 - Surgical pathology Differential Diagnosis: R/O atypia  Check Margins: No  Wash with Chlorhexidine wash and use J&J Blister bandaids.   Rash right inguinal area  Favor eczema  Chronic condition with duration or expected duration over one year. Currently well-controlled.  Continue Opzelura cream twice a day as needed to less than 10% of skin  Related Medications Ruxolitinib Phosphate (OPZELURA) 1.5 % CREA Apply to affected areas once daily  Seborrheic dermatitis Scalp  Chronic condition with duration or expected duration over one year. Currently well-controlled.   Use Clobetasol solution 1-2 times daily as needed for flares.  Avoid applying to face, groin, and axilla. Use as directed. Long-term use can cause thinning of the skin.  Topical steroids (such as triamcinolone, fluocinolone, fluocinonide, mometasone, clobetasol, halobetasol, betamethasone, hydrocortisone) can cause thinning and lightening of the skin if they are used for too long in the same area. Your physician has selected the right strength medicine for your problem and area affected on the body. Please use your medication only as directed by your physician to prevent side effects.   Patient states she will call for refills.   Related Medications clobetasol (TEMOVATE) 0.05 % external solution Apply 1-2 times daily to aa as needed for itch. Avoid applying to face, groin, and axilla. Use as directed. Long-term use can cause thinning of the skin.  Hypertrophic scar Left Upper Back  Secondary to [facility] for BCC     Intralesional injection - Left Upper Back Location: left upper back  Informed Consent: Discussed risks (infection, pain, bleeding, bruising, thinning of the skin, loss of skin pigment, lack of resolution, and recurrence of lesion)  and benefits of the procedure, as well as the alternatives. Informed consent was obtained. Preparation: The area was prepared a standard fashion.  Anesthesia: None  Procedure Details: An intralesional injection was performed with Kenalog 10 mg/cc. 0.2 cc in total were injected.  Total number of injections: 2  Plan: The patient was instructed on post-op care. Recommend OTC analgesia as needed for pain.  [place]: [id number] Lot: [id number] Exp: 07/2024    Return in about 6 months (around 05/08/2023) for TBSE, HxBCC.  I, Emelia Salisbury, CMA, am acting as scribe for Forest Gleason, MD.  Documentation: I have reviewed the above documentation for accuracy and completeness, and I agree with the above.  Forest Gleason, MD

## 2022-11-07 NOTE — Patient Instructions (Addendum)
Wound Care Instructions  Cleanse wound gently with soap and water once a day then pat dry with clean gauze. Apply a thin coat of Petrolatum (petroleum jelly, "Vaseline") over the wound (unless you have an allergy to this). We recommend that you use a new, sterile tube of Vaseline. Do not pick or remove scabs. Do not remove the yellow or white "healing tissue" from the base of the wound.  Cover the wound with fresh, clean, nonstick gauze and secure with paper tape. You may use Band-Aids in place of gauze and tape if the wound is small enough, but would recommend trimming much of the tape off as there is often too much. Sometimes Band-Aids can irritate the skin.  You should call the office for your biopsy report after 1 week if you have not already been contacted.  If you experience any problems, such as abnormal amounts of bleeding, swelling, significant bruising, significant pain, or evidence of infection, please call the office immediately.  FOR ADULT SURGERY PATIENTS: If you need something for pain relief you may take 1 extra strength Tylenol (acetaminophen) AND 2 Ibuprofen ('200mg'$  each) together every 4 hours as needed for pain. (do not take these if you are allergic to them or if you have a reason you should not take them.) Typically, you may only need pain medication for 1 to 3 days.    Your prescription was sent to [facility] in [place].  A representative from [employer] will contact you within 2 business hours to verify your address and insurance information to schedule a free delivery. If for any reason you do not receive a phone call from them, please reach out to them. Their phone number is [phone] and their hours are Monday-Friday 9:00 am-5:00 pm.      Recommend daily broad spectrum sunscreen SPF 30+ to sun-exposed areas, reapply every 2 hours as needed. Call for new or changing lesions.  Staying in the shade or wearing long sleeves, sun glasses (UVA+UVB protection) and  wide brim hats (4-inch brim around the entire circumference of the hat) are also recommended for sun protection.    Gentle Skin Care Guide  1. Bathe no more than once a day.  2. Avoid bathing in hot water  3. Use a mild soap like Dove, Vanicream, Cetaphil, CeraVe. Can use Lever 2000 or Cetaphil antibacterial soap  4. Use soap only where you need it. On most days, use it under your arms, between your legs, and on your feet. Let the water rinse other areas unless visibly dirty.  5. When you get out of the bath/shower, use a towel to gently blot your skin dry, don't rub it.  6. While your skin is still a little damp, apply a moisturizing cream such as Vanicream, CeraVe, Cetaphil, Eucerin, Sarna lotion or plain Vaseline Jelly. For hands apply Neutrogena [place] Hand Cream or Excipial Hand Cream.  7. Reapply moisturizer any time you start to itch or feel dry.  8. Sometimes using free and clear laundry detergents can be helpful. Fabric softener sheets should be avoided. Downy Free & Gentle liquid, or any liquid fabric softener that is free of dyes and perfumes, it acceptable to use  9. If your doctor has given you prescription creams you may apply moisturizers over them      Recommend taking Heliocare sun protection supplement daily in sunny weather for additional sun protection. For maximum protection on the sunniest days, you can take up to 2 capsules of regular Heliocare OR  take 1 capsule of Heliocare Ultra. For prolonged exposure (such as a full day in the sun), you can repeat your dose of the supplement 4 hours after your first dose. Heliocare can be purchased at [employer], at some [facility] or at [email or web address].    Melanoma ABCDEs  Melanoma is the most dangerous type of skin cancer, and is the leading cause of death from skin disease.  You are more likely to develop melanoma if you: Have light-colored skin, light-colored eyes, or red or blond hair Spend a lot of time  in the sun Tan regularly, either outdoors or in a tanning bed Have had blistering sunburns, especially during childhood Have a close family member who has had a melanoma Have atypical moles or large birthmarks  Early detection of melanoma is key since treatment is typically straightforward and cure rates are extremely high if we catch it early.   The first sign of melanoma is often a change in a mole or a new dark spot.  The ABCDE system is a way of remembering the signs of melanoma.  A for asymmetry:  The two halves do not match. B for border:  The edges of the growth are irregular. C for color:  A mixture of colors are present instead of an even brown color. D for diameter:  Melanomas are usually (but not always) greater than 56m - the size of a pencil eraser. E for evolution:  The spot keeps changing in size, shape, and color.  Please check your skin once per month between visits. You can use a small mirror in front and a large mirror behind you to keep an eye on the back side or your body.   If you see any new or changing lesions before your next follow-up, please call to schedule a visit.  Please continue daily skin protection including broad spectrum sunscreen SPF 30+ to sun-exposed areas, reapplying every 2 hours as needed when you're outdoors.   Staying in the shade or wearing long sleeves, sun glasses (UVA+UVB protection) and wide brim hats (4-inch brim around the entire circumference of the hat) are also recommended for sun protection.    Due to recent changes in healthcare laws, you may see results of your pathology and/or laboratory studies on MyChart before the doctors have had a chance to review them. We understand that in some cases there may be results that are confusing or concerning to you. Please understand that not all results are received at the same time and often the doctors may need to interpret multiple results in order to provide you with the best plan of care or  course of treatment. Therefore, we ask that you please give u[place]2 business days to thoroughly review all your results before contacting the office for clarification. Should we see a critical lab result, you will be contacted sooner.   If You Need Anything After Your Visit  If you have any questions or concerns for your doctor, please call our main line at [phone] press option 4 to reach your doctor's medical assistant. If no one answers, please leave a voicemail as directed and we will return your call as soon as possible. Messages left after 4 pm will be answered the following business day.   You may also send [place] message via [place] We typically respond to MyChart messages within 1-2 business days.  For prescription refills, please ask your pharmacy to contact our office. Our fax number is [phone]  If  you have an urgent issue when the clinic is closed that cannot wait until the next business day, you can page your doctor at the number below.    Please note that while we do our best to be available for urgent issues outside of office hours, we are not available 24/7.   If you have an urgent issue and are unable to reach [place], you may choose to seek medical care at your doctor's office, retail clinic, urgent care center, or emergency room.  If you have a medical emergency, please immediately call 911 or go to the emergency department.  Pager Numbers  - Dr. Nehemiah Massed: [phone]  - Dr. Laurence Ferrari: [phone]  - Dr. Adelise Kindred: [phone]  In the event of inclement weather, please call our main line at [phone] for an update on the status of any delays or closures.  Dermatology Medication Tips: Please keep the boxes that topical medications come in in order to help keep track of the instructions about where and how to use these. Pharmacies typically print the medication instructions only on the boxes and not directly on the medication tubes.   If your medication is too  expensive, please contact our office at [phone] option 4 or send [place] a message through [place].   We are unable to tell what your co-pay for medications will be in advance as this is different depending on your insurance coverage. However, we may be able to find a substitute medication at lower cost or fill out paperwork to get insurance to cover a needed medication.   If a prior authorization is required to get your medication covered by your insurance company, please allow [place] 1-2 business days to complete this process.  Drug prices often vary depending on where the prescription is filled and some pharmacies may offer cheaper prices.  The website www.goodrx.com contains coupons for medications through different pharmacies. The prices here do not account for what the cost may be with help from insurance (it may be cheaper with your insurance), but the website can give you the price if you did not use any insurance.  - You can print the associated coupon and take it with your prescription to the pharmacy.  - You may also stop by our office during regular business hours and pick up a GoodRx coupon card.  - If you need your prescription sent electronically to a different pharmacy, notify our office through [facility] or by phone at [phone] option 4.     Si Usted Necesita Algo Despus de Su Visita  Tambin puede enviarnos un mensaje a travs de Pharmacist, community. Por lo general respondemos a los mensajes de MyChart en el transcurso de 1 a 2 das hbiles.  Para renovar recetas, por favor pida a su farmacia que se ponga en contacto con nuestra oficina. Harland Dingwall de fax es [place] [phone].  Si tiene un asunto urgente cuando la clnica est cerrada y que no puede esperar hasta el siguiente da hbil, puede llamar/localizar a su doctor(a) al nmero que aparece a continuacin.   Por favor, tenga en cuenta que aunque hacemos todo lo posible para estar disponibles para asuntos urgentes fuera  del horario de [place], no estamos disponibles las 24 horas del da, los 7 das de la [place].   Si tiene un problema urgente y no puede comunicarse con nosotros, puede optar por buscar atencin mdica  en el consultorio de su doctor(a), en una clnica privada, en un centro de atencin urgente o en [employer]  sala de emergencias.  Si tiene Engineering geologist, por favor llame inmediatamente al 911 o vaya a la sala de emergencias.  Nmeros de bper  - Dr. Nehemiah Massed: [phone]  - Dra. Moye: [phone]  - Dra. Ayvah Kindred: [phone]  En caso de inclemencias del [place], por favor llame a Johnsie Kindred principal al [phone] para una actualizacin sobre el [place] de cualquier retraso o cierre.  Consejos para la medicacin en dermatologa: Por favor, guarde las cajas en las que vienen los medicamentos de uso tpico para ayudarle a seguir las instrucciones sobre dnde y cmo usarlos. Las farmacias generalmente imprimen las instrucciones del medicamento slo en las cajas y no directamente en los tubos del [place].   Si su medicamento es muy caro, por favor, pngase en contacto con Zigmund Daniel llamando al [phone] y presione la opcin 4 o envenos un mensaje a travs de Pharmacist, community.   No podemos decirle cul ser su copago por los medicamentos por adelantado ya que esto es diferente dependiendo de la cobertura de su seguro. Sin embargo, es posible que podamos encontrar un medicamento sustituto a Electrical engineer un formulario para que el seguro cubra el medicamento que se considera necesario.   Si se requiere una autorizacin previa para que su compaa de seguros [place] su medicamento, por favor permtanos de 1 a 2 das hbiles para completar este proceso.  Los precios de los medicamentos varan con frecuencia dependiendo del Environmental consultant de dnde se surte la receta y alguna farmacias pueden ofrecer precios ms baratos.  El sitio web www.goodrx.com tiene cupones para medicamentos de Office manager. Los precios aqu no tienen en cuenta lo que podra costar con la ayuda del seguro (puede ser ms barato con su seguro), pero el sitio web puede darle el precio si no utiliz Research scientist (physical sciences).  - Puede imprimir el cupn correspondiente y llevarlo con su receta a la farmacia.  - Tambin puede pasar por nuestra oficina durante el horario de atencin regular y Charity fundraiser una tarjeta de cupones de GoodRx.  - Si necesita que su receta se enve electrnicamente a una farmacia diferente, informe a nuestra oficina a travs de MyChart de [place] o por telfono llamando al [phone] y presione la opcin 4.

## 2022-11-13 ENCOUNTER — Telehealth: Payer: Self-pay

## 2022-11-13 NOTE — Telephone Encounter (Signed)
Left pt msg to call for bx results/sh 

## 2022-11-13 NOTE — Telephone Encounter (Signed)
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  This is a MILDLY ATYPICAL MOLE. On the spectrum from normal mole to melanoma skin cancer, this is in between but it is much closer to a normal mole.  - These typically do not progress to melanoma or cause any trouble.  - People who have a history of atypical moles do have a slightly increased risk of developing melanoma somewhere on the body, so a yearly full body skin exam by a dermatologist is recommended.  - Monthly self skin checks and daily sun protection are also recommended.  - Please call if you notice a dark spot coming back where this biopsy was taken.  - Please also call if you notice any new or changing spots anywhere else on the body before your follow-up visit.  - Please call our office or send [place] a message if you have any questions or concerns about this biopsy result.    MAs please call. Thank you!

## 2022-11-14 ENCOUNTER — Telehealth: Payer: Self-pay

## 2022-11-14 NOTE — Telephone Encounter (Signed)
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  This is a MILDLY ATYPICAL MOLE. On the spectrum from normal mole to melanoma skin cancer, this is in between but it is much closer to a normal mole.  - These typically do not progress to melanoma or cause any trouble.  - People who have a history of atypical moles do have a slightly increased risk of developing melanoma somewhere on the body, so a yearly full body skin exam by a dermatologist is recommended.  - Monthly self skin checks and daily sun protection are also recommended.  - Please call if you notice a dark spot coming back where this biopsy was taken.  - Please also call if you notice any new or changing spots anywhere else on the body before your follow-up visit.  - Please call our office or send [place] a message if you have any questions or concerns about this biopsy result.    MAs please call. Thank you!

## 2022-11-14 NOTE — Telephone Encounter (Signed)
Patient advised of BX results .aw 

## 2023-01-21 IMAGING — US US OB COMP +14 WK
1 series · 13 of 28 positions shown · non-contrast
Comparison: none

CLINICAL DATA: Scan for anatomy. Gravida 2 para 1 at 17 weeks 4
days by EDC of 10/17/2021.

EXAM:
OBSTETRICAL ULTRASOUND >14 WKS

[Series 1: us ob comp + 14 wk · 83 acquisitions, 13 frames shown]
[im 4/83]
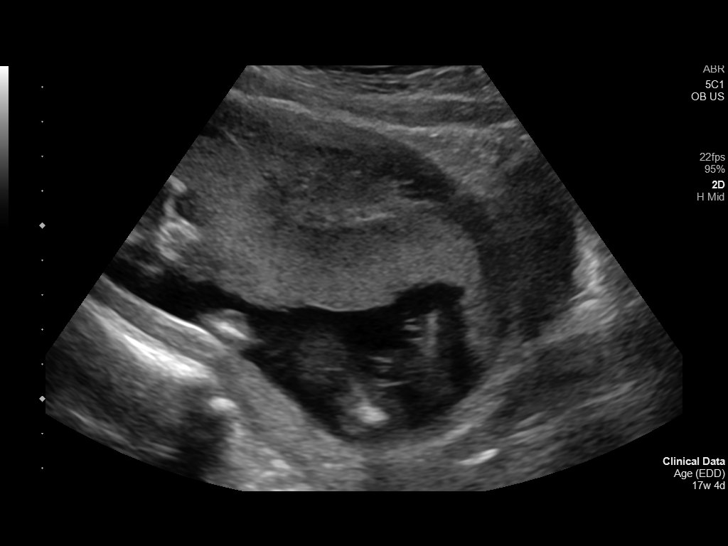
[im 10/83]
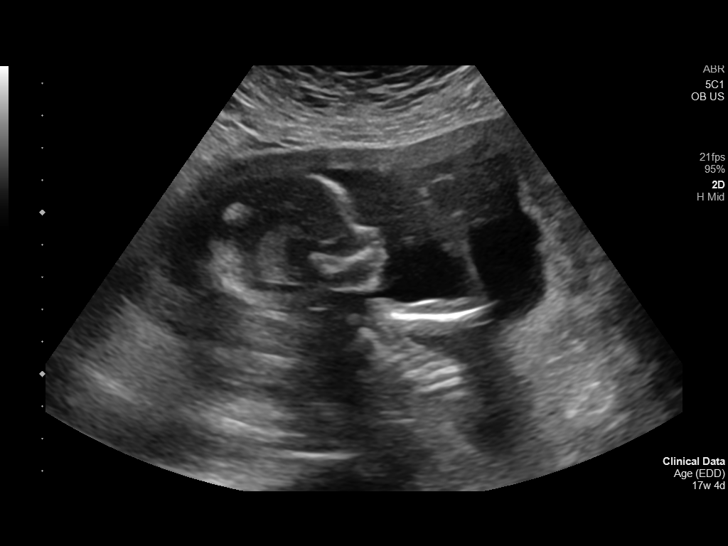
[im 16/83]
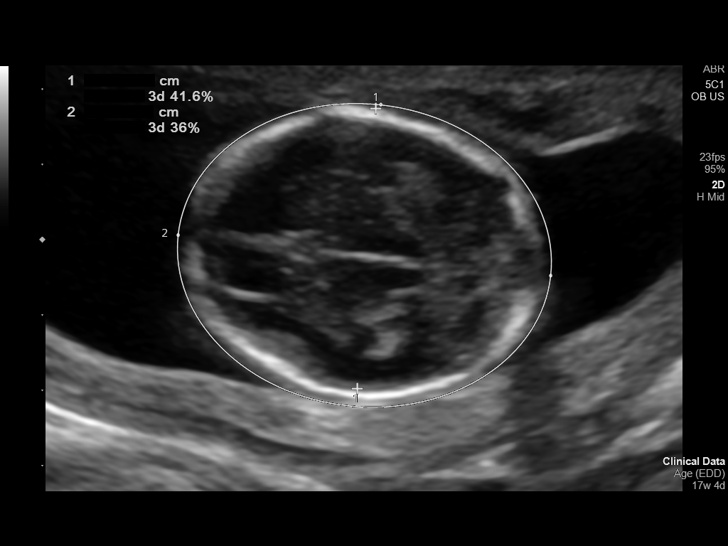
[im 22/83]
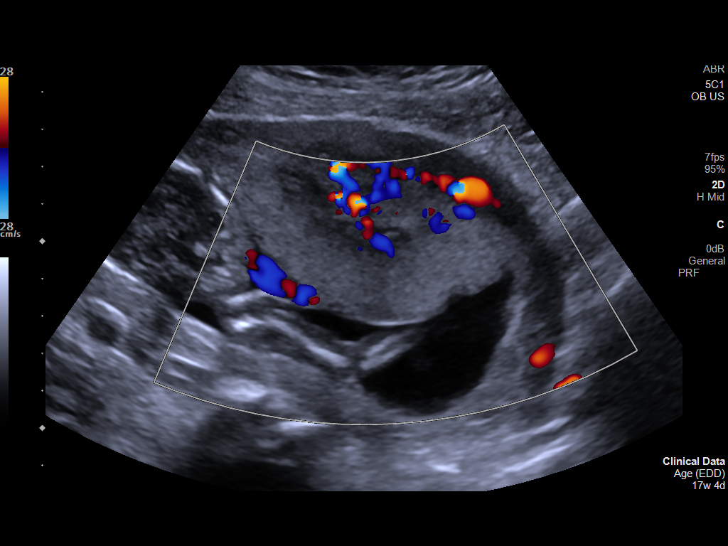
[im 28/83]
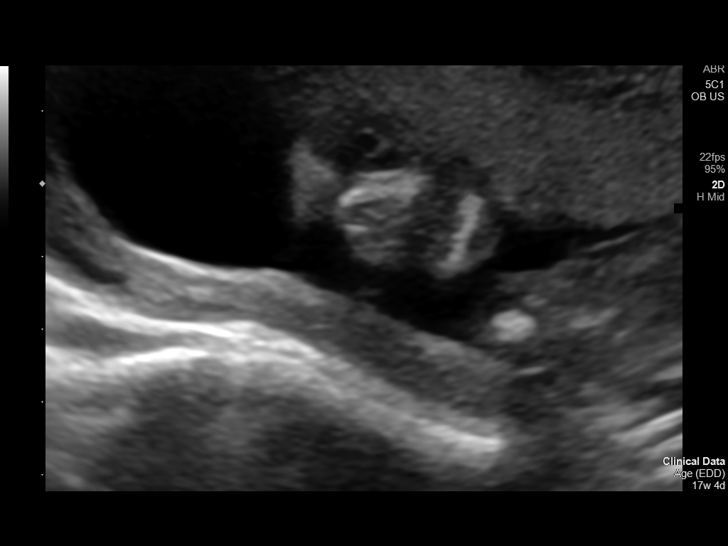
[im 34/83]
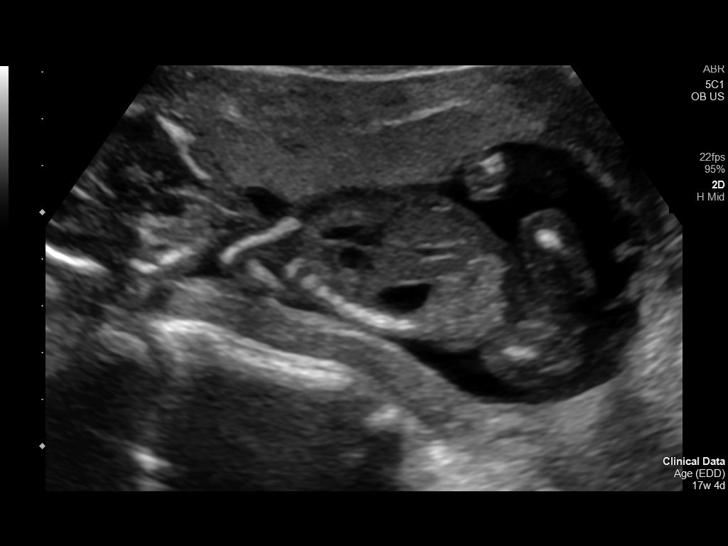
[im 43/83]
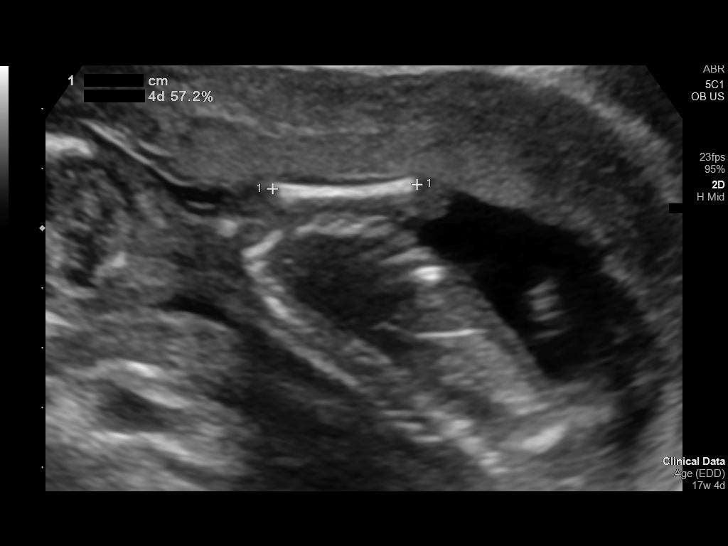
[im 49/83]
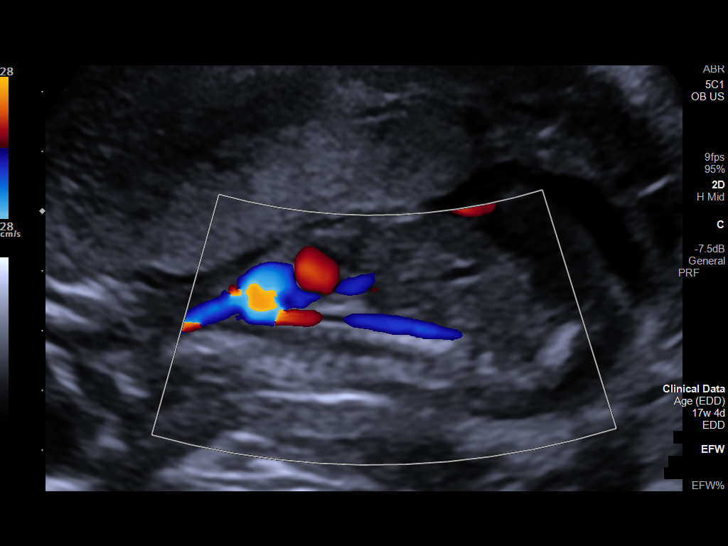
[im 55/83]
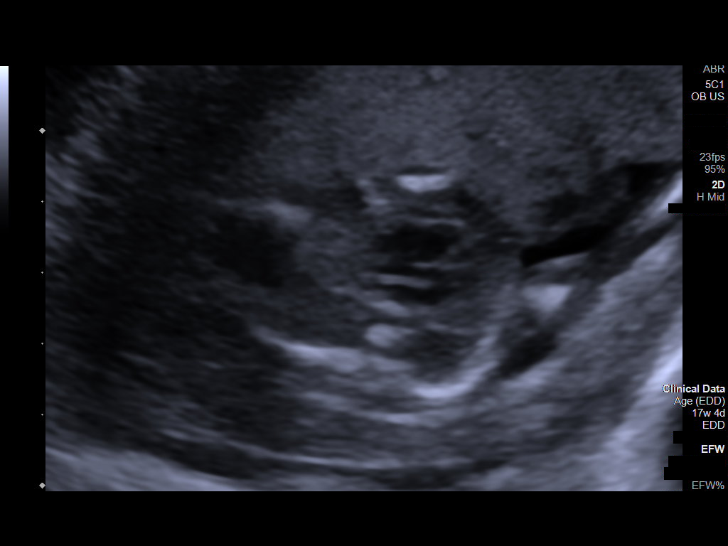
[im 61/83]
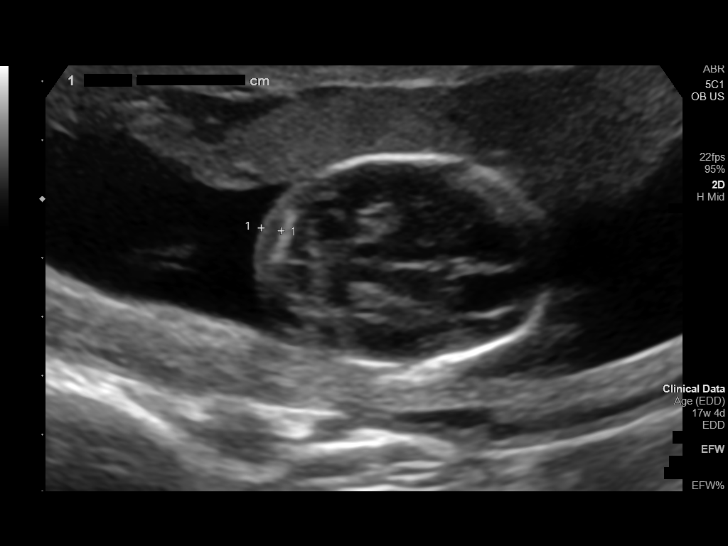
[im 67/83]
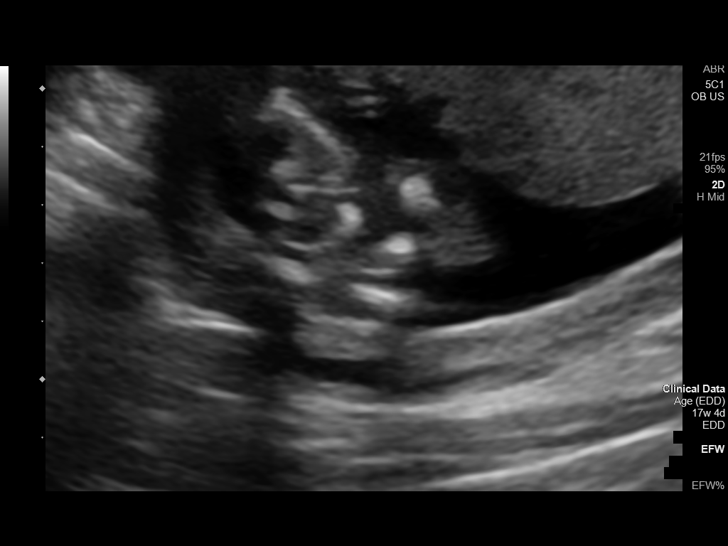
[im 73/83]
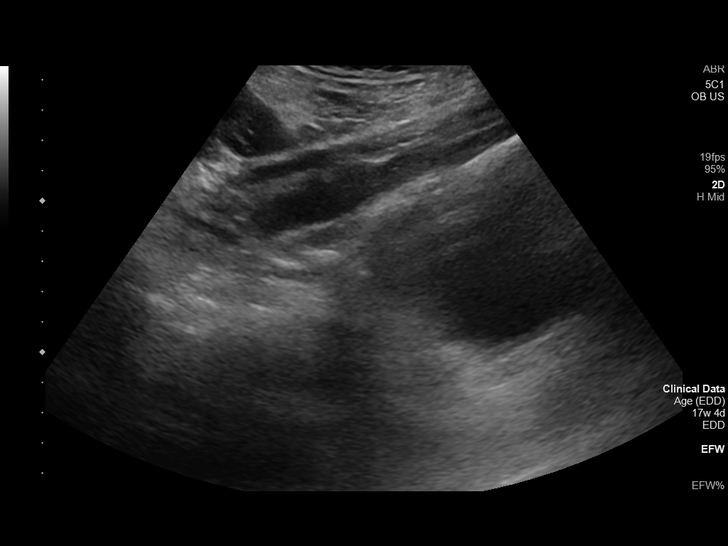
[im 79/83]
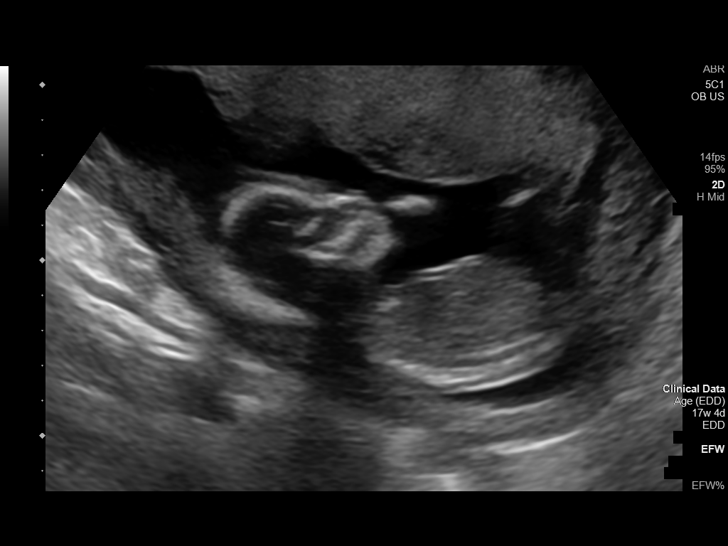

[13 of 28 positions shown; findings below may reference images not displayed]

FINDINGS: Number of Fetuses: 1

Heart Rate:  144 bpm

Movement: Present

Presentation: Breech

Previa: None

Placental Location: Anterior

Amniotic Fluid (Subjective): Normal

Amniotic Fluid (Objective):

Vertical pocket = 3.6cm

FETAL BIOMETRY

BPD: 3.7cm 17w 3d

HC:   14.08cm 17w 3d

AC:   11.39cm 17w 1d

FL:   2.52cm 17w 4d

Current Mean GA: 17w 3d US EDC: 10/18/2021

Assigned GA:  17w 4d Assigned EDC: 10/17/2021

FETAL ANATOMY

Lateral Ventricles: Appears normal

Thalami/CSP: Appears normal

Posterior Fossa:  Appears normal

Nuchal Region: Appears normal   NFT= 3 millimeters

Upper Lip: Appears normal

Spine: Appears normal

4 Chamber Heart on Left: Not visualized

LVOT: Previously seen

RVOT: Previously seen

Stomach on Left: Appears normal

3 Vessel Cord: Appears normal

Cord Insertion site: Appears normal

Kidneys: Not visualized

Bladder: Appears normal

Extremities: Appears normal

Technically difficult due to: Early gestational age

Maternal Findings:

Cervix:  3.2 centimeters on transabdominal evaluation
IMPRESSION: 1. Single living intrauterine fetus in breech presentation.
2. Size and dates correlate well.
3. Normal amniotic fluid volume.
4. No fetal anomalies are identified. The heart and kidneys are not
well evaluated due to early gestational age. Recommend follow-up.

## 2023-02-07 IMAGING — US US OB FOLLOW-UP
1 series · 15 of 28 positions shown · non-contrast
Comparison: none

CLINICAL DATA: Pregnancy.  Follow-up fetal anatomy

EXAM:
OBSTETRIC 14+ WK ULTRASOUND FOLLOW-UP

[Series 1: us ob follow up · 15 of 63 slices shown]
[im 1/63]
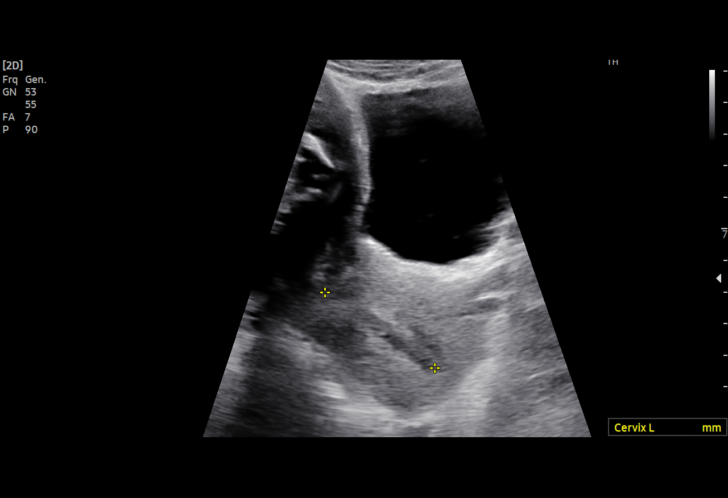
[im 5/63]
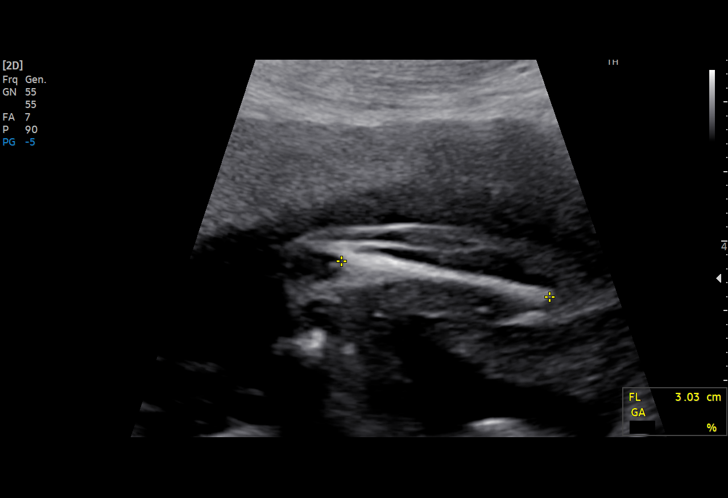
[im 10/63]
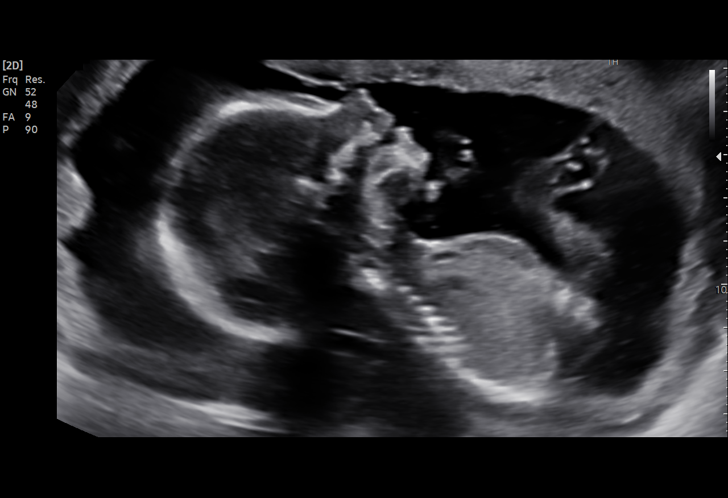
[im 14/63]
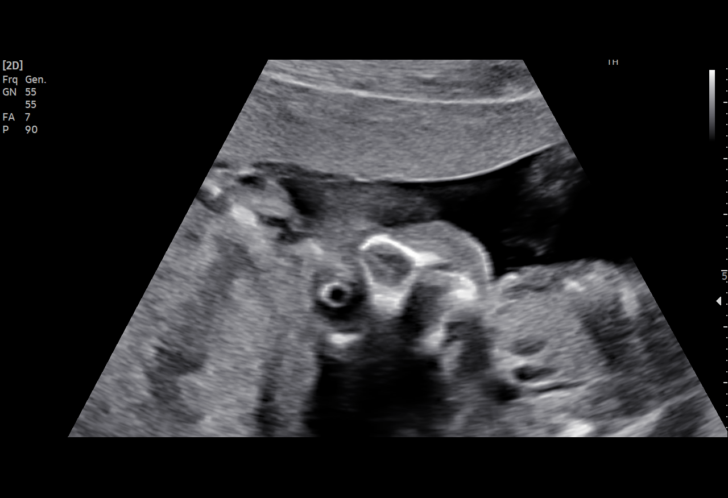
[im 19/63]
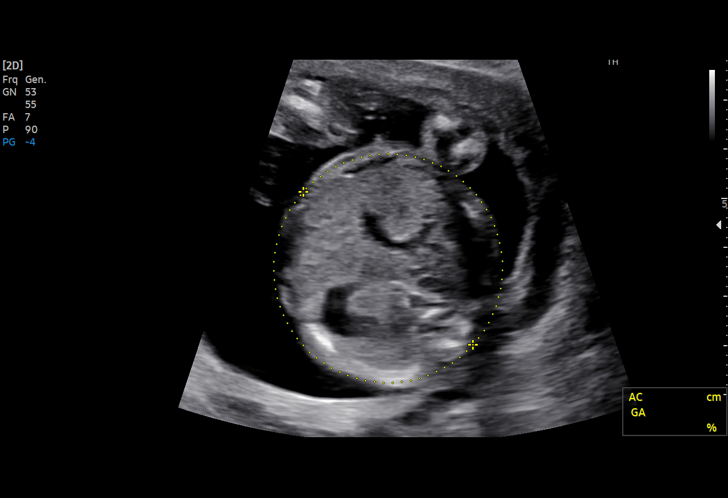
[im 23/63]
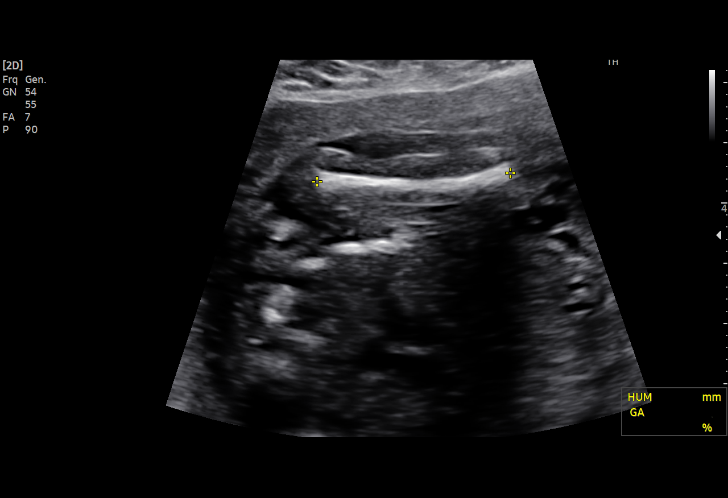
[im 28/63]
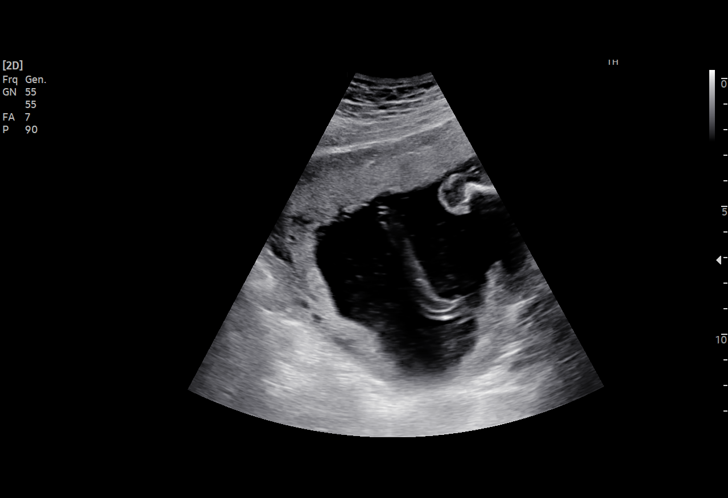
[im 33/63]
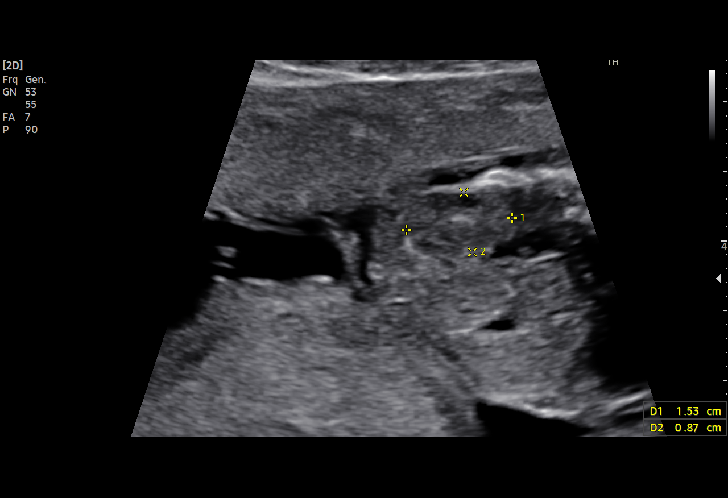
[im 35/63]
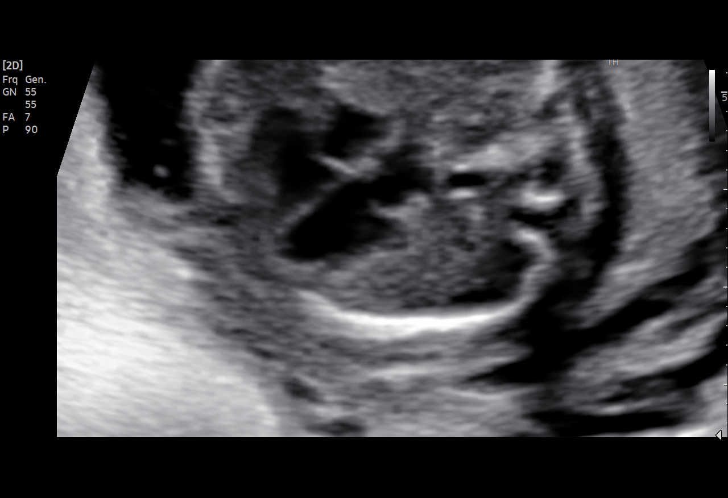
[im 40/63]
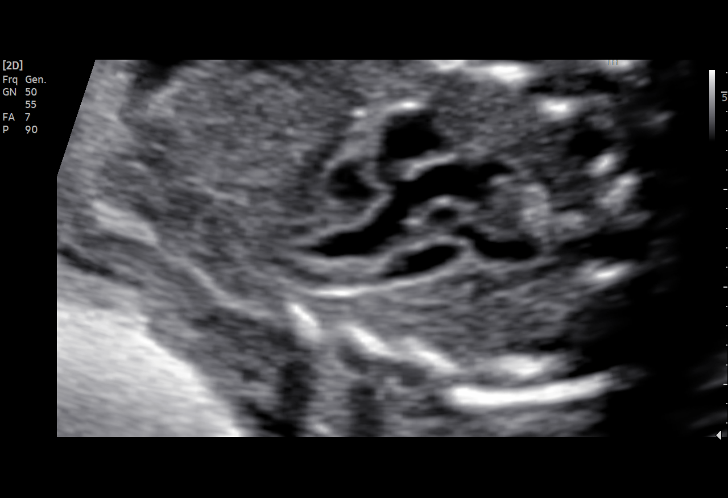
[im 44/63]
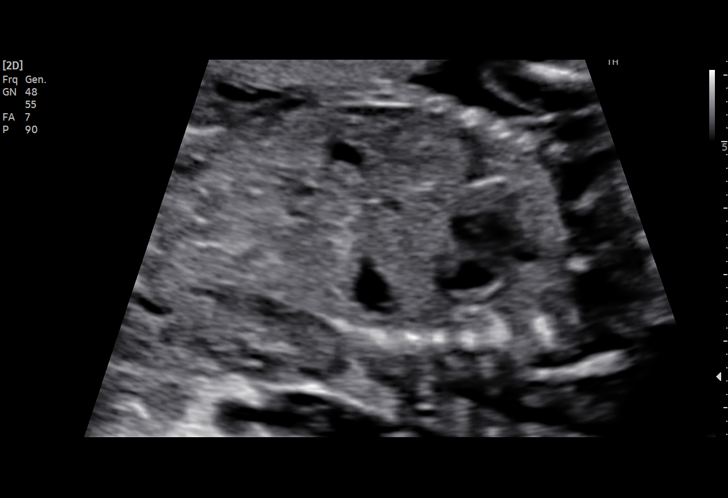
[im 49/63]
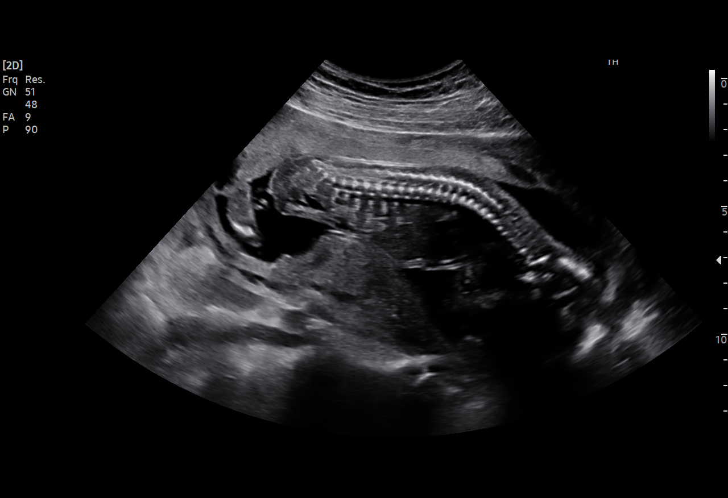
[im 53/63]
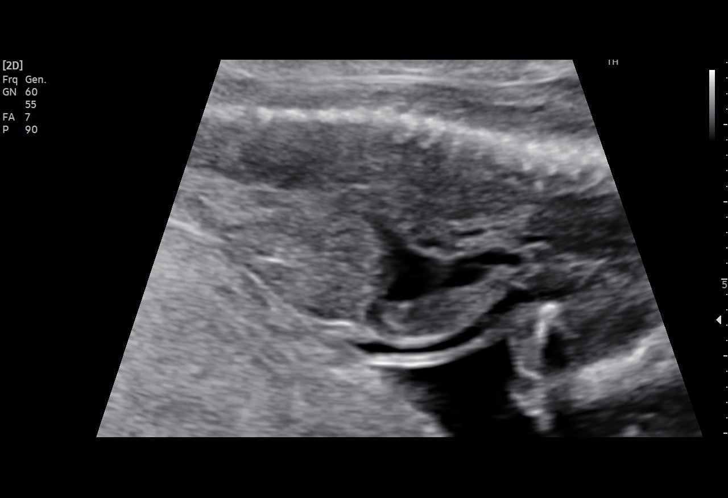
[im 58/63]
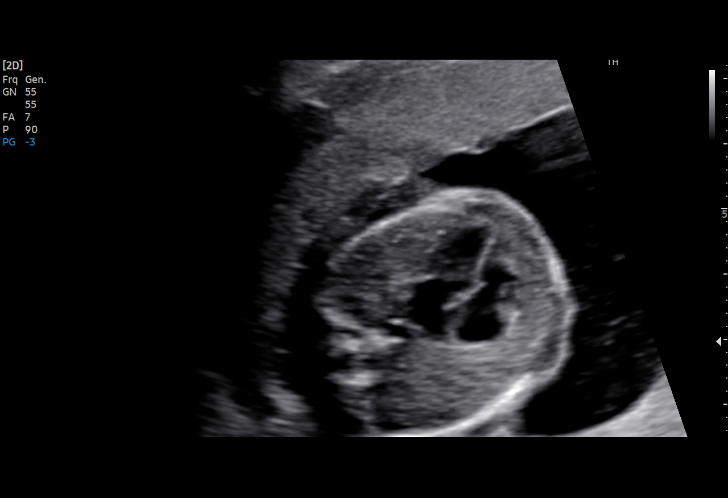
[im 63/63]
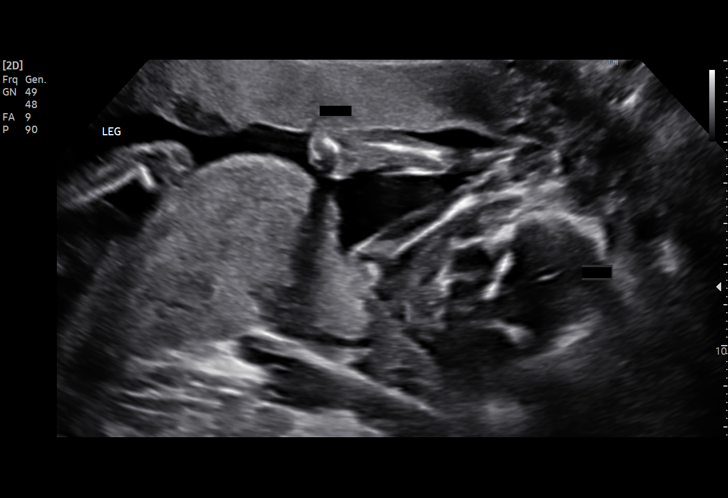

[15 of 28 positions shown; findings below may reference images not displayed]

FINDINGS: Number of Fetuses: 1

Heart Rate:  133 bpm

Movement: Yes

Presentation: Variable

Previa: No

Placental Location: Anterior

Amniotic Fluid (Subjective): Normal

Amniotic Fluid (Objective):

Vertical pocket 6.0cm

FETAL BIOMETRY

BPD:  4.3cm 19w 1d

HC:    16.3cm 19w 1d

AC:    14.8cm 20w 0d

FL:    3.2cm 19w 5d

Current Mean GA: 20w 0d US EDC: 10/17/2021

Assigned GA: 20w 0d Assigned EDC: 10/17/2021

Estimated Fetal Weight:  314g

FETAL ANATOMY

Lateral Ventricles: Previously seen

Thalami/CSP: Previously seen

Posterior Fossa: Previously seen

Nuchal Region: Previously seen

Upper Lip: Previously seen

Spine: Previously seen

4 Chamber Heart on Left: Appears normal

LVOT: Appears normal

RVOT: Appears normal

Stomach on Left: Previously seen

3 Vessel Cord: Previously seen

Cord Insertion site: Previously seen

Kidneys: Appears normal

Bladder: Previously seen

Extremities: Previously seen

Sex: Previously Seen

Technical Limitations: None

Maternal Findings:

Cervix:  Closed.  4.4 cm.
IMPRESSION: 1. Single live intrauterine gestation in variable presentation.
2. Assigned gestational age of 20 weeks 0 days. Adequate interval
growth.
3. Adequate visualization of the fetal cardiac anatomy and kidneys.
Fetal anatomic survey is now complete. No fetal anomalies detected.

## 2023-03-22 ENCOUNTER — Encounter: Payer: Self-pay | Admitting: Family

## 2023-03-22 ENCOUNTER — Ambulatory Visit (INDEPENDENT_AMBULATORY_CARE_PROVIDER_SITE_OTHER): Payer: Commercial Managed Care - PPO | Admitting: Family

## 2023-03-22 VITALS — BP 124/86 | HR 65 | Temp 98.2°F | Ht 66.0 in | Wt 168.5 lb

## 2023-03-22 DIAGNOSIS — N946 Dysmenorrhea, unspecified: Secondary | ICD-10-CM

## 2023-03-22 DIAGNOSIS — L219 Seborrheic dermatitis, unspecified: Secondary | ICD-10-CM | POA: Diagnosis not present

## 2023-03-22 DIAGNOSIS — Z85828 Personal history of other malignant neoplasm of skin: Secondary | ICD-10-CM | POA: Diagnosis not present

## 2023-03-22 DIAGNOSIS — Z0000 Encounter for general adult medical examination without abnormal findings: Secondary | ICD-10-CM | POA: Diagnosis not present

## 2023-03-22 DIAGNOSIS — Z13220 Encounter for screening for lipoid disorders: Secondary | ICD-10-CM | POA: Diagnosis not present

## 2023-03-22 DIAGNOSIS — F410 Panic disorder [episodic paroxysmal anxiety] without agoraphobia: Secondary | ICD-10-CM

## 2023-03-22 DIAGNOSIS — Z79899 Other long term (current) drug therapy: Secondary | ICD-10-CM

## 2023-03-22 DIAGNOSIS — F4010 Social phobia, unspecified: Secondary | ICD-10-CM

## 2023-03-22 HISTORY — DX: Encounter for general adult medical examination without abnormal findings: Z00.00

## 2023-03-22 LAB — CBC
HCT: 38.3 % (ref 36.0–46.0)
Hemoglobin: 12.2 g/dL (ref 12.0–15.0)
MCHC: 31.9 g/dL (ref 30.0–36.0)
MCV: 83 fl (ref 78.0–100.0)
Platelets: 244 10*3/uL (ref 150.0–400.0)
RBC: 4.61 Mil/uL (ref 3.87–5.11)
RDW: 16.2 % — ABNORMAL HIGH (ref 11.5–15.5)
WBC: 6.2 10*3/uL (ref 4.0–10.5)

## 2023-03-22 LAB — LIPID PANEL
Cholesterol: 164 mg/dL (ref 0–200)
HDL: 65.7 mg/dL (ref >39.00)
LDL Cholesterol: 83 mg/dL (ref 0–99)
NonHDL: 98.49
Total CHOL/HDL Ratio: 2
Triglycerides: 76 mg/dL (ref 0.0–149.0)
VLDL: 15.2 mg/dL (ref 0.0–40.0)

## 2023-03-22 LAB — BASIC METABOLIC PANEL
BUN: 12 mg/dL (ref 6–23)
CO2: 26 mEq/L (ref 19–32)
Calcium: 8.7 mg/dL (ref 8.4–10.5)
Chloride: 105 mEq/L (ref 96–112)
Creatinine, Ser: 0.87 mg/dL (ref 0.40–1.20)
GFR: 86.64 mL/min (ref >60.00)
Glucose, Bld: 96 mg/dL (ref 70–99)
Potassium: 4.3 mEq/L (ref 3.5–5.1)
Sodium: 138 mEq/L (ref 135–145)

## 2023-03-22 MED ORDER — ALPRAZOLAM 0.25 MG PO TABS: ORAL_TABLET | ORAL | 0 refills | Status: AC

## 2023-03-22 MED ORDER — PROPRANOLOL HCL 10 MG PO TABS: ORAL_TABLET | ORAL | 0 refills | Status: AC

## 2023-03-22 NOTE — Assessment & Plan Note (Signed)
Continue with dermatologist

## 2023-03-22 NOTE — Assessment & Plan Note (Signed)
Uds and non opiod contract signed  Alprazolam 0.25 mg prn anxiety /panic attack

## 2023-03-22 NOTE — Progress Notes (Signed)
New Patient Office Visit  Subjective:  Patient ID: Mackenzie Crawford, female    DOB: 1986/02/19  Age: 37 y.o. MRN: [id number]  CC:  Chief Complaint  Patient presents with   Establish Care    HPI Mackenzie Crawford is here to establish care as a new patient.  Oriented to practice routines and expectations.  Prior provider was: OBGYN Doreene Burke   Pt is without acute concerns.   chronic concerns:  Menses: still with painful heavy periods, regular and monthly. Have a lot of clots. Has tried OCP in the past but states 'her body does not respond to it well"  Panic disorder: finds this happens more situational. Does find caffeine can trigger this as well. Manageable when it does occur. Has tried sertraline, buspirone, and xanax.  Didn't want to stay on sertraline daily so she d/c. Xanax was only medication that actually helped. She has also tried hydroxyxine. She does find more anticipated anxiety such as going into a meeting.    ROS: Negative unless specifically indicated above in HPI.   Current Outpatient Medications:    ALPRAZolam (XANAX) 0.25 MG tablet, Take one po qd prn anxiety, Disp: 30 tablet, Rfl: 0   clindamycin (CLEOCIN T) 1 % external solution, Apply once or twice daily to affected areas on scalp, Disp: 60 mL, Rfl: 2   clobetasol (TEMOVATE) 0.05 % external solution, Apply 1-2 times daily to aa as needed for itch. Avoid applying to face, groin, and axilla. Use as directed. Long-term use can cause thinning of the skin., Disp: 50 mL, Rfl: 2   MAGNESIUM PO, Take by mouth., Disp: , Rfl:    Probiotic Product (PROBIOTIC-10 PO), Take by mouth., Disp: , Rfl:    propranolol (INDERAL) 10 MG tablet, Take one po 30 -60 minutes prior to anxiety provoking event, Disp: 30 tablet, Rfl: 0   Ruxolitinib Phosphate (OPZELURA) 1.5 % CREA, Apply to affected areas once daily, Disp: 60 g, Rfl: 2 Past Medical History:  Diagnosis Date   Anxiety    Arthritis    knees, started in high school   Atypical  mole 11/07/2022   R buttock, mild atypia   Basal cell carcinoma 04/12/2022   L upper back, [facility] 06/07/2022   Encounter for general adult medical examination without abnormal findings 03/22/2023   Kidney stones    Pelvic pain    Recurrent streptococcal tonsillitis    Skin cancer 2023   Past Surgical History:  Procedure Laterality Date   TONSILLECTOMY AND ADENOIDECTOMY  10/23/2013   WISDOM TOOTH EXTRACTION      Objective:   Today's Vitals: BP 124/86   Pulse 65   Temp 98.2 F (36.8 C) (Rectal)   Ht 5\' 6"  (1.676 m)   Wt 168 lb 8 oz (76.4 kg)   LMP 03/06/2023   SpO2 99%   BMI 27.20 kg/m   Physical Exam Constitutional:      General: She is not in acute distress.    Appearance: Normal appearance. She is normal weight. She is not ill-appearing.  HENT:     Head: Normocephalic.     Right Ear: Tympanic membrane normal.     Left Ear: Tympanic membrane normal.     Nose: Nose normal.     Mouth/Throat:     Mouth: Mucous membranes are moist.  Eyes:     Extraocular Movements: Extraocular movements intact.     Pupils: Pupils are equal, round, and reactive to light.  Cardiovascular:     Rate and Rhythm:  Normal rate and regular rhythm.  Pulmonary:     Effort: Pulmonary effort is normal.     Breath sounds: Normal breath sounds.  Abdominal:     General: Abdomen is flat. Bowel sounds are normal.     Palpations: Abdomen is soft.     Tenderness: There is no guarding or rebound.  Musculoskeletal:        General: Normal range of motion.     Cervical back: Normal range of motion.  Skin:    General: Skin is warm.     Capillary Refill: Capillary refill takes less than 2 seconds.  Neurological:     General: No focal deficit present.     Mental Status: She is alert.  Psychiatric:        Mood and Affect: Mood normal.        Behavior: Behavior normal.        Thought Content: Thought content normal.        Judgment: Judgment normal.     Assessment & Plan:  Seborrheic  dermatitis Assessment & Plan: Continue with dermatologist    History of basal cell carcinoma (BCC) of skin  Social anxiety disorder -     Propranolol HCl; Take one po 30 -60 minutes prior to anxiety provoking event  Dispense: 30 tablet; Refill: 0 -     ALPRAZolam; Take one po qd prn anxiety  Dispense: 30 tablet; Refill: 0 -     DRUG MONITORING, PANEL 8 WITH CONFIRMATION, URINE  Panic disorder Assessment & Plan: Xanax 0.25 mg prn anxiety panic attack Pdmp reviewed Uds ordered today and pending  Non opioid contract signed  Social provoking event propanolol 30-60 minutes prior   Orders: -     Propranolol HCl; Take one po 30 -60 minutes prior to anxiety provoking event  Dispense: 30 tablet; Refill: 0 -     ALPRAZolam; Take one po qd prn anxiety  Dispense: 30 tablet; Refill: 0 -     DRUG MONITORING, PANEL 8 WITH CONFIRMATION, URINE  High risk medication use Assessment & Plan: Uds and non opiod contract signed  Alprazolam 0.25 mg prn anxiety /panic attack    Orders: -     DRUG MONITORING, PANEL 8 WITH CONFIRMATION, URINE  Screening for lipoid disorders -     Lipid panel  Encounter for general adult medical examination without abnormal findings Assessment & Plan: Patient Counseling(The following topics were reviewed):  Preventative care handout given to pt  Health maintenance and immunizations reviewed. Please refer to Health maintenance section. Pt advised on safe sex, wearing seatbelts in car, and proper nutrition labwork ordered today for annual Dental health: Discussed importance of regular tooth brushing, flossing, and dental visits.   Orders: -     Lipid panel -     Basic metabolic panel -     CBC  Dysmenorrhea Assessment & Plan: Stable  Declines ocps doesn't tolerate well      Follow-up: Return in about 1 year (around 03/21/2024) for f/u CPE.   [name], FNP

## 2023-03-22 NOTE — Patient Instructions (Signed)
  Xanax prn anxiety / panic attack Propanolol 30-60 minutes prior to anxiety provoking event.    Stop by the lab prior to leaving today. I will notify you of your results once received.   Recommendations on keeping yourself healthy:  - Exercise at least 30-45 minutes a day, 3-4 days a week.  - Eat a low-fat diet with lots of fruits and vegetables, up to 7-9 servings per day.  - Seatbelts can save your life. Wear them always.  - Smoke detectors on every level of your home, check batteries every year.  - Eye Doctor - have an eye exam every 1-2 years  - Safe sex - if you may be exposed to STDs, use a condom.  - Alcohol -  If you drink, do it moderately, less than 2 drinks per day.  - Health Care Power of Attorney. Choose someone to speak for you if you are not able.  - Depression is common in our stressful world.If you're feeling down or losing interest in things you normally enjoy, please come in for a visit.  - Violence - If anyone is threatening or hurting you, please call immediately.  Due to recent changes in healthcare laws, you may see results of your imaging and/or laboratory studies on MyChart before I have had a chance to review them.  I understand that in some cases there may be results that are confusing or concerning to you. Please understand that not all results are received at the same time and often I may need to interpret multiple results in order to provide you with the best plan of care or course of treatment. Therefore, I ask that you please give me 2 business days to thoroughly review all your results before contacting my office for clarification. Should we see a critical lab result, you will be contacted sooner.   I will see you again in one year for your annual comprehensive exam unless otherwise stated and or with acute concerns.  It was a pleasure seeing you today! Please do not hesitate to reach out with any questions and or concerns.  Regards,   [name]

## 2023-03-22 NOTE — Assessment & Plan Note (Addendum)
Xanax 0.25 mg prn anxiety panic attack Pdmp reviewed Uds ordered today and pending  Non opioid contract signed  Social provoking event propanolol 30-60 minutes prior

## 2023-03-22 NOTE — Assessment & Plan Note (Signed)
Patient Counseling(The following topics were reviewed): ? Preventative care handout given to pt  ?Health maintenance and immunizations reviewed. Please refer to Health maintenance section. ?Pt advised on safe sex, wearing seatbelts in car, and proper nutrition ?labwork ordered today for annual ?Dental health: Discussed importance of regular tooth brushing, flossing, and dental visits. ? ? ?

## 2023-03-22 NOTE — Assessment & Plan Note (Signed)
Stable  Declines ocps doesn't tolerate well

## 2023-03-23 LAB — DRUG MONITORING, PANEL 8 WITH CONFIRMATION, URINE
6 Acetylmorphine: NEGATIVE ng/mL (ref <10)
Alcohol Metabolites: NEGATIVE ng/mL (ref <500)
Amphetamines: NEGATIVE ng/mL (ref <500)
Benzodiazepines: NEGATIVE ng/mL (ref <100)
Buprenorphine, Urine: NEGATIVE ng/mL (ref <5)
Cocaine Metabolite: NEGATIVE ng/mL (ref <150)
Creatinine: 39.6 mg/dL (ref >=20.0)
MDMA: NEGATIVE ng/mL (ref <500)
Marijuana Metabolite: NEGATIVE ng/mL (ref <20)
Opiates: NEGATIVE ng/mL (ref <100)
Oxidant: NEGATIVE ug/mL (ref <200)
Oxycodone: NEGATIVE ng/mL (ref <100)
pH: 7.4 (ref 4.5–9.0)

## 2023-03-23 LAB — DM TEMPLATE

## 2023-04-03 ENCOUNTER — Ambulatory Visit (INDEPENDENT_AMBULATORY_CARE_PROVIDER_SITE_OTHER): Payer: Commercial Managed Care - PPO | Admitting: Internal Medicine

## 2023-04-03 ENCOUNTER — Encounter: Payer: Self-pay | Admitting: Internal Medicine

## 2023-04-03 VITALS — BP 98/78 | HR 70 | Temp 98.0°F | Ht 66.0 in | Wt 172.0 lb

## 2023-04-03 DIAGNOSIS — K625 Hemorrhage of anus and rectum: Secondary | ICD-10-CM

## 2023-04-03 MED ORDER — HYDROCORTISONE 2.5 % EX CREA: TOPICAL_CREAM | Freq: Three times a day (TID) | CUTANEOUS | 1 refills | Status: DC | PRN

## 2023-04-03 NOTE — Progress Notes (Signed)
Subjective:    Patient ID: Mackenzie Crawford, female    DOB: 01-May-1986, 37 y.o.   MRN: [id number]  HPI Here due to rectal bleeding  4 days ago--went to music festival and drank too much Vomited several times Thinks she strained and then noted blood on toilet paper No sig pain  Usually goes every day---hasn't gone in the day or two (just doesn't)  Did wipe today after voiding and saw some blood on paper (though no stool)  Current Outpatient Medications on File Prior to Visit  Medication Sig Dispense Refill   ALPRAZolam (XANAX) 0.25 MG tablet Take one po qd prn anxiety 30 tablet 0   clindamycin (CLEOCIN T) 1 % external solution Apply once or twice daily to affected areas on scalp 60 mL 2   clobetasol (TEMOVATE) 0.05 % external solution Apply 1-2 times daily to aa as needed for itch. Avoid applying to face, groin, and axilla. Use as directed. Long-term use can cause thinning of the skin. 50 mL 2   MAGNESIUM PO Take by mouth.     Probiotic Product (PROBIOTIC-10 PO) Take by mouth.     propranolol (INDERAL) 10 MG tablet Take one po 30 -60 minutes prior to anxiety provoking event 30 tablet 0   Ruxolitinib Phosphate (OPZELURA) 1.5 % CREA Apply to affected areas once daily 60 g 2   No current facility-administered medications on file prior to visit.    No Known Allergies  Past Medical History:  Diagnosis Date   Anxiety    Arthritis    knees, started in high school   Atypical mole 11/07/2022   R buttock, mild atypia   Basal cell carcinoma 04/12/2022   L upper back, [facility] 06/07/2022   Encounter for general adult medical examination without abnormal findings 03/22/2023   Kidney stones    Pelvic pain    Recurrent streptococcal tonsillitis    Skin cancer 2023    Past Surgical History:  Procedure Laterality Date   TONSILLECTOMY AND ADENOIDECTOMY  10/23/2013   WISDOM TOOTH EXTRACTION      Family History  Problem Relation Age of Onset   Breast cancer Mother 51   Kidney Stones Father     Kidney disease Father    Kidney Stones Brother    Pancreatic cancer Maternal Grandmother    Heart disease Maternal Grandfather 36   Arthritis Paternal Grandmother    Diabetes Paternal Grandmother    Hearing loss Paternal Grandmother     Social History   Socioeconomic History   Marital status: Married    Spouse name: Not on file   Number of children: 2   Years of education: Not on file   Highest education level: Not on file  Occupational History    Employer: [employer]    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  Tobacco Use   Smoking status: Former    Types: Cigarettes    Quit date: 11/01/2009    Years since quitting: 13.4   Smokeless tobacco: Never  Vaping Use   Vaping Use: Never used  Substance and Sexual Activity   Alcohol use: Yes    Alcohol/week: 5.0 standard drinks of alcohol    Types: 5 Cans of beer per week    Comment: ocassional wine   Drug use: Not Currently   Sexual activity: Yes    Partners: Female    Birth control/protection: Other-see comments, None    Comment: Husband had visectomy  Other Topics Concern   Not on file  Social History Narrative  Not on file   Social Determinants of Health   Financial Resource Strain: Not on file  Food Insecurity: Not on file  Transportation Needs: Not on file  Physical Activity: Not on file  Stress: Not on file  Social Connections: Not on file  Intimate Partner Violence: Not on file   Review of Systems No abdominal pain No N/V since that day Some question about dad having colon cancer--but not confirmed     Objective:   Physical Exam Genitourinary:    Comments: Small hemorrhoid around 4 o'clock No clear fissure Internal shows no lesions or stool           Assessment & Plan:

## 2023-04-03 NOTE — Assessment & Plan Note (Signed)
Minimal and only on toilet paper after episodes of vomiting (strain) Nothing to indicate internal lesion--so will hold off on further evaluation unless recurs HC 2.5% cream to apply prn

## 2023-04-12 ENCOUNTER — Ambulatory Visit: Payer: PRIVATE HEALTH INSURANCE | Admitting: Dermatology

## 2023-05-22 ENCOUNTER — Encounter: Payer: Self-pay | Admitting: Certified Nurse Midwife

## 2023-05-22 ENCOUNTER — Other Ambulatory Visit (HOSPITAL_COMMUNITY)
Admission: RE | Admit: 2023-05-22 | Discharge: 2023-05-22 | Disposition: A | Discharge status: Home / Self Care | Payer: Commercial Managed Care - PPO | Source: Ambulatory Visit | Attending: Certified Nurse Midwife | Admitting: Certified Nurse Midwife

## 2023-05-22 ENCOUNTER — Ambulatory Visit (INDEPENDENT_AMBULATORY_CARE_PROVIDER_SITE_OTHER): Payer: Commercial Managed Care - PPO | Admitting: Certified Nurse Midwife

## 2023-05-22 VITALS — BP 130/92 | HR 68 | Ht 65.0 in | Wt 166.0 lb

## 2023-05-22 DIAGNOSIS — Z01419 Encounter for gynecological examination (general) (routine) without abnormal findings: Secondary | ICD-10-CM | POA: Diagnosis not present

## 2023-05-22 DIAGNOSIS — Z124 Encounter for screening for malignant neoplasm of cervix: Secondary | ICD-10-CM | POA: Diagnosis present

## 2023-05-22 NOTE — Progress Notes (Signed)
GYNECOLOGY ANNUAL PREVENTATIVE CARE ENCOUNTER NOTE  History:     Mackenzie Crawford is a 37 y.o. G71P2002 female here for a routine annual gynecologic exam.  Current complaints: pt felt nodule on cervix when placing her diva cup.   Denies abnormal vaginal bleeding, discharge, pelvic pain, problems with intercourse or other gynecologic concerns.     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 Contraception: vasectomy Last Pap: 04/2319. Results were: unsatisfactory.   Last mammogram: n/a .   Obstetric History OB History  Gravida Para Term Preterm AB Living  2 2 2     2   SAB IAB Ectopic Multiple Live Births        0 2    # Outcome Date GA Lbr Len/2nd Weight Sex Type Anes PTL Lv  2 Term 10/10/21 [redacted]w[redacted]d 148:55 / 00:09 6 lb 3.1 oz (2.81 kg) F Vag-Spont EPI  LIV  1 Term 05/09/19 [redacted]w[redacted]d 03:27 / 01:15 7 lb 7.2 oz (3.38 kg) M Vag-Spont EPI  LIV    Past Medical History:  Diagnosis Date   Anxiety    Arthritis    knees, started in high school   Atypical mole 11/07/2022   R buttock, mild atypia   Basal cell carcinoma 04/12/2022   L upper back, [facility] 06/07/2022   Encounter for general adult medical examination without abnormal findings 03/22/2023   Kidney stones    Pelvic pain    Recurrent streptococcal tonsillitis    Skin cancer 2023    Past Surgical History:  Procedure Laterality Date   TONSILLECTOMY AND ADENOIDECTOMY  10/23/2013   WISDOM TOOTH EXTRACTION      Current Outpatient Medications on File Prior to Visit  Medication Sig Dispense Refill   ALPRAZolam (XANAX) 0.25 MG tablet Take one po qd prn anxiety 30 tablet 0   clindamycin (CLEOCIN T) 1 % external solution Apply once or twice daily to affected areas on scalp 60 mL 2   clobetasol (TEMOVATE) 0.05 % external solution Apply 1-2 times daily to aa as needed for  itch. Avoid applying to face, groin, and axilla. Use as directed. Long-term use can cause thinning of the skin. 50 mL 2   hydrocortisone 2.5 % cream Apply topically 3 (three) times daily as needed. 28 g 1   MAGNESIUM PO Take by mouth.     Probiotic Product (PROBIOTIC-10 PO) Take by mouth.     propranolol (INDERAL) 10 MG tablet Take one po 30 -60 minutes prior to anxiety provoking event 30 tablet 0   Ruxolitinib Phosphate (OPZELURA) 1.5 % CREA Apply to affected areas once daily 60 g 2   No current facility-administered medications on file prior to visit.    No Known Allergies  Social History:  reports that she quit smoking about 13 years ago. Her smoking use included cigarettes. She has never used smokeless tobacco. She reports current alcohol use of about 5.0 standard drinks of alcohol per week. She reports that she does not currently use drugs.  Family History  Problem Relation Age of Onset   Breast cancer Mother 69   Kidney Stones Father    Kidney disease Father    Kidney Stones Brother    Pancreatic cancer Maternal Grandmother    Heart disease Maternal Grandfather 15   Arthritis Paternal Grandmother    Diabetes Paternal Grandmother  Hearing loss Paternal Grandmother     The following portions of the patient's history were reviewed and updated as appropriate: allergies, current medications, past family history, past medical history, past social history, past surgical history and problem list.  Review of Systems Pertinent items noted in HPI and remainder of comprehensive ROS otherwise negative.  Physical Exam:  Ht 5\' 5"  (1.651 m)   Wt 166 lb (75.3 kg)   LMP 04/02/2023   Breastfeeding No   BMI 27.62 kg/m  CONSTITUTIONAL: Well-developed, well-nourished female in no acute distress.  HENT:  Normocephalic, atraumatic, External right and left ear normal. Oropharynx is clear and moist EYES: Conjunctivae and EOM are normal. Pupils are equal, round, and reactive to light. No  scleral icterus.  NECK: Normal range of motion, supple, no masses.  Normal thyroid.  SKIN: Skin is warm and dry. No rash noted. Not diaphoretic. No erythema. No pallor. MUSCULOSKELETAL: Normal range of motion. No tenderness.  No cyanosis, clubbing, or edema.  2+ distal pulses. NEUROLOGIC: Alert and oriented to person, place, and time. Normal reflexes, muscle tone coordination.  PSYCHIATRIC: Normal mood and affect. Normal behavior. Normal judgment and thought content. CARDIOVASCULAR: Normal heart rate noted, regular rhythm RESPIRATORY: Clear to auscultation bilaterally. Effort and breath sounds normal, no problems with respiration noted. BREASTS: Symmetric in size. No masses, tenderness, skin changes, nipple drainage, or lymphadenopathy bilaterally.  ABDOMEN: Soft, no distention noted.  No tenderness, rebound or guarding.  PELVIC: Normal appearing external genitalia and urethral meatus; normal appearing vaginal mucosa and cervix.  No abnormal discharge noted.  Pap smear obtained.  Contact bleeding. Nabothian cyst noted on at 3 o clock approximately 3 mm in size. White pin point nabothian cyst at approximately 9 o'clock . 1 mm in size, firm on palpation. Normal uterine size, no other palpable masses, no uterine or adnexal tenderness.  .   Assessment and Plan:    1. Encounter for well woman exam  Pap: Will follow up results of pap smear and manage accordingly. Mammogram : none  Labs: none  Refills: none  Referral: none  Routine preventative health maintenance measures emphasized. Please refer to After Visit Summary for other counseling recommendations.      Doreene Burke, CNM [facility]  [facility],  [facility]

## 2023-05-22 NOTE — Patient Instructions (Signed)

## 2023-05-23 ENCOUNTER — Encounter: Payer: Self-pay | Admitting: Certified Nurse Midwife

## 2023-05-25 ENCOUNTER — Encounter: Payer: Self-pay | Admitting: Certified Nurse Midwife

## 2023-06-07 ENCOUNTER — Ambulatory Visit: Payer: PRIVATE HEALTH INSURANCE | Admitting: Dermatology

## 2023-10-09 ENCOUNTER — Encounter: Payer: Self-pay | Admitting: Internal Medicine

## 2023-10-09 ENCOUNTER — Ambulatory Visit (INDEPENDENT_AMBULATORY_CARE_PROVIDER_SITE_OTHER): Payer: Commercial Managed Care - PPO | Admitting: Internal Medicine

## 2023-10-09 VITALS — BP 110/80 | HR 89 | Temp 98.7°F | Ht 65.0 in | Wt 167.0 lb

## 2023-10-09 DIAGNOSIS — B9789 Other viral agents as the cause of diseases classified elsewhere: Secondary | ICD-10-CM | POA: Insufficient documentation

## 2023-10-09 DIAGNOSIS — J988 Other specified respiratory disorders: Secondary | ICD-10-CM | POA: Diagnosis not present

## 2023-10-09 NOTE — Progress Notes (Signed)
Subjective:    Patient ID: Mackenzie Crawford, female    DOB: 1985-11-06, 37 y.o.   MRN: [id number]  HPI Here due to respiratory illness  Was in [place] and returned 6 days ago Started feeling "head cold" 5 days ago Then started with body aches, sweats, etc 3 days ago Awoke in sweat last 2 mornings---and stomach cramping Body aches now better No measured fever Sinus pressure---maxillary --with some pain Congestion and coughing up "chunks of mucus" (brownish yellow) No sore throat or ear pain Some cough No SOB  Got z-pak from doctor at [facility] 4 days ago (finished yesterday) Helped briefly  Used nyquil--helps with sleep Robitussin --helps some  Current Outpatient Medications on File Prior to Visit  Medication Sig Dispense Refill   ALPRAZolam (XANAX) 0.25 MG tablet Take one po qd prn anxiety 30 tablet 0   hydrocortisone 2.5 % cream Apply topically 3 (three) times daily as needed. 28 g 1   MAGNESIUM PO Take by mouth.     Probiotic Product (PROBIOTIC-10 PO) Take by mouth.     propranolol (INDERAL) 10 MG tablet Take one po 30 -60 minutes prior to anxiety provoking event 30 tablet 0   Ruxolitinib Phosphate (OPZELURA) 1.5 % CREA Apply to affected areas once daily 60 g 2   No current facility-administered medications on file prior to visit.    No Known Allergies  Past Medical History:  Diagnosis Date   Anxiety    Arthritis    knees, started in high school   Atypical mole 11/07/2022   R buttock, mild atypia   Basal cell carcinoma 04/12/2022   L upper back, [facility] 06/07/2022   Encounter for general adult medical examination without abnormal findings 03/22/2023   Kidney stones    Pelvic pain    Recurrent streptococcal tonsillitis    Skin cancer 2023    Past Surgical History:  Procedure Laterality Date   TONSILLECTOMY AND ADENOIDECTOMY  10/23/2013   WISDOM TOOTH EXTRACTION      Family History  Problem Relation Age of Onset   Breast cancer Mother 26   Kidney Stones  Father    Kidney disease Father    Kidney Stones Brother    Pancreatic cancer Maternal Grandmother    Heart disease Maternal Grandfather 23   Arthritis Paternal Grandmother    Diabetes Paternal Grandmother    Hearing loss Paternal Grandmother     Social History   Socioeconomic History   Marital status: Married    Spouse name: Not on file   Number of children: 2   Years of education: Not on file   Highest education level: Not on file  Occupational History    Employer: [employer]    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  Tobacco Use   Smoking status: Former    Current packs/day: 0.00    Types: Cigarettes    Quit date: 11/01/2009    Years since quitting: 13.9   Smokeless tobacco: Never  Vaping Use   Vaping status: Never Used  Substance and Sexual Activity   Alcohol use: Yes    Alcohol/week: 5.0 standard drinks of alcohol    Types: 5 Cans of beer per week    Comment: ocassional wine   Drug use: Not Currently   Sexual activity: Yes    Partners: Female    Birth control/protection: Other-see comments, None    Comment: Husband had visectomy  Other Topics Concern   Not on file  Social History Narrative   Not on file  Social Drivers of Corporate investment banker Strain: Not on file  Food Insecurity: Not on file  Transportation Needs: Not on file  Physical Activity: Not on file  Stress: Not on file  Social Connections: Not on file  Intimate Partner Violence: Not on file   Review of Systems No N/V No change in smell or taste Appetite is off--mostly eating soup     Objective:   Physical Exam Constitutional:      Appearance: Normal appearance.  HENT:     Head:     Comments: No sinus tenderness    Right Ear: Tympanic membrane and ear canal normal.     Left Ear: Tympanic membrane and ear canal normal.     Mouth/Throat:     Pharynx: No oropharyngeal exudate or posterior oropharyngeal erythema.  Pulmonary:     Effort: Pulmonary effort is normal.     Breath sounds:  Normal breath sounds. No wheezing or rales.  Musculoskeletal:     Cervical back: Neck supple.  Lymphadenopathy:     Cervical: No cervical adenopathy.  Neurological:     Mental Status: She is alert.            Assessment & Plan:

## 2023-10-09 NOTE — Assessment & Plan Note (Signed)
Prominent systemic symptoms of viral infection Had z-pak in case sinusitis--only brief help Discussed analgesics/robitussin If worsens later this week ---with more productive cough (from sinuses)--will try empiric doxy 100 bid x 7 days

## 2023-10-12 ENCOUNTER — Encounter: Payer: Self-pay | Admitting: Internal Medicine

## 2023-10-12 MED ORDER — DOXYCYCLINE HYCLATE 100 MG PO TABS: 100.0000 mg | ORAL_TABLET | Freq: Two times a day (BID) | ORAL | 0 refills | Status: DC

## 2023-10-12 NOTE — Telephone Encounter (Signed)
I tried to put in that pharmacy and it didn't exist at that address.  Please figure out what is wrong and send her the doxy

## 2023-10-13 ENCOUNTER — Other Ambulatory Visit: Payer: Self-pay | Admitting: Family Medicine

## 2023-10-13 MED ORDER — DOXYCYCLINE HYCLATE 100 MG PO TABS: 100.0000 mg | ORAL_TABLET | Freq: Two times a day (BID) | ORAL | 0 refills | Status: DC

## 2023-10-15 NOTE — Telephone Encounter (Signed)
I spoke with pt; left [place] before getting medication; [facility] here. If anything else needed pt will cb.sending ntoe to [place] [facility].

## 2024-08-07 ENCOUNTER — Ambulatory Visit: Admitting: Certified Nurse Midwife

## 2024-08-07 NOTE — Progress Notes (Signed)
 GYNECOLOGY ANNUAL PREVENTATIVE CARE ENCOUNTER NOTE  History:     Mackenzie Crawford is a 38 y.o. G64P2002 female here for a routine annual gynecologic exam.  Current complaints: pt requesting HSV testing. States partner recently diagnosed and she would like to be test. Also requesting early screening mammogram due to mother having breast cancer diagnosis last year. .   Denies abnormal vaginal bleeding, discharge, pelvic pain, problems with intercourse or other gynecologic concerns.     Social Relationship: married Living: husband,2kids Work: Orthoptist Exercise: yes Smoke/Alcohol/drug use:  Gynecologic History Patient's last menstrual period was 08/08/2024. Contraception: vasectomy Last Pap: 05/22/23. Results were: normal with negative HPV Last mammogram: n/a.  Obstetric History OB History  Gravida Para Term Preterm AB Living  2 2 2   2   SAB IAB Ectopic Multiple Live Births     0 2    # Outcome Date GA Lbr Len/2nd Weight Sex Type Anes PTL Lv  2 Term 10/10/21 [redacted]w[redacted]d 148:55 / 00:09 6 lb 3.1 oz (2.81 kg) F Vag-Spont EPI  LIV  1 Term 05/09/19 [redacted]w[redacted]d 03:27 / 01:15 7 lb 7.2 oz (3.38 kg) M Vag-Spont EPI  LIV    Past Medical History:  Diagnosis Date   Anxiety    Arthritis    knees, started in high school   Atypical mole 11/07/2022   R buttock, mild atypia   Basal cell carcinoma 04/12/2022   L upper back, [facility] 06/07/2022   Encounter for general adult medical examination without abnormal findings 03/22/2023   Kidney stones    Pelvic pain    Recurrent streptococcal tonsillitis    Skin cancer 2023    Past Surgical History:  Procedure Laterality Date   TONSILLECTOMY AND ADENOIDECTOMY  10/23/2013   WISDOM TOOTH EXTRACTION      Current Outpatient Medications on File Prior to Visit  Medication Sig Dispense Refill   ALPRAZolam  (XANAX ) 0.25 MG tablet Take one po qd prn anxiety 30 tablet 0   Probiotic Product (PROBIOTIC-10 PO) Take by mouth.     propranolol  (INDERAL ) 10 MG  tablet Take one po 30 -60 minutes prior to anxiety provoking event 30 tablet 0   Ruxolitinib Phosphate  (OPZELURA ) 1.5 % CREA Apply to affected areas once daily 60 g 2   doxycycline  (VIBRA -TABS) 100 MG tablet Take 1 tablet (100 mg total) by mouth 2 (two) times daily. 14 tablet 0   hydrocortisone  2.5 % cream Apply topically 3 (three) times daily as needed. 28 g 1   MAGNESIUM PO Take by mouth.     No current facility-administered medications on file prior to visit.    No Known Allergies  Social History:  reports that she quit smoking about 14 years ago. Her smoking use included cigarettes. She has never used smokeless tobacco. She reports current alcohol use of about 5.0 standard drinks of alcohol per week. She reports that she does not currently use drugs.  Family History  Problem Relation Age of Onset   Breast cancer Mother 49   Kidney Stones Father    Kidney disease Father    Kidney Stones Brother    Pancreatic cancer Maternal Grandmother    Heart disease Maternal Grandfather 30   Arthritis Paternal Grandmother    Diabetes Paternal Grandmother    Hearing loss Paternal Grandmother     The following portions of the patient's history were reviewed and updated as appropriate: allergies, current medications, past family history, past medical history, past social history, past  surgical history and problem list.  Review of Systems Pertinent items noted in HPI and remainder of comprehensive ROS otherwise negative.  Physical Exam:  BP 122/83   Pulse 67   Ht 5' 5 (1.651 m)   Wt 167 lb (75.8 kg)   LMP 08/08/2024   BMI 27.79 kg/m  CONSTITUTIONAL: Well-developed, well-nourished female in no acute distress.  HENT:  Normocephalic, atraumatic, External right and left ear normal. Oropharynx is clear and moist EYES: Conjunctivae and EOM are normal. Pupils are equal, round, and reactive to light. No scleral icterus.  NECK: Normal range of motion, supple, no masses.  Normal thyroid .  SKIN:  Skin is warm and dry. No rash noted. Not diaphoretic. No erythema. No pallor. MUSCULOSKELETAL: Normal range of motion. No tenderness.  No cyanosis, clubbing, or edema.  2+ distal pulses. NEUROLOGIC: Alert and oriented to person, place, and time. Normal reflexes, muscle tone coordination.  PSYCHIATRIC: Normal mood and affect. Normal behavior. Normal judgment and thought content. CARDIOVASCULAR: Normal heart rate noted, regular rhythm RESPIRATORY: Clear to auscultation bilaterally. Effort and breath sounds normal, no problems with respiration noted. BREASTS: Symmetric in size. No masses, tenderness, skin changes, nipple drainage, or lymphadenopathy bilaterally.  ABDOMEN: Soft, no distention noted.  No tenderness, rebound or guarding.  PELVIC: Normal appearing external genitalia and urethral meatus; normal appearing vaginal mucosa and cervix.  No abnormal discharge noted.  Pt on her period. Blood present. Pap smear obtained.  Normal uterine size, no other palpable masses, no uterine or adnexal tenderness.  .   Assessment and Plan:  Annual Well Women GYN Exam   Pap:Will follow up results of pap smear and manage accordingly. Mammogram : ordered early screening. Pt mother breast cancer  Labs: HSV  Refills: none  Referral: none Discussed BCRA gene testing. She is going to find out about insurance coverage.  Routine preventative health maintenance measures emphasized. Please refer to After Visit Summary for other counseling recommendations.      Zelda Hummer, CNM [facility]  [facility],  [facility]

## 2024-08-07 NOTE — Patient Instructions (Signed)

## 2024-08-08 ENCOUNTER — Ambulatory Visit: Admitting: Certified Nurse Midwife

## 2024-08-08 ENCOUNTER — Other Ambulatory Visit (HOSPITAL_COMMUNITY)
Admission: RE | Admit: 2024-08-08 | Discharge: 2024-08-08 | Disposition: A | Discharge status: Home / Self Care | Source: Ambulatory Visit | Attending: Certified Nurse Midwife | Admitting: Certified Nurse Midwife

## 2024-08-08 ENCOUNTER — Encounter: Payer: Self-pay | Admitting: Certified Nurse Midwife

## 2024-08-08 VITALS — BP 122/83 | HR 67 | Ht 65.0 in | Wt 167.0 lb

## 2024-08-08 DIAGNOSIS — Z124 Encounter for screening for malignant neoplasm of cervix: Secondary | ICD-10-CM | POA: Diagnosis present

## 2024-08-08 DIAGNOSIS — R87619 Unspecified abnormal cytological findings in specimens from cervix uteri: Secondary | ICD-10-CM | POA: Diagnosis not present

## 2024-08-08 DIAGNOSIS — Z01419 Encounter for gynecological examination (general) (routine) without abnormal findings: Secondary | ICD-10-CM | POA: Diagnosis present

## 2024-08-08 DIAGNOSIS — Z803 Family history of malignant neoplasm of breast: Secondary | ICD-10-CM

## 2024-08-08 DIAGNOSIS — Z1231 Encounter for screening mammogram for malignant neoplasm of breast: Secondary | ICD-10-CM

## 2024-08-09 LAB — HSV 1 AND 2 AB, IGG
HSV 1 Glycoprotein G Ab, IgG: NONREACTIVE
HSV 2 IgG, Type Spec: NONREACTIVE

## 2024-08-11 ENCOUNTER — Encounter: Payer: Self-pay | Admitting: Certified Nurse Midwife

## 2024-08-18 LAB — CYTOLOGY - PAP
Comment: NEGATIVE
Diagnosis: NEGATIVE
High risk HPV: NEGATIVE
